# Patient Record
Sex: Male | Born: 1955 | Race: White | Hispanic: No | Smoking: Never smoker
Health system: Southern US, Community
[De-identification: ages and names within clinical notes are randomized; demographics above are authoritative.]

## PROBLEM LIST (undated history)

## (undated) DIAGNOSIS — I4891 Unspecified atrial fibrillation: Secondary | ICD-10-CM

## (undated) DIAGNOSIS — E079 Disorder of thyroid, unspecified: Secondary | ICD-10-CM

## (undated) DIAGNOSIS — E559 Vitamin D deficiency, unspecified: Secondary | ICD-10-CM

## (undated) DIAGNOSIS — E785 Hyperlipidemia, unspecified: Secondary | ICD-10-CM

## (undated) DIAGNOSIS — G4733 Obstructive sleep apnea (adult) (pediatric): Secondary | ICD-10-CM

## (undated) HISTORY — PX: TONSILLECTOMY: SUR1361

## (undated) HISTORY — PX: EYE SURGERY: SHX253

---

## 2008-12-16 LAB — HM COLONOSCOPY

## 2014-09-14 ENCOUNTER — Emergency Department
Admission: EM | Admit: 2014-09-14 | Discharge: 2014-09-14 | Disposition: A | Discharge status: Home / Self Care | Payer: Federal, State, Local not specified - PPO | Source: Home / Self Care | Attending: Emergency Medicine | Admitting: Emergency Medicine

## 2014-09-14 ENCOUNTER — Encounter: Payer: Self-pay | Admitting: *Deleted

## 2014-09-14 DIAGNOSIS — R42 Dizziness and giddiness: Secondary | ICD-10-CM

## 2014-09-14 DIAGNOSIS — J301 Allergic rhinitis due to pollen: Secondary | ICD-10-CM | POA: Diagnosis not present

## 2014-09-14 HISTORY — DX: Disorder of thyroid, unspecified: E07.9

## 2014-09-14 HISTORY — DX: Hyperlipidemia, unspecified: E78.5

## 2014-09-14 MED ORDER — PREDNISONE 50 MG PO TABS: 50.0000 mg | ORAL_TABLET | Freq: Every day | ORAL | Status: DC

## 2014-09-14 NOTE — ED Notes (Signed)
Pt c/o sneezing, nasal congestion x 4 days. Lst night had episode of dizziness and heart racing. Resolved today.

## 2014-09-14 NOTE — ED Provider Notes (Signed)
CSN: [id number]     Arrival date & time 09/14/14  1514  [facility]   First MD Initiated Contact with Patient 09/14/14 1515     Chief Complaint  Patient presents with  . Nasal Congestion  . Dizziness    HPI He has chronic seasonal sinus allergies, but complains of a severe flareup of seasonal allergies for 4 days.  Complains of sneezing, nasal congestion, watery rhinorrhea and postnasal drainage and occasional nonproductive cough from the postnasal drainage. No chest pain or shortness of breath or chest congestion. Last night, had a vague episode while sitting down of feeling his heart racing and then lightheadedness when standing up. No nausea or vomiting or chest pain or focal neurologic symptoms. No problems talking or seeing or moving. No numbness or tingling or focal weakness. No fever or chills. No discolored rhinorrhea. No tick bite or rash. He states his PCP recently retired, and he plans to establish with PCP at [name] Past Medical History  Diagnosis Date  . Hyperlipidemia   . Thyroid disease    Past Surgical History  Procedure Laterality Date  . Tonsillectomy     Family History  Problem Relation Age of Onset  . Heart disease Mother   . Cancer Brother     throat CA   History  Substance Use Topics  . Smoking status: Never Smoker   . Smokeless tobacco: Never Used  . Alcohol Use: Yes    Review of Systems  All other systems reviewed and are negative.   Allergies  Penicillins  Home Medications   Prior to Admission medications   Medication Sig Start Date End Date Taking? Authorizing Provider  atorvastatin (LIPITOR) 20 MG tablet Take 20 mg by mouth daily.   Yes Historical Provider, MD  cetirizine (ZYRTEC) 10 MG tablet Take 10 mg by mouth daily.   Yes Historical Provider, MD  levothyroxine (SYNTHROID, LEVOTHROID) 100 MCG tablet Take 100 mcg by mouth daily before breakfast.   Yes Historical Provider, MD  predniSONE  (DELTASONE) 50 MG tablet Take 1 tablet (50 mg total) by mouth daily. With food for 5 days. 09/14/14   Lajean Manesavid Massey, MD   BP 116/75 mmHg  Pulse 81  Temp(Src) 98.3 F (36.8 C) (Oral)  Resp 16  Ht 6' (1.829 m)  Wt 222 lb (100.699 kg)  BMI 30.10 kg/m2  SpO2 96% Physical Exam  Constitutional: He is oriented to person, place, and time. He appears well-developed and well-nourished. No distress.  HENT:  Head: Normocephalic and atraumatic.  Right Ear: External ear normal.  Left Ear: External ear normal.  Mouth/Throat: Oropharynx is clear and moist.  Both ear canals have minimal wax. Otherwise ears and TMs normal. Nose: Boggy pale turbinates, serous drainage. No maxillary tenderness Pharynx clear with mild lymphoid hyperplasia and serous postnasal drainage  Eyes: Conjunctivae and EOM are normal. Pupils are equal, round, and reactive to light. No scleral icterus.  Neck: Normal range of motion. No JVD present. No tracheal deviation present.  Cardiovascular: Normal rate, regular rhythm, normal heart sounds and intact distal pulses.  Exam reveals no gallop and no friction rub.   No murmur heard. Pulmonary/Chest: Effort normal and breath sounds normal. No respiratory distress. He has no wheezes. He has no rales.  Abdominal: He exhibits no distension.  Musculoskeletal: Normal range of motion.  Lymphadenopathy:    He has no cervical adenopathy.  Neurological: He is alert and oriented to person, place, and time. He has  normal reflexes. No cranial nerve deficit. He exhibits normal muscle tone. Coordination normal.  Romberg negative  Skin: Skin is warm. No rash noted. He is not diaphoretic.  Psychiatric: He has a normal mood and affect. His behavior is normal. Judgment and thought content normal.  Nursing note and vitals reviewed.   ED Course  Procedures (including critical care time) Labs Review Labs Reviewed - No data to display  Imaging Review No results found.   MDM   1. Allergic  rhinitis due to pollen   2. Dizziness    The dizziness episode yesterday has resolved. No current dizziness. No evidence of acute cardiorespiratory or neurologic event. Likely, symptoms related to severe sinus allergies. No evidence of sinus infection.  Treatment options discussed, as well as risks, benefits, alternatives. Patient voiced understanding and agreement with the following plans: Prednisone 50 mg by mouth daily 5 days. Continue the Zyrtec 10 mg daily. Start Flonase 1 or 2 sprays each nostril twice a day 7 days then decrease to once daily.--He declined prescription and prefers to purchase this OTC. Follow-up with your new primary care doctor in 5-7 days if not improving, or sooner if symptoms become worse. Precautions discussed. Red flags discussed. Questions invited and answered. Patient voiced understanding and agreement.     Lajean Manes, MD 09/14/14 1600

## 2014-09-21 ENCOUNTER — Encounter: Payer: Self-pay | Admitting: Sports Medicine

## 2014-09-21 ENCOUNTER — Ambulatory Visit (INDEPENDENT_AMBULATORY_CARE_PROVIDER_SITE_OTHER): Payer: Federal, State, Local not specified - PPO

## 2014-09-21 ENCOUNTER — Ambulatory Visit (INDEPENDENT_AMBULATORY_CARE_PROVIDER_SITE_OTHER): Payer: Federal, State, Local not specified - PPO | Admitting: Sports Medicine

## 2014-09-21 VITALS — BP 122/76 | HR 77 | Ht 72.0 in | Wt 226.0 lb

## 2014-09-21 DIAGNOSIS — M545 Low back pain: Secondary | ICD-10-CM

## 2014-09-21 DIAGNOSIS — M51369 Other intervertebral disc degeneration, lumbar region without mention of lumbar back pain or lower extremity pain: Secondary | ICD-10-CM | POA: Insufficient documentation

## 2014-09-21 DIAGNOSIS — M5136 Other intervertebral disc degeneration, lumbar region: Secondary | ICD-10-CM | POA: Insufficient documentation

## 2014-09-21 MED ORDER — METHYLPREDNISOLONE SODIUM SUCC 125 MG IJ SOLR
125.0000 mg | Freq: Once | INTRAMUSCULAR | Status: AC
  Administered 2014-09-21: 125 mg via INTRAMUSCULAR

## 2014-09-21 MED ORDER — KETOROLAC TROMETHAMINE 30 MG/ML IJ SOLN
30.0000 mg | Freq: Once | INTRAMUSCULAR | Status: AC
  Administered 2014-09-21: 30 mg via INTRAMUSCULAR

## 2014-09-21 MED ORDER — AMITRIPTYLINE HCL 50 MG PO TABS: ORAL_TABLET | ORAL | Status: DC

## 2014-09-21 NOTE — Assessment & Plan Note (Signed)
Likely discogenic back pain, left-sided. Solu-Medrol 125, Toradol 30, Mobic, amitriptyline at bedtime, formal physical therapy, x-rays. Return in one month, MRI if no better.

## 2014-09-21 NOTE — Progress Notes (Signed)
   Subjective:    I'm seeing this patient as a consultation for:   Dr. Lajean Manesavid Massey  CC: Low back pain  HPI: Last week this pleasant 59 year old male bent forward to pull on his shoe, felt a pop in his back and had immediate pain, severe, persistent, left-sided without radiculopathy. Worse with sitting in flexion. He was on some prednisone for an allergic complaint without any improvement. No bowel or bladder dysfunction, saddle numbness or constitutional symptoms.  Past medical history, Surgical history, Family history not pertinant except as noted below, Social history, Allergies, and medications have been entered into the medical record, reviewed, and no changes needed.   Review of Systems: No headache, visual changes, nausea, vomiting, diarrhea, constipation, dizziness, abdominal pain, skin rash, fevers, chills, night sweats, weight loss, swollen lymph nodes, body aches, joint swelling, muscle aches, chest pain, shortness of breath, mood changes, visual or auditory hallucinations.   Objective:   General: Well Developed, well nourished, and in no acute distress.  Neuro/Psych: Alert and oriented x3, extra-ocular muscles intact, able to move all 4 extremities, sensation grossly intact. Skin: Warm and dry, no rashes noted.  Respiratory: Not using accessory muscles, speaking in full sentences, trachea midline.  Cardiovascular: Pulses palpable, no extremity edema. Abdomen: Does not appear distended. Back Exam:  Inspection: Unremarkable  Motion: Flexion 45 deg, Extension 45 deg, Side Bending to 45 deg bilaterally,  Rotation to 45 deg bilaterally  SLR laying: Negative  XSLR laying: Negative  Palpable tenderness: None. FABER: negative. Sensory change: Gross sensation intact to all lumbar and sacral dermatomes.  Reflexes: 2+ at both patellar tendons, 2+ at achilles tendons, Babinski's downgoing.  Strength at foot  Plantar-flexion: 5/5 Dorsi-flexion: 5/5 Eversion: 5/5 Inversion: 5/5  Leg  strength  Quad: 5/5 Hamstring: 5/5 Hip flexor: 5/5 Hip abductors: 5/5  Gait unremarkable.  Impression and Recommendations:   This case required medical decision making of moderate complexity.

## 2014-09-23 ENCOUNTER — Ambulatory Visit (INDEPENDENT_AMBULATORY_CARE_PROVIDER_SITE_OTHER): Payer: Federal, State, Local not specified - PPO | Admitting: Physical Therapy

## 2014-09-23 ENCOUNTER — Encounter: Payer: Self-pay | Admitting: Physical Therapy

## 2014-09-23 DIAGNOSIS — R29898 Other symptoms and signs involving the musculoskeletal system: Secondary | ICD-10-CM | POA: Diagnosis not present

## 2014-09-23 DIAGNOSIS — M545 Low back pain, unspecified: Secondary | ICD-10-CM

## 2014-09-23 DIAGNOSIS — M2560 Stiffness of unspecified joint, not elsewhere classified: Secondary | ICD-10-CM | POA: Diagnosis not present

## 2014-09-23 NOTE — Therapy (Addendum)
[place], [place], [address] Phone: [phone]   Fax:  [phone]  Physical Therapy Evaluation  Patient Details  Name: William Morton MRN: [id number] Date of Birth: 05-11-1955 Referring Provider:  Silverio Decamp,*  Encounter Date: 09/23/2014      PT End of Session - 09/27/14 0830    Visit Number 2   Number of Visits 8   Date for PT Re-Evaluation 10/21/14   PT Start Time 0802   Activity Tolerance Patient limited by fatigue      Past Medical History  Diagnosis Date  . Hyperlipidemia   . Thyroid disease     Past Surgical History  Procedure Laterality Date  . Tonsillectomy      There were no vitals filed for this visit.  Visit Diagnosis:  Pain of lumbar spine - Plan: PT plan of care cert/re-cert  Weakness of left hip - Plan: PT plan of care cert/re-cert  Stiffness of joints, multiple sites - Plan: PT plan of care cert/re-cert      Subjective Assessment - 09/27/14 0808    Subjective Pt reports he is feeling better, however he is being very careful out of caution   Patient Stated Goals wishes to return to his prior level of function without pain. Work his physical jobs, Animal nutritionist.    Currently in Pain? No/denies  has not had pain for atleast 2 days.            [facility] PT Assessment - 09/27/14 0001    Assessment   Medical Diagnosis low back pain without sciatica   Onset Date/Surgical Date 09/16/14   Hand Dominance Right   Next MD Visit 10/20/14   Prior Therapy none   Strength   Right Hip Extension 5/5   Left Hip Extension 5/5   Palpation   Spinal mobility good mobility today with no pain in lower lumbar spine                   [facility] Adult PT Treatment/Exercise - 09/27/14 0001    Lumbar Exercises: Stretches   Passive Hamstring Stretch 2 reps  45 secs   Lower Trunk Rotation --  10 reps   Lumbar Exercises: Aerobic   UBE (Upper Arm Bike)  standing L3 x 2'/3'   Lumbar Exercises: Supine   Other Supine Lumbar Exercises pelvic press series 10 reps each per handout                PT Education - 09/27/14 0827    Education provided Yes   Education Details HEP pelvic press series   Person(s) Educated Patient   Methods Explanation;Handout   Comprehension Verbalized understanding;Returned demonstration             PT Long Term Goals - 09/27/14 0831    PT LONG TERM GOAL #1   Title I with advanced HEP ( 78/16)   Status On-going   PT LONG TERM GOAL #2   Title transition sit to/from stand without pain and no need for UE assist (10/21/14)   Status Achieved   PT LONG TERM GOAL #3   Title lift 50# mid shin to waist without difficulty for his job   Status On-going   PT LONG TERM GOAL #4   Title slide 50# at waist level side to side without difficulty for his job (10/21/14)   Status On-going   PT LONG TERM GOAL #5   Title improve FOTO =/< 30% limited (10/21/14)  Status On-going   PT LONG TERM GOAL #6   Title increase strength Lt hip extension =/> 5-/5    Status Achieved               Plan - 09/27/14 0832    Clinical Impression Statement Pt has responded very well to his stretching exercise. He has good reduction of pain, weaned off medication and met two of his goals.  He is scheduled to return to work on Thursday and will try to take it easy.    Pt will benefit from skilled therapeutic intervention in order to improve on the following deficits Pain;Decreased strength;Decreased mobility;Postural dysfunction   Rehab Potential Excellent   PT Frequency 2x / week   PT Duration 4 weeks   PT Treatment/Interventions Moist Heat;Ultrasound;Cryotherapy;Electrical Stimulation;Patient/family education;Manual techniques;Therapeutic exercise   PT Next Visit Plan progress lifting, core strengthening   Consulted and Agree with Plan of Care Patient         Problem List Patient Active Problem List   Diagnosis Date  Noted  . Low back pain 09/21/2014    Jeral Pinch, PT 09/27/2014, 8:44 AM  [facility] [place], [place], [address] Phone: [phone]   Fax:  [phone]

## 2014-09-23 NOTE — Patient Instructions (Addendum)
Lower Trunk Rotation Stretch   [place] [id number]   Keeping back flat and feet together, rotate knees to left side. Hold __1__ seconds. Repeat _10___ times per set. Do __1__ sets per session. Do _1-2___ sessions per day.  Knee-to-Chest Stretch: Unilateral   With hand behind right knee, pull knee in to chest until a comfortable stretch is felt in lower back and buttocks. Keep back relaxed. Hold _30___ seconds. Repeat __1__ times per set. Do __1__ sets per session. Do _1-2___ sessions per day.    Outer Hip Stretch: Reclined IT Band Stretch (Strap)   Strap around opposite foot, pull across only as far as possible with shoulders on mat. Hold for __10__ breaths. Repeat _1-2___ times each leg. One to two times a day  Copyright  VHI. All rights reserved.  Log Roll   Lying on back, bend left knee and place left arm across chest. Roll all in one movement to the right. Reverse to roll to the left. Always move as one unit.   Copyright  VHI. All rights reserved.  Getting Into / Out of Bed   Lower self to lie down on one side by raising legs and lowering head at the same time. Use arms to assist moving without twisting. Bend both knees to roll onto back if desired. To sit up, start from lying on side, and use same move-ments in reverse. Keep trunk aligned with legs.   Copyright  VHI. All rights reserved.

## 2014-09-27 ENCOUNTER — Ambulatory Visit (INDEPENDENT_AMBULATORY_CARE_PROVIDER_SITE_OTHER): Payer: Federal, State, Local not specified - PPO | Admitting: Physical Therapy

## 2014-09-27 ENCOUNTER — Encounter: Payer: Self-pay | Admitting: Physical Therapy

## 2014-09-27 DIAGNOSIS — M545 Low back pain, unspecified: Secondary | ICD-10-CM

## 2014-09-27 DIAGNOSIS — R29898 Other symptoms and signs involving the musculoskeletal system: Secondary | ICD-10-CM

## 2014-09-27 DIAGNOSIS — M2560 Stiffness of unspecified joint, not elsewhere classified: Secondary | ICD-10-CM | POA: Diagnosis not present

## 2014-09-27 NOTE — Therapy (Signed)
[place], [place], [address] Phone: [phone]   Fax:  [phone]  Physical Therapy Treatment  Patient Details  Name: William Morton MRN: [id number] Date of Birth: January 19, 1956 Referring Provider:  Silverio Decamp,*  Encounter Date: 09/27/2014      PT End of Session - 09/27/14 0830    Visit Number 2   Number of Visits 8   Date for PT Re-Evaluation 10/21/14   PT Start Time 0802   PT Stop Time 0848   PT Time Calculation (min) 46 min   Activity Tolerance Patient limited by fatigue      Past Medical History  Diagnosis Date  . Hyperlipidemia   . Thyroid disease     Past Surgical History  Procedure Laterality Date  . Tonsillectomy      There were no vitals filed for this visit.  Visit Diagnosis:  Pain of lumbar spine  Weakness of left hip  Stiffness of joints, multiple sites      Subjective Assessment - 09/27/14 0808    Subjective Pt reports he is feeling better, however he is being very careful out of caution   Patient Stated Goals wishes to return to his prior level of function without pain. Work his physical jobs, Animal nutritionist.    Currently in Pain? No/denies  has not had pain for atleast 2 days.            [facility] PT Assessment - 09/27/14 0001    Assessment   Medical Diagnosis low back pain without sciatica   Onset Date/Surgical Date 09/16/14   Hand Dominance Right   Next MD Visit 10/20/14   Prior Therapy none   Strength   Right Hip Extension 5/5   Left Hip Extension 5/5   Palpation   Spinal mobility good mobility today with no pain in lower lumbar spine                     [facility] Adult PT Treatment/Exercise - 09/27/14 0001    Lumbar Exercises: Stretches   Passive Hamstring Stretch 2 reps  45 secs   Lower Trunk Rotation --  10 reps   Lumbar Exercises: Aerobic   UBE (Upper Arm Bike) standing L3 x 2'/3'   Lumbar Exercises: Supine   Other  Supine Lumbar Exercises pelvic press series 10 reps each per handout                PT Education - 09/27/14 0827    Education provided Yes   Education Details HEP pelvic press series, reviewed body mechanics and hinging at the hips   Person(s) Educated Patient   Methods Explanation;Handout   Comprehension Verbalized understanding;Returned demonstration             PT Long Term Goals - 09/27/14 0831    PT LONG TERM GOAL #1   Title I with advanced HEP ( 78/16)   Status On-going   PT LONG TERM GOAL #2   Title transition sit to/from stand without pain and no need for UE assist (10/21/14)   Status Achieved   PT LONG TERM GOAL #3   Title lift 50# mid shin to waist without difficulty for his job   Status On-going   PT LONG TERM GOAL #4   Title slide 50# at waist level side to side without difficulty for his job (10/21/14)   Status On-going   PT LONG TERM GOAL #5   Title improve FOTO =/<  30% limited (10/21/14)   Status On-going   PT LONG TERM GOAL #6   Title increase strength Lt hip extension =/> 5-/5    Status Achieved               Plan - 09/27/14 0832    Clinical Impression Statement Pt has responded very well to his stretching exercise. He has good reduction of pain, weaned off medication and met two of his goals.  He is scheduled to return to work on Thursday and will try to take it easy.  Did not need heat and stim today.    Pt will benefit from skilled therapeutic intervention in order to improve on the following deficits Pain;Decreased strength;Decreased mobility;Postural dysfunction   Rehab Potential Excellent   PT Frequency 2x / week   PT Duration 4 weeks   PT Treatment/Interventions Moist Heat;Ultrasound;Cryotherapy;Electrical Stimulation;Patient/family education;Manual techniques;Therapeutic exercise   PT Next Visit Plan progress lifting, core strengthening, see how he feels after returning to work   [employer] and Agree with Plan of Care Patient         Problem List Patient Active Problem List   Diagnosis Date Noted  . Low back pain 09/21/2014    Jeral Pinch, PT 09/27/2014, 8:49 AM  [facility] [place], [place], [address] Phone: [phone]   Fax:  [phone]

## 2014-09-27 NOTE — Patient Instructions (Signed)
Pelvic Press   [place] [phone]   Place hands under belly between navel and pubic bone, palms up. Feel pressure on hands. Increase pressure on hands by pressing pelvis down. This is NOT a pelvic tilt. Hold __5_ seconds. Relax. Repeat _10__ times. Once a day  Hamstring Curl - Beginner   Prone with head on forearms, press down with hips. Bend knees  as far as you can maintain pelvic press and return them down. Repeat __10__ times. Once a day.   Copyright  VHI. All rights reserved.  Leg Lift: One-Leg   Press pelvis down. Keep knee straight; lengthen and lift one leg (from waist). Do not twist body. Keep other leg down. Hold _1__ seconds. Relax. Repeat 10 times. Repeat with other leg  Shoulder Blade Squeeze: Arms at Sides   Arms at sides, parallel, elbows straight, palms up. Press pelvis down. Squeeze backbone with shoulder blades, raising front of shoulders, chest, and arms. Keep head and neck neutral. Repeat 5-10 times. Once a day.  Shoulder Blade Squeeze: Fingers Interlaced   Fingers interlaced behind your body, palms facing each other. Press pelvis down. Squeeze backbone with shoulder blades. Raise front of shoulders, chest, and head. Keep neck neutral. Hold _1__ seconds. Relax. Repeat _5-10__ times. Once a day  Shoulder Blade Squeeze: W   Arms out to sides at 90 palms down. Bend elbows to 90. Press pelvis down. Squeeze backbone with shoulder blades. Raise arms, front of shoulders, chest, and head. Keep neck neutral. Hold __1_ seconds. Relax. Repeat 5-10___ times. Once a day  Copyright  VHI. All rights reserved.

## 2014-09-30 ENCOUNTER — Ambulatory Visit (INDEPENDENT_AMBULATORY_CARE_PROVIDER_SITE_OTHER): Payer: Federal, State, Local not specified - PPO | Admitting: Physical Therapy

## 2014-09-30 DIAGNOSIS — M545 Low back pain, unspecified: Secondary | ICD-10-CM

## 2014-09-30 DIAGNOSIS — M2560 Stiffness of unspecified joint, not elsewhere classified: Secondary | ICD-10-CM | POA: Diagnosis not present

## 2014-09-30 DIAGNOSIS — R29898 Other symptoms and signs involving the musculoskeletal system: Secondary | ICD-10-CM

## 2014-09-30 NOTE — Patient Instructions (Signed)
  Abdominal Bracing With Pelvic Floor (Hook-Lying)   With neutral spine, tighten pelvic floor and abdominals. Hold 10 seconds. Repeat __10_ times. Do _1__ times a day.   Knee to Chest: Transverse Plane Stability   Bring one knee up, then return. Be sure pelvis does not roll side to side. Keep pelvis still. Lift knee __10_ times each leg. Restabilize pelvis. Repeat with other leg. Do _1-2__ sets, _1__ times per day.   Hip External Rotation With Pillow: Transverse Plane Stability   One knee bent, one leg straight, on pillow. Slowly roll bent knee out. Be sure pelvis does not rotate. Do _10__ times. Restabilize pelvis. Repeat with other leg. Do _1-2__ sets, _1__ times per day.  Heel Slide: 4-10 Inches - Transverse Plane Stability   Slide heel 4 inches down. Be sure pelvis does not rotate. Do _10__ times. Restabilize pelvis. Repeat with other leg. Do __1_ sets, _1__ times per day.   [facility] [address] [place],  [address]  [phone] (office) [phone] (fax)   

## 2014-09-30 NOTE — Therapy (Signed)
[facility] [place] [address] [place], [place], [address] Phone: [phone]   Fax:  [phone]  Physical Therapy Treatment  Patient Details  Name: William Morton MRN: [id number] Date of Birth: 26-Mar-1956 Referring Provider:  Monica Becton,*  Encounter Date: 09/30/2014      PT End of Session - 09/30/14 0843    Visit Number 3   Number of Visits 8   Date for PT Re-Evaluation 10/21/14   PT Start Time 0844   PT Stop Time 0930   PT Time Calculation (min) 46 min   Activity Tolerance No increased pain;Patient tolerated treatment well      Past Medical History  Diagnosis Date  . Hyperlipidemia   . Thyroid disease     Past Surgical History  Procedure Laterality Date  . Tonsillectomy      There were no vitals filed for this visit.  Visit Diagnosis:  Stiffness of joints, multiple sites  Weakness of left hip  Pain of lumbar spine      Subjective Assessment - 09/30/14 0845    Subjective Pt returned to work yesterday, worked for 12 hrs. Was very cautious; only picked up 15#. No exercises yesterday, just stretched before exited bed this morning.    Currently in Pain? No/denies  stiffness.             [facility] PT Assessment - 09/30/14 0001    Assessment   Medical Diagnosis low back pain without sciatica   Onset Date/Surgical Date 09/16/14   Hand Dominance Right   Next MD Visit 10/20/14   Prior Therapy none                     [facility] Adult PT Treatment/Exercise - 09/30/14 0001    Lumbar Exercises: Stretches   Passive Hamstring Stretch 2 reps;30 seconds  each leg   Lower Trunk Rotation --  10 reps   ITB Stretch 30 seconds  with strap cross body   Lumbar Exercises: Aerobic   Stationary Bike NuStep L 4: 5 min    Lumbar Exercises: Standing   Other Standing Lumbar Exercises shin to waist box lift 20 # x 2, 47# x 2.  Waist to waist box transfer 20# x 2, 47# x 2.  Box slide  37ft 20# x 2, 47# x  2. - no pain with any of these movements.  Pt return demo of good form/ mechanics. VC to engage core.    Lumbar Exercises: Supine   Ab Set 5 reps;5 seconds   Clam 10 reps  with ab set   Heel Slides 5 reps  each leg, with ab set   Bent Knee Raise 10 reps  with abd set   Lumbar Exercises: Prone   Other Prone Lumbar Exercises Prone pelvic press 5 sec hold x 5 (required multiple tactile cues);  press withunilateral knee flexion x 5 reps each leg; press with Bilat knee flex x 5; press with hip extension x 5 reps each leg.                 PT Education - 09/30/14 0927    Education provided Yes   Education Details HEP, added trans abd series.  Reviewed body mechanics.    Person(s) Educated Patient   Methods Handout;Explanation   Comprehension Verbalized understanding;Returned demonstration             PT Long Term Goals - 09/27/14 0831    PT LONG TERM GOAL #1  Title I with advanced HEP ( 78/16)   Status On-going   PT LONG TERM GOAL #2   Title transition sit to/from stand without pain and no need for UE assist (10/21/14)   Status Achieved   PT LONG TERM GOAL #3   Title lift 50# mid shin to waist without difficulty for his job   Status On-going   PT LONG TERM GOAL #4   Title slide 50# at waist level side to side without difficulty for his job (10/21/14)   Status On-going   PT LONG TERM GOAL #5   Title improve FOTO =/< 30% limited (10/21/14)   Status On-going   PT LONG TERM GOAL #6   Title increase strength Lt hip extension =/> 5-/5    Status Achieved               Plan - 09/30/14 0916    Clinical Impression Statement Pt able to lift 47# box and shift it from side to side, without reproduction of symptoms.  Pt demonstrated good body mechanics with lifting.  Pt required some tactile cues for multifidus engagment. Progressing very well towards remaining goals.    Pt will benefit from skilled therapeutic intervention in order to improve on the following deficits  Pain;Decreased strength;Decreased mobility;Postural dysfunction   Rehab Potential Excellent   PT Frequency 2x / week   PT Duration 4 weeks   PT Treatment/Interventions Moist Heat;Ultrasound;Cryotherapy;Electrical Stimulation;Patient/family education;Manual techniques;Therapeutic exercise   PT Next Visit Plan Progress HEP, core strengthening.    Consulted and Agree with Plan of Care Patient        Problem List Patient Active Problem List   Diagnosis Date Noted  . Low back pain 09/21/2014    Mayer Camel, PTA 09/30/2014 9:29 AM   [facility] [address] [place], [place], [address] Phone: [phone]   Fax:  [phone]

## 2014-10-04 ENCOUNTER — Encounter: Payer: Federal, State, Local not specified - PPO | Admitting: Physical Therapy

## 2014-10-07 ENCOUNTER — Encounter: Payer: Self-pay | Admitting: Physical Therapy

## 2014-10-07 ENCOUNTER — Ambulatory Visit (INDEPENDENT_AMBULATORY_CARE_PROVIDER_SITE_OTHER): Payer: Federal, State, Local not specified - PPO | Admitting: Physical Therapy

## 2014-10-07 DIAGNOSIS — M2560 Stiffness of unspecified joint, not elsewhere classified: Secondary | ICD-10-CM | POA: Diagnosis not present

## 2014-10-07 DIAGNOSIS — M545 Low back pain, unspecified: Secondary | ICD-10-CM

## 2014-10-07 DIAGNOSIS — R29898 Other symptoms and signs involving the musculoskeletal system: Secondary | ICD-10-CM | POA: Diagnosis not present

## 2014-10-07 NOTE — Therapy (Signed)
[facility] [address] [place], [place], [address] Phone: [phone]   Fax:  [phone]  Physical Therapy Treatment  Patient Details  Name: William Morton MRN: [id number] Date of Birth: 1956/01/05 Referring Provider:  Monica Becton,*  Encounter Date: 10/07/2014      PT End of Session - 10/07/14 0810    Visit Number 4   Number of Visits 8   Date for PT Re-Evaluation 10/21/14   PT Start Time 0803   PT Stop Time 0845   PT Time Calculation (min) 42 min      Past Medical History  Diagnosis Date  . Hyperlipidemia   . Thyroid disease     Past Surgical History  Procedure Laterality Date  . Tonsillectomy      There were no vitals filed for this visit.  Visit Diagnosis:  Stiffness of joints, multiple sites  Weakness of left hip  Pain of lumbar spine      Subjective Assessment - 10/07/14 0809    Subjective Pt reports he has had a busy week, was driving a lot yesterday and now the Lt side of his low back has flared up some.    Patient Stated Goals wishes to return to his prior level of function without pain. Work his physical jobs, Engineer, materials.    Pain Score 3    Pain Location Back   Pain Orientation Left;Lower   Pain Descriptors / Indicators Dull   Pain Type Acute pain   Pain Onset In the past 7 days   Aggravating Factors  driving in the car for long distances   Pain Relieving Factors stretching and walking around   Multiple Pain Sites No                         [facility] Adult PT Treatment/Exercise - 10/07/14 0001    Lumbar Exercises: Stretches   Double Knee to Chest Stretch 30 seconds   Standing Side Bend Limitations QL stretch at the sink by creating traction through the spine 2   Lumbar Exercises: Aerobic   Stationary Bike L2x6'   Lumbar Exercises: Supine   Ab Set 10 reps;5 seconds   Bridge 10 reps   Bridge Limitations then 10 reps with marching.   Straight Leg  Raise 10 reps  each side, VC for form   Lumbar Exercises: Prone   Other Prone Lumbar Exercises reviewed pelvic press exercises per pt request   Other Prone Lumbar Exercises pelvic press with bent knee lifts, then upper body lifts                     PT Long Term Goals - 10/07/14 0388    PT LONG TERM GOAL #1   Title I with advanced HEP ( 78/16)   Status On-going   PT LONG TERM GOAL #2   Title transition sit to/from stand without pain and no need for UE assist (10/21/14)   Status Achieved   PT LONG TERM GOAL #3   Title lift 50# mid shin to waist without difficulty for his job   Status On-going   PT LONG TERM GOAL #4   Title slide 50# at waist level side to side without difficulty for his job (10/21/14)   Status On-going   PT LONG TERM GOAL #5   Title improve FOTO =/< 30% limited (10/21/14)   Status On-going  Plan - 10/07/14 0811    Clinical Impression Statement Pt will stay on caseload for a little longer as his pain has returned with him going back to work. Continues to have weakness in his core that cause instability in his spine and triggers muscle spasms   Pt will benefit from skilled therapeutic intervention in order to improve on the following deficits Pain;Decreased strength;Decreased mobility;Postural dysfunction   Rehab Potential Excellent   PT Frequency 2x / week   PT Duration 4 weeks   PT Treatment/Interventions Moist Heat;Ultrasound;Cryotherapy;Electrical Stimulation;Patient/family education;Manual techniques;Therapeutic exercise   PT Next Visit Plan core strengthening    Consulted and Agree with Plan of Care Patient        Problem List Patient Active Problem List   Diagnosis Date Noted  . Low back pain 09/21/2014    Roderic Scarce, PT 10/07/2014, 8:50 AM  [facility] [address] [place], [place], [address] Phone: [phone]   Fax:  [phone]

## 2014-10-12 ENCOUNTER — Ambulatory Visit (INDEPENDENT_AMBULATORY_CARE_PROVIDER_SITE_OTHER): Payer: Federal, State, Local not specified - PPO | Admitting: Physical Therapy

## 2014-10-12 DIAGNOSIS — M2560 Stiffness of unspecified joint, not elsewhere classified: Secondary | ICD-10-CM | POA: Diagnosis not present

## 2014-10-12 DIAGNOSIS — R29898 Other symptoms and signs involving the musculoskeletal system: Secondary | ICD-10-CM | POA: Diagnosis not present

## 2014-10-12 DIAGNOSIS — M545 Low back pain, unspecified: Secondary | ICD-10-CM

## 2014-10-12 NOTE — Therapy (Signed)
[place], [place], [address] Phone: [phone]   Fax:  [phone]  Physical Therapy Treatment  Patient Details  Name: William Morton MRN: [id number] Date of Birth: 1955/06/04 Referring Provider:  Silverio Decamp,*  Encounter Date: 10/12/2014      PT End of Session - 10/12/14 0923    Visit Number 5   Number of Visits 8   Date for PT Re-Evaluation 10/21/14   PT Start Time 0845   PT Stop Time 0923   PT Time Calculation (min) 38 min   Activity Tolerance No increased pain;Patient tolerated treatment well   Behavior During Therapy [facility] for tasks assessed/performed      Past Medical History  Diagnosis Date  . Hyperlipidemia   . Thyroid disease     Past Surgical History  Procedure Laterality Date  . Tonsillectomy      There were no vitals filed for this visit.  Visit Diagnosis:  Weakness of left hip  Stiffness of joints, multiple sites  Pain of lumbar spine      Subjective Assessment - 10/12/14 0845    Subjective Feeling much better today; no trouble at work.  Returned to full work duty. Reports minor pain with work and virtually no use of meds.    Patient Stated Goals wishes to return to his prior level of function without pain. Work his physical jobs, Animal nutritionist.    Currently in Pain? No/denies            [facility] PT Assessment - 10/12/14 0912    Observation/Other Assessments   Focus on Therapeutic Outcomes (FOTO)  98 (2% limited)   Strength   Right Hip Extension 5/5   Left Hip Extension 5/5                     [facility] Adult PT Treatment/Exercise - 10/12/14 0850    Lumbar Exercises: Stretches   ITB Stretch 30 seconds   ITB Stretch Limitations bil   Lumbar Exercises: Aerobic   Stationary Bike Level 6 x 6 min   Lumbar Exercises: Standing   Other Standing Lumbar Exercises box lifts ~ 53#: shin to waist x2; waist to waist x2 without increase in pain  and proper demonstration of technique   Other Standing Lumbar Exercises sit to stand without UE support without pain   Lumbar Exercises: Prone   Other Prone Lumbar Exercises reviewed pelvic press exercises per pt request   Other Prone Lumbar Exercises pelvic press with bent knee lifts, then upper body lifts                PT Education - 10/12/14 0922    Education provided Yes   Education Details current progress towards goals (goals met), readiness for d/c and pt ready for d/c   Person(s) Educated Patient   Methods Explanation   Comprehension Verbalized understanding             PT Long Term Goals - 10/12/14 6948    PT LONG TERM GOAL #1   Title I with advanced HEP ( 78/16)   Status Achieved   PT LONG TERM GOAL #2   Title transition sit to/from stand without pain and no need for UE assist (10/21/14)   Status Achieved   PT LONG TERM GOAL #3   Title lift 50# mid shin to waist without difficulty for his job   Status Achieved   PT LONG TERM GOAL #4   Title  slide 50# at waist level side to side without difficulty for his job (10/21/14)   Status Achieved   PT LONG TERM GOAL #5   Title improve FOTO =/< 30% limited (10/21/14)   Status Achieved   PT LONG TERM GOAL #6   Title increase strength Lt hip extension =/> 5-/5    Status Achieved               Plan - 10/12/14 0923    Clinical Impression Statement Pt feels confident with home program and ready to transition to HEP.  All goals met and pt has returned to work full duty without pain.  Ready for d/c.   PT Next Visit Plan d/c PT today   Consulted and Agree with Plan of Care Patient        Problem List Patient Active Problem List   Diagnosis Date Noted  . 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  Patient goals were met. Patient is being discharged due to meeting the stated rehab goals.  ?????    Laureen Abrahams, PT, DPT 10/12/2014 9:28 AM   [facility] [place], [place] [address]  [phone] (office) [phone] (fax)

## 2014-10-24 ENCOUNTER — Encounter: Payer: Self-pay | Admitting: Sports Medicine

## 2014-10-24 ENCOUNTER — Ambulatory Visit (INDEPENDENT_AMBULATORY_CARE_PROVIDER_SITE_OTHER): Payer: Federal, State, Local not specified - PPO | Admitting: Sports Medicine

## 2014-10-24 VITALS — BP 124/72 | HR 82 | Ht 72.0 in | Wt 226.0 lb

## 2014-10-24 DIAGNOSIS — M5136 Other intervertebral disc degeneration, lumbar region: Secondary | ICD-10-CM | POA: Diagnosis not present

## 2014-10-24 DIAGNOSIS — M51369 Other intervertebral disc degeneration, lumbar region without mention of lumbar back pain or lower extremity pain: Secondary | ICD-10-CM

## 2014-10-24 NOTE — Assessment & Plan Note (Signed)
Symptoms completed a resolved, return as needed.

## 2014-10-24 NOTE — Progress Notes (Signed)
  Subjective:    CC: follow-up  HPI: William Morton returns, he has lumbar degenerative disc disease that responded extremely well to relatively aggressive noninvasive intervention, and physical therapy. He has stopped all of his medicines, and continues with his home exercise program and is happy with how things are going so far.  Past medical history, Surgical history, Family history not pertinant except as noted below, Social history, Allergies, and medications have been entered into the medical record, reviewed, and no changes needed.   Review of Systems: No fevers, chills, night sweats, weight loss, chest pain, or shortness of breath.   Objective:    General: Well Developed, well nourished, and in no acute distress.  Neuro: Alert and oriented x3, extra-ocular muscles intact, sensation grossly intact.  HEENT: Normocephalic, atraumatic, pupils equal round reactive to light, neck supple, no masses, no lymphadenopathy, thyroid nonpalpable.  Skin: Warm and dry, no rashes. Cardiac: Regular rate and rhythm, no murmurs rubs or gallops, no lower extremity edema.  Respiratory: Clear to auscultation bilaterally. Not using accessory muscles, speaking in full sentences. Back Exam:  Inspection: Unremarkable  Motion: Flexion 45 deg, Extension 45 deg, Side Bending to 45 deg bilaterally,  Rotation to 45 deg bilaterally  SLR laying: Negative  XSLR laying: Negative  Palpable tenderness: None. FABER: negative. Sensory change: Gross sensation intact to all lumbar and sacral dermatomes.  Reflexes: 2+ at both patellar tendons, 2+ at achilles tendons, Babinski's downgoing.  Strength at foot  Plantar-flexion: 5/5 Dorsi-flexion: 5/5 Eversion: 5/5 Inversion: 5/5  Leg strength  Quad: 5/5 Hamstring: 5/5 Hip flexor: 5/5 Hip abductors: 5/5  Gait unremarkable.  Impression and Recommendations:

## 2015-03-16 ENCOUNTER — Ambulatory Visit (INDEPENDENT_AMBULATORY_CARE_PROVIDER_SITE_OTHER): Payer: Federal, State, Local not specified - PPO | Admitting: Family Medicine

## 2015-03-16 ENCOUNTER — Encounter: Payer: Self-pay | Admitting: Sports Medicine

## 2015-03-16 VITALS — BP 133/70 | HR 67 | Wt 233.0 lb

## 2015-03-16 DIAGNOSIS — E785 Hyperlipidemia, unspecified: Secondary | ICD-10-CM | POA: Insufficient documentation

## 2015-03-16 DIAGNOSIS — Z0000 Encounter for general adult medical examination without abnormal findings: Secondary | ICD-10-CM

## 2015-03-16 DIAGNOSIS — E039 Hypothyroidism, unspecified: Secondary | ICD-10-CM

## 2015-03-16 DIAGNOSIS — E559 Vitamin D deficiency, unspecified: Secondary | ICD-10-CM

## 2015-03-16 DIAGNOSIS — Z23 Encounter for immunization: Secondary | ICD-10-CM | POA: Diagnosis not present

## 2015-03-16 DIAGNOSIS — IMO0001 Reserved for inherently not codable concepts without codable children: Secondary | ICD-10-CM

## 2015-03-16 LAB — LIPID PANEL
CHOL/HDL RATIO: 5.6 ratio — AB (ref <=5.0)
Cholesterol: 156 mg/dL (ref 125–200)
HDL: 28 mg/dL — ABNORMAL LOW (ref >=40)
LDL CALC: 104 mg/dL (ref <130)
Triglycerides: 118 mg/dL (ref <150)
VLDL: 24 mg/dL (ref <30)

## 2015-03-16 LAB — COMPREHENSIVE METABOLIC PANEL
ALT: 21 U/L (ref 9–46)
AST: 24 U/L (ref 10–35)
Albumin: 3.9 g/dL (ref 3.6–5.1)
Alkaline Phosphatase: 72 U/L (ref 40–115)
BUN: 17 mg/dL (ref 7–25)
CALCIUM: 9.1 mg/dL (ref 8.6–10.3)
CHLORIDE: 102 mmol/L (ref 98–110)
CO2: 26 mmol/L (ref 20–31)
Creat: 0.92 mg/dL (ref 0.70–1.33)
GLUCOSE: 81 mg/dL (ref 65–99)
POTASSIUM: 4 mmol/L (ref 3.5–5.3)
Sodium: 138 mmol/L (ref 135–146)
Total Bilirubin: 0.5 mg/dL (ref 0.2–1.2)
Total Protein: 6.8 g/dL (ref 6.1–8.1)

## 2015-03-16 LAB — CBC
HEMATOCRIT: 44.8 % (ref 39.0–52.0)
Hemoglobin: 16 g/dL (ref 13.0–17.0)
MCH: 32.4 pg (ref 26.0–34.0)
MCHC: 35.7 g/dL (ref 30.0–36.0)
MCV: 90.7 fL (ref 78.0–100.0)
MPV: 11.2 fL (ref 8.6–12.4)
PLATELETS: 132 10*3/uL — AB (ref 150–400)
RBC: 4.94 MIL/uL (ref 4.22–5.81)
RDW: 13.4 % (ref 11.5–15.5)
WBC: 5.7 10*3/uL (ref 4.0–10.5)

## 2015-03-16 LAB — TSH: TSH: 6.846 u[IU]/mL — AB (ref 0.350–4.500)

## 2015-03-16 MED ORDER — ATORVASTATIN CALCIUM 20 MG PO TABS: 20.0000 mg | ORAL_TABLET | Freq: Every day | ORAL | Status: DC

## 2015-03-16 MED ORDER — LEVOTHYROXINE SODIUM 100 MCG PO TABS: 100.0000 ug | ORAL_TABLET | Freq: Every day | ORAL | Status: DC

## 2015-03-16 NOTE — Assessment & Plan Note (Signed)
Off of Lipitor for 2 months. Refill Lipitor. Check lipid panel. Recheck 2 months.

## 2015-03-16 NOTE — Assessment & Plan Note (Addendum)
Off of thyroid medicine for 2 months. Refill levothyroxine. check TSH. Check 2 months

## 2015-03-16 NOTE — Assessment & Plan Note (Addendum)
Doing reasonably well. Health maintenance updated. Flu vaccine given today. Discussed advanced directive. Additionally discussed risks versus benefits of prostate cancer screening. Patient declined prostate cancer screening. Obtain routine fasting labs. Recheck in 2 months.

## 2015-03-16 NOTE — Progress Notes (Signed)
William GurneyWillard Morton is a 59 y.o. male who [name]  today for establish care and wellness visit. Patient's doctor retired a few months ago. He has been without his Lipitor and levothyroxine. He feels well with no chest pains palpitations or shortness of breath. His tetanus colonoscopy are up-to-date. He would like a flu vaccine today. He does note that he is due to get cataract surgery in the next month or 2 on both eyes. He does not smoke or drink heavily. He's allergic to penicillin which causes joint swelling as a child. He's never had anaphylaxis to this medication. [place] [employer] [place]    Past Medical History  Diagnosis Date  . Hyperlipidemia   . Thyroid disease    Past Surgical History  Procedure Laterality Date  . Tonsillectomy     Social History  Substance Use Topics  . Smoking status: Never Smoker   . Smokeless tobacco: Never Used  . Alcohol Use: Yes   family history includes Cancer in his brother; Heart disease in his mother.  ROS as above Medications: Current Outpatient Prescriptions  Medication Sig Dispense Refill  . atorvastatin (LIPITOR) 20 MG tablet Take 1 tablet (20 mg total) by mouth daily. 30 tablet 1  . cetirizine (ZYRTEC) 10 MG tablet Take 10 mg by mouth daily.    . fluticasone (FLONASE) 50 MCG/ACT nasal spray Place into both nostrils daily.    Marland Kitchen. levothyroxine (SYNTHROID, LEVOTHROID) 100 MCG tablet Take 1 tablet (100 mcg total) by mouth daily before breakfast. 30 tablet 1   No current facility-administered medications for this visit.   Allergies  Allergen Reactions  . Penicillins      Exam:  BP 133/70 mmHg  Pulse 67  Wt 233 lb (105.688 kg) Gen: Well NAD nontoxic appearing HEENT: EOMI,  MMM Lungs: Normal work of breathing. CTABL Heart: RRR no MRG Abd: NABS, Soft. Nondistended, Nontender Exts: Brisk capillary refill, warm and well perfused.   No results found for this or any previous visit  (from the past 24 hour(s)). No results found.   Please see individual assessment and plan sections.

## 2015-03-16 NOTE — Patient Instructions (Signed)
Thank you for coming in today. Call or go to the emergency room if you get worse, have trouble breathing, have chest pains, or palpitations.  Return in 2 months for recheck labs and cholesterol.   Lipid Profile Test WHY AM I HAVING THIS TEST? The lipid profile test gives results that can help predict the likelihood of developing heart disease. The test is also used to monitor treatment for high cholesterol to see if you are reaching your goals. A lipid profile measures the following:  Total cholesterol. Cholesterol is a waxy fat in your blood. If your total cholesterol is elevated, this can increase your risk of coronary heart disease.  High-density lipoprotein (HDL). This is known as the good cholesterol. Having a high level of HDL is good. Your HDL level may be low if you smoke or do not get enough exercise.  Low-density lipoprotein (LDL). This is known as the bad cholesterol and is responsible for the formation of plaque in the arteries. Having a low level of LDL is best.  Cholesterol to HDL ratio. This is calculated by dividing the total cholesterol by the HDL cholesterol. The ratio is used by health care providers for determining your risk of heart disease. A low ratio is best.  Triglycerides. These are a type of fat in the blood responsible for providing energy to your cells. Low levels are best. WHAT KIND OF SAMPLE IS TAKEN? A blood sample is required for this test. It is usually collected by inserting a needle into a vein. HOW DO I PREPARE FOR THE TEST?  Do not eat or drink anything after midnight on the night before the test or as directed by your health care provider. WHAT ARE THE REFERENCE RANGES? Reference ranges are considered healthy ranges established after testing a large group of healthy people. Reference ranges may vary among different people, labs, and hospitals. It is your responsibility to obtain your test results. Ask the lab or department performing the test when and how  you will get your results. Reference ranges for the lipid profile test are as follows: Total Cholesterol  Adult or elderly: less than 200 mg/dL or less than 1.615.20 mmol/L (SI units).  Child: 120-200 mg/dL.  Infant: 70-175 mg/dL.  Newborns: 53-135 mg/dL. HDL  Male: greater than 45 mg/dL or greater than 0.960.75 mmol/L (SI units).  Male: greater than 55 mg/dL or greater than 0.450.91 mmol/L (SI units). HDL reference values based on risk of heart disease:  For low risk of heart disease:  Male: 60 mg/dL or 4.091.55 mmol/L.  Male: 70 mg/dL or 8.111.81 mmol/L.  For moderate risk of heart disease:  Male: 45 mg/dL or 9.141.17 mmol/L.  Male: 55 mg/dL or 7.821.42 mmol/L.  For high risk of heart disease:  Male: 25 mg/dL or 9.560.65 mmol/L.  Male: 35 mg/dL or 2.130.90 mmol/L. LDL  Adult: less than 130 mg/dL.  Children: less than 110 mg/dL. Cholesterol to HDL Ratio Reference values based on risk for coronary heart disease:  Risk that is one half average:  Male: 3.4.  Male: 3.3.  Average risk:  Male: 5.0.  Male: 4.4.  Risk that is two times average (moderate risk):  Male: 10.0.  Male: 7.0.  Risk that is three times average (high risk):  Male: 24.0.  Male: 11.0. Triglycerides  Adult or elderly:  Male: 40-160 mg/dL or 0.86-5.780.45-1.81 mmol/L (SI units).  Male: 35-135 mg/dL or 4.69-6.290.40-1.52 mmol/L (SI units).  Children 270-57 years old:  Male: 30-86 mg/dL.  Male: 32-99 mg/dL.  Children 16-31 years old:  Male: 31-108 mg/dL.  Male: 35-114 mg/dL.  Children 76-11 years old:  Male: 36-138 mg/dL.  Male: 41-138 mg/dL.  Children 16-53 years old:  Male: 40-163 mg/dL.  Male: 40-128 mg/dL. Triglycerides should be less than 400 mg/dL even when you are not fasting.  WHAT DO THE RESULTS MEAN?  Talk with your health care provider to discuss your results, treatment options, and if necessary, the need for more tests. Talk with your health care provider if you have any  questions about your results.   This information is not intended to replace advice given to you by your health care provider. Make sure you discuss any questions you have with your health care provider.   Document Released: 04/25/2004 Document Revised: 04/22/2014 Document Reviewed: 07/22/2013 Elsevier Interactive Patient Education [employer]

## 2015-03-17 DIAGNOSIS — E559 Vitamin D deficiency, unspecified: Secondary | ICD-10-CM | POA: Insufficient documentation

## 2015-03-17 LAB — VITAMIN D 25 HYDROXY (VIT D DEFICIENCY, FRACTURES): VIT D 25 HYDROXY: 18 ng/mL — AB (ref 30–100)

## 2015-03-17 LAB — HIV ANTIBODY (ROUTINE TESTING W REFLEX): HIV 1&2 Ab, 4th Generation: NONREACTIVE

## 2015-03-17 LAB — HEPATITIS C ANTIBODY: HCV Ab: NEGATIVE

## 2015-03-17 MED ORDER — VITAMIN D (ERGOCALCIFEROL) 1.25 MG (50000 UNIT) PO CAPS: 50000.0000 [IU] | ORAL_CAPSULE | ORAL | Status: DC

## 2015-03-17 NOTE — Progress Notes (Signed)
Quick Note:  Low show evidence of being off the thyroid medicine for a while and Vit D deficiency.  Start Vit D I called in. Continue restarted medicines. Follow up like we discussed. ______

## 2015-03-17 NOTE — Addendum Note (Signed)
Addended by: Rodolph BongOREY, Pressley Tadesse S on: 03/17/2015 07:48 AM   Modules accepted: Orders

## 2015-04-04 DIAGNOSIS — IMO0002 Reserved for concepts with insufficient information to code with codable children: Secondary | ICD-10-CM | POA: Insufficient documentation

## 2015-04-16 DIAGNOSIS — I4891 Unspecified atrial fibrillation: Secondary | ICD-10-CM

## 2015-04-16 HISTORY — DX: Unspecified atrial fibrillation: I48.91

## 2015-04-18 ENCOUNTER — Ambulatory Visit (INDEPENDENT_AMBULATORY_CARE_PROVIDER_SITE_OTHER): Payer: Federal, State, Local not specified - PPO | Admitting: Family Medicine

## 2015-04-18 ENCOUNTER — Encounter: Payer: Self-pay | Admitting: Family Medicine

## 2015-04-18 VITALS — BP 124/85 | HR 76 | Wt 236.0 lb

## 2015-04-18 DIAGNOSIS — Z0000 Encounter for general adult medical examination without abnormal findings: Secondary | ICD-10-CM

## 2015-04-18 DIAGNOSIS — E559 Vitamin D deficiency, unspecified: Secondary | ICD-10-CM

## 2015-04-18 DIAGNOSIS — E785 Hyperlipidemia, unspecified: Secondary | ICD-10-CM | POA: Diagnosis not present

## 2015-04-18 DIAGNOSIS — IMO0001 Reserved for inherently not codable concepts without codable children: Secondary | ICD-10-CM

## 2015-04-18 DIAGNOSIS — E039 Hypothyroidism, unspecified: Secondary | ICD-10-CM | POA: Diagnosis not present

## 2015-04-18 LAB — COMPREHENSIVE METABOLIC PANEL
ALK PHOS: 65 U/L (ref 40–115)
ALT: 22 U/L (ref 9–46)
AST: 23 U/L (ref 10–35)
Albumin: 4.2 g/dL (ref 3.6–5.1)
BILIRUBIN TOTAL: 0.5 mg/dL (ref 0.2–1.2)
BUN: 15 mg/dL (ref 7–25)
CO2: 27 mmol/L (ref 20–31)
Calcium: 9 mg/dL (ref 8.6–10.3)
Chloride: 106 mmol/L (ref 98–110)
Creat: 0.98 mg/dL (ref 0.70–1.33)
Glucose, Bld: 90 mg/dL (ref 65–99)
Potassium: 4.1 mmol/L (ref 3.5–5.3)
Sodium: 142 mmol/L (ref 135–146)
TOTAL PROTEIN: 6.5 g/dL (ref 6.1–8.1)

## 2015-04-18 LAB — LIPID PANEL
CHOLESTEROL: 111 mg/dL — AB (ref 125–200)
HDL: 26 mg/dL — ABNORMAL LOW (ref >=40)
LDL Cholesterol: 60 mg/dL (ref <130)
Total CHOL/HDL Ratio: 4.3 Ratio (ref <=5.0)
Triglycerides: 127 mg/dL (ref <150)
VLDL: 25 mg/dL (ref <30)

## 2015-04-18 LAB — TSH: TSH: 0.547 u[IU]/mL (ref 0.350–4.500)

## 2015-04-18 MED ORDER — LEVOTHYROXINE SODIUM 100 MCG PO TABS: 100.0000 ug | ORAL_TABLET | Freq: Every day | ORAL | Status: DC

## 2015-04-18 NOTE — Assessment & Plan Note (Signed)
Refill levothyroxine. Will check TSH and titrate dose. Return to care in 6-12 months.

## 2015-04-18 NOTE — Assessment & Plan Note (Signed)
Chronic. Will check lipid panel and CMP given lipitor for last month.

## 2015-04-18 NOTE — Assessment & Plan Note (Signed)
Supplement weekly for 1 month, will recheck levels today.

## 2015-04-18 NOTE — Patient Instructions (Signed)
Thank you for coming in today. Return in 6-12 Call or go to the emergency room if you get worse, have trouble breathing, have chest pains, or palpitations.

## 2015-04-18 NOTE — Progress Notes (Signed)
       William GurneyWillard Morton is a 60 y.o. male who [facility] today for follow up.  Patient has no active complaints today. Has been taken all medications including Vit D supplement as indicated. Fasting since midnight. Needs refill on levothyroxine.  Health maintenance: Flu shot last visit, Tdap 4 years ago, CRCa screening at 6552. Cataract surgery planned for R eye next month with desire to use multifocal lenses.   Past Medical History  Diagnosis Date  . Hyperlipidemia   . Thyroid disease    Past Surgical History  Procedure Laterality Date  . Tonsillectomy     Social History  Substance Use Topics  . Smoking status: Never Smoker   . Smokeless tobacco: Never Used  . Alcohol Use: Yes   family history includes Cancer in his brother; Heart disease in his mother.  ROS as above Medications: Current Outpatient Prescriptions  Medication Sig Dispense Refill  . atorvastatin (LIPITOR) 20 MG tablet Take 1 tablet (20 mg total) by mouth daily. 30 tablet 1  . cetirizine (ZYRTEC) 10 MG tablet Take 10 mg by mouth daily.    . fluticasone (FLONASE) 50 MCG/ACT nasal spray Place into both nostrils daily.    Marland Kitchen. levothyroxine (SYNTHROID, LEVOTHROID) 100 MCG tablet Take 1 tablet (100 mcg total) by mouth daily before breakfast. 30 tablet 3  . Vitamin D, Ergocalciferol, (DRISDOL) 50000 UNITS CAPS capsule Take 1 capsule (50,000 Units total) by mouth every 7 (seven) days. Take for 8 total doses(weeks) 8 capsule 0   No current facility-administered medications for this visit.   Allergies  Allergen Reactions  . Penicillins      Exam:  BP 124/85 mmHg  Pulse 76  Wt 236 lb (107.049 kg) Gen: Well appearing gentleman in NAD HEENT: EOMI,  MMM Lungs: Normal work of breathing. CTABL no wheezes or crackles  Heart: RRR no MRG Abd: NABS, Soft. Nondistended, Nontender Exts: Brisk capillary refill, warm  and well perfused.   No results found for this or any previous visit (from the past 24 hour(s)). No results found.   Please see individual assessment and plan sections.

## 2015-04-19 LAB — VITAMIN D 25 HYDROXY (VIT D DEFICIENCY, FRACTURES): VIT D 25 HYDROXY: 28 ng/mL — AB (ref 30–100)

## 2015-04-19 NOTE — Progress Notes (Signed)
Quick Note:  Labs look great.  Cholesterol is better.  Vitamin D deficiency improved. Take 2000 units of vitamin D daily over-the-counter.   ______

## 2015-05-04 ENCOUNTER — Observation Stay (HOSPITAL_COMMUNITY)
Admission: EM | Admit: 2015-05-04 | Discharge: 2015-05-05 | Disposition: A | Discharge status: Home / Self Care | Payer: Federal, State, Local not specified - PPO | Attending: Internal Medicine | Admitting: Internal Medicine

## 2015-05-04 ENCOUNTER — Emergency Department (HOSPITAL_COMMUNITY): Payer: Federal, State, Local not specified - PPO

## 2015-05-04 ENCOUNTER — Encounter (HOSPITAL_COMMUNITY): Payer: Self-pay

## 2015-05-04 DIAGNOSIS — E079 Disorder of thyroid, unspecified: Secondary | ICD-10-CM | POA: Insufficient documentation

## 2015-05-04 DIAGNOSIS — Z880 Allergy status to penicillin: Secondary | ICD-10-CM | POA: Diagnosis not present

## 2015-05-04 DIAGNOSIS — R110 Nausea: Secondary | ICD-10-CM | POA: Insufficient documentation

## 2015-05-04 DIAGNOSIS — R5383 Other fatigue: Secondary | ICD-10-CM | POA: Insufficient documentation

## 2015-05-04 DIAGNOSIS — E785 Hyperlipidemia, unspecified: Secondary | ICD-10-CM | POA: Diagnosis not present

## 2015-05-04 DIAGNOSIS — Z79899 Other long term (current) drug therapy: Secondary | ICD-10-CM | POA: Diagnosis not present

## 2015-05-04 DIAGNOSIS — I4891 Unspecified atrial fibrillation: Principal | ICD-10-CM | POA: Diagnosis present

## 2015-05-04 DIAGNOSIS — R42 Dizziness and giddiness: Secondary | ICD-10-CM | POA: Insufficient documentation

## 2015-05-04 DIAGNOSIS — I499 Cardiac arrhythmia, unspecified: Secondary | ICD-10-CM | POA: Diagnosis not present

## 2015-05-04 HISTORY — DX: Obstructive sleep apnea (adult) (pediatric): G47.33

## 2015-05-04 HISTORY — DX: Vitamin D deficiency, unspecified: E55.9

## 2015-05-04 LAB — COMPREHENSIVE METABOLIC PANEL
ALBUMIN: 3.9 g/dL (ref 3.5–5.0)
ALT: 23 U/L (ref 17–63)
ANION GAP: 8 (ref 5–15)
AST: 26 U/L (ref 15–41)
Alkaline Phosphatase: 61 U/L (ref 38–126)
BILIRUBIN TOTAL: 1.5 mg/dL — AB (ref 0.3–1.2)
BUN: 11 mg/dL (ref 6–20)
CHLORIDE: 106 mmol/L (ref 101–111)
CO2: 25 mmol/L (ref 22–32)
Calcium: 9.2 mg/dL (ref 8.9–10.3)
Creatinine, Ser: 1 mg/dL (ref 0.61–1.24)
GFR calc Af Amer: >60 mL/min (ref >60)
GLUCOSE: 121 mg/dL — AB (ref 65–99)
POTASSIUM: 4.2 mmol/L (ref 3.5–5.1)
Sodium: 139 mmol/L (ref 135–145)
TOTAL PROTEIN: 7 g/dL (ref 6.5–8.1)

## 2015-05-04 LAB — CBC
HCT: 45.3 % (ref 39.0–52.0)
Hemoglobin: 15.9 g/dL (ref 13.0–17.0)
MCH: 31.7 pg (ref 26.0–34.0)
MCHC: 35.1 g/dL (ref 30.0–36.0)
MCV: 90.2 fL (ref 78.0–100.0)
PLATELETS: 203 10*3/uL (ref 150–400)
RBC: 5.02 MIL/uL (ref 4.22–5.81)
RDW: 12.4 % (ref 11.5–15.5)
WBC: 6.5 10*3/uL (ref 4.0–10.5)

## 2015-05-04 LAB — I-STAT TROPONIN, ED: TROPONIN I, POC: 0 ng/mL (ref 0.00–0.08)

## 2015-05-04 LAB — BRAIN NATRIURETIC PEPTIDE: B NATRIURETIC PEPTIDE 5: 43.7 pg/mL (ref 0.0–100.0)

## 2015-05-04 LAB — MAGNESIUM: MAGNESIUM: 1.8 mg/dL (ref 1.7–2.4)

## 2015-05-04 LAB — TSH: TSH: 0.706 u[IU]/mL (ref 0.350–4.500)

## 2015-05-04 MED ORDER — ONDANSETRON HCL 4 MG/2ML IJ SOLN: 4.0000 mg | Freq: Four times a day (QID) | INTRAMUSCULAR | Status: DC | PRN

## 2015-05-04 MED ORDER — DILTIAZEM HCL 100 MG IV SOLR
5.0000 mg/h | INTRAVENOUS | Status: DC
  Administered 2015-05-04: 5 mg/h via INTRAVENOUS
  Filled 2015-05-04: qty 100

## 2015-05-04 MED ORDER — ATORVASTATIN CALCIUM 20 MG PO TABS
20.0000 mg | ORAL_TABLET | Freq: Every day | ORAL | Status: DC
  Administered 2015-05-04: 20 mg via ORAL
  Filled 2015-05-04: qty 1

## 2015-05-04 MED ORDER — IBUPROFEN 200 MG PO TABS
400.0000 mg | ORAL_TABLET | Freq: Once | ORAL | Status: AC
  Administered 2015-05-04: 400 mg via ORAL
  Filled 2015-05-04: qty 2

## 2015-05-04 MED ORDER — LORATADINE 10 MG PO TABS
10.0000 mg | ORAL_TABLET | Freq: Every day | ORAL | Status: DC | PRN
  Administered 2015-05-04: 10 mg via ORAL
  Filled 2015-05-04: qty 1

## 2015-05-04 MED ORDER — LEVOTHYROXINE SODIUM 100 MCG PO TABS
100.0000 ug | ORAL_TABLET | Freq: Every day | ORAL | Status: DC
  Administered 2015-05-05: 100 ug via ORAL
  Filled 2015-05-04: qty 1

## 2015-05-04 MED ORDER — APIXABAN 5 MG PO TABS
5.0000 mg | ORAL_TABLET | Freq: Two times a day (BID) | ORAL | Status: DC
  Administered 2015-05-04 – 2015-05-05 (×2): 5 mg via ORAL
  Filled 2015-05-04 (×2): qty 1

## 2015-05-04 MED ORDER — VITAMIN D (ERGOCALCIFEROL) 1.25 MG (50000 UNIT) PO CAPS
50000.0000 [IU] | ORAL_CAPSULE | ORAL | Status: DC
Start: 2015-05-10 — End: 2015-05-05

## 2015-05-04 MED ORDER — LORATADINE 10 MG PO TABS
10.0000 mg | ORAL_TABLET | Freq: Every day | ORAL | Status: DC
  Administered 2015-05-05: 10 mg via ORAL
  Filled 2015-05-04: qty 1

## 2015-05-04 MED ORDER — ACETAMINOPHEN 325 MG PO TABS
650.0000 mg | ORAL_TABLET | ORAL | Status: DC | PRN
  Administered 2015-05-04: 650 mg via ORAL
  Filled 2015-05-04: qty 2

## 2015-05-04 MED ORDER — FLUTICASONE PROPIONATE 50 MCG/ACT NA SUSP
1.0000 | Freq: Every day | NASAL | Status: DC | PRN
  Filled 2015-05-04: qty 16

## 2015-05-04 MED ORDER — DILTIAZEM HCL 30 MG PO TABS
30.0000 mg | ORAL_TABLET | Freq: Two times a day (BID) | ORAL | Status: DC
  Administered 2015-05-04 – 2015-05-05 (×2): 30 mg via ORAL
  Filled 2015-05-04 (×2): qty 1

## 2015-05-04 NOTE — ED Provider Notes (Signed)
Medical screening examination/treatment/procedure(s) were conducted as a shared visit with non-physician practitioner(s) and myself.  I personally evaluated the patient during the encounter.   EKG Interpretation   Date/Time:  Thursday May 04 2015 13:04:57 EST Ventricular Rate:  101 PR Interval:    QRS Duration: 101 QT Interval:  364 QTC Calculation: 472 R Axis:   66 Text Interpretation:  Atrial fibrillation Borderline low voltage,  extremity leads no prior for comparison Confirmed by Lithzy Bernard MD, Kmari Halter [phone])  on 05/04/2015 1:21:24 PM     CRITICAL CARE Performed by: Lavera Guise   Total critical care time: 30 minutes  Critical care time was exclusive of separately billable procedures and treating other patients.  Critical care was necessary to treat or prevent imminent or life-threatening deterioration.  Critical care was time spent personally by me on the following activities: development of treatment plan with patient and/or surrogate as well as nursing, discussions with consultants, evaluation of patient's response to treatment, examination of patient, obtaining history from patient or surrogate, ordering and performing treatments and interventions, ordering and review of laboratory studies, ordering and review of radiographic studies, pulse oximetry and re-evaluation of patient's condition.   60 year old male with history of hyperlipidemia and hypothyroidism who presents with fatigue, lightheadedness, palpitations. Has been feeling unwell and fatigued over the past 3 days, and this morning woke up feeling unwell. On the way to work in his car became nauseated and lightheaded, and had a near syncopal episode. He contacted EMS in the parking lot, and on their arrival he was noted to be in A. fib with RVR at 160. Has received 30 mg of diltiazem, and was noted to be rate controlled. He denies chest pain, fever, N/V/D, edema, orthopnea, or PND. On arrival, is well-appearing and in no acute  distress. His initial fibrillation at a heart rate of 90-110. Cardiopulmonary exam is unremarkable. No major electrolyte or metabolic derangements on blood work. Negative BNP and troponin with unremarkable chest x-ray. His ordered diltiazem drip as needed to maintain heart rate between 90 and 110. Suspect greater than 48 hours of symptoms; thus, not cardioversion candidate. Is admitted to cardiology for ongoing management.  Lavera Guise, MD 05/04/15 [phone]

## 2015-05-04 NOTE — Progress Notes (Signed)
Pt arrived from ED, applied bedside heart monitor and had already converted to NSR (HR=60's).  BP stable.  Cards Fellow notified.  On Card gtt at 7.5mg /hr.  EKG obtained.  Will continue to monitor and give oral cardizem & d/c drip when orders are entered.

## 2015-05-04 NOTE — ED Provider Notes (Signed)
CSN: [id number]     Arrival date & time 05/04/15  1256 History   First MD Initiated Contact with Patient 05/04/15 1305     Chief Complaint  Patient presents with  . Atrial Fibrillation     (Consider location/radiation/quality/duration/timing/severity/associated sxs/prior Treatment) The history is provided by the patient and medical records. No language interpreter was used.     William Morton is a 60 y.o. male  with a hx of hyperlipidemia and hypothyroidism presents to the Emergency Department complaining of gradual, persistent, acutely worsening fatigue with associated lightheadedness.  Patient reports 3 days of general fatigue and waking approximately 10 AM feeling poorly however is vague about this. He reports he drank some water this time and then attempted to drive to work. On the way to work about 1145 patient acutely became nauseated and lightheaded with associated shortness of breath. He reports completing his drive to work at which time EMS was contacted. Per EMS patient was found to have nausea, tremors and a heart rate of 160 in uncontrolled A. Fib. Patient was given cardiac exam 30 mg during transport.  He reports he feels significantly better at this time. He remains in A. Fib with a heart rate of 90-100. He reports resolution of nausea and dizziness. Patient denies previous cardiac history. He reports his mother has a history of MI later in life but no other known cardiac problems.  He has never seen a cardiologist.  Patient denies smoking, drinking or street drugs including cocaine.   Past Medical History  Diagnosis Date  . Hyperlipidemia   . Thyroid disease    Past Surgical History  Procedure Laterality Date  . Tonsillectomy     Family History  Problem Relation Age of Onset  . Heart disease Mother   . Cancer Brother     throat CA   Social History  Substance Use Topics  . Smoking status: Never Smoker   . Smokeless tobacco: Never Used  . Alcohol Use: Yes     Review of Systems  Constitutional: Positive for fatigue. Negative for fever, diaphoresis, appetite change and unexpected weight change.  HENT: Negative for mouth sores.   Eyes: Negative for visual disturbance.  Respiratory: Negative for cough, chest tightness, shortness of breath and wheezing.   Cardiovascular: Negative for chest pain.  Gastrointestinal: Positive for nausea (resolved). Negative for vomiting, abdominal pain, diarrhea and constipation.  Endocrine: Negative for polydipsia, polyphagia and polyuria.  Genitourinary: Negative for dysuria, urgency, frequency and hematuria.  Musculoskeletal: Negative for back pain and neck stiffness.  Skin: Negative for rash.  Allergic/Immunologic: Negative for immunocompromised state.  Neurological: Positive for weakness (improved) and light-headedness ( resolved). Negative for syncope and headaches.  Hematological: Does not bruise/bleed easily.  Psychiatric/Behavioral: Negative for sleep disturbance. The patient is not nervous/anxious.       Allergies  Penicillins  Home Medications   Prior to Admission medications   Medication Sig Start Date End Date Taking? Authorizing Provider  atorvastatin (LIPITOR) 20 MG tablet Take 1 tablet (20 mg total) by mouth daily. 03/16/15  Yes Rodolph Bong, MD  cetirizine (ZYRTEC) 10 MG tablet Take 10 mg by mouth daily.   Yes Historical Provider, MD  fluticasone (FLONASE) 50 MCG/ACT nasal spray Place 1 spray into both nostrils daily as needed for allergies.    Yes Historical Provider, MD  ibuprofen (ADVIL,MOTRIN) 200 MG tablet Take 400 mg by mouth every 6 (six) hours as needed for headache.   Yes Historical Provider, MD  levothyroxine (SYNTHROID, LEVOTHROID)  100 MCG tablet Take 1 tablet (100 mcg total) by mouth daily before breakfast. 04/18/15  Yes Rodolph Bong, MD  Vitamin D, Ergocalciferol, (DRISDOL) 50000 UNITS CAPS capsule Take 1 capsule (50,000 Units total) by mouth every 7 (seven) days. Take for 8 total  doses(weeks) 03/17/15  Yes Rodolph Bong, MD   BP 107/80 mmHg  Pulse 92  Temp(Src) 97.6 F (36.4 C) (Oral)  Resp 16  Ht 6' (1.829 m)  Wt 107.049 kg  BMI 32.00 kg/m2  SpO2 99% Physical Exam  Constitutional: He appears well-developed and well-nourished. No distress.  Awake, alert, nontoxic appearance  HENT:  Head: Normocephalic and atraumatic.  Mouth/Throat: Oropharynx is clear and moist. No oropharyngeal exudate.  Eyes: Conjunctivae are normal. No scleral icterus.  Neck: Normal range of motion. Neck supple.  Cardiovascular: Normal rate, S1 normal, S2 normal, normal heart sounds and intact distal pulses.  An irregularly irregular rhythm present.  Pulses:      Radial pulses are 2+ on the right side, and 2+ on the left side.       Dorsalis pedis pulses are 2+ on the right side, and 2+ on the left side.  Pulmonary/Chest: Effort normal and breath sounds normal. No respiratory distress. He has no wheezes.  Equal chest expansion  Abdominal: Soft. Bowel sounds are normal. He exhibits no mass. There is no tenderness. There is no rebound and no guarding.  Musculoskeletal: Normal range of motion. He exhibits no edema.  Neurological: He is alert.  Speech is clear and goal oriented Moves extremities without ataxia  Skin: Skin is warm and dry. He is not diaphoretic.  Psychiatric: He has a normal mood and affect.  Nursing note and vitals reviewed.   ED Course  Procedures (including critical care time) Labs Review Labs Reviewed  COMPREHENSIVE METABOLIC PANEL - Abnormal; Notable for the following:    Glucose, Bld 121 (*)    Total Bilirubin 1.5 (*)    All other components within normal limits  MAGNESIUM  BRAIN NATRIURETIC PEPTIDE  CBC  TSH  I-STAT TROPOININ, ED    Imaging Review Dg Chest 2 View  05/04/2015  CLINICAL DATA:  Lightheadedness. Trembling of fatigue. Nausea starting this morning. History of atrial fibrillation. EXAM: CHEST  2 VIEW COMPARISON:  None. FINDINGS: The heart size  and mediastinal contours are within normal limits. Both lungs are clear. The visualized skeletal structures are unremarkable. IMPRESSION: No active cardiopulmonary disease. Electronically Signed   By: Gaylyn Rong M.D.   On: 05/04/2015 14:06   I have personally reviewed and evaluated these images and lab results as part of my medical decision-making.   EKG Interpretation   Date/Time:  Thursday May 04 2015 13:04:57 EST Ventricular Rate:  101 PR Interval:    QRS Duration: 101 QT Interval:  364 QTC Calculation: 472 R Axis:   66 Text Interpretation:  Atrial fibrillation Borderline low voltage,  extremity leads no prior for comparison Confirmed by LIU MD, DANA ([id number])  on 05/04/2015 1:21:24 PM      MDM   Final diagnoses:  Atrial fibrillation, unspecified type (HCC)  Other fatigue   William Morton presents with new onset uncontrolled A. Fib. Unable to point patient's exact onset of symptoms as he awoke feeling poorly.  Labs pending. [facility] consult cardiology and plan for admission. At this time and do not believe that patient is a candidate for ED cardioversion.  CHADS-VASc score - 0.  No anticoagulation at this time.  The patient was discussed with and  seen by Dr. Verdie Mosher who agrees with the treatment plan.  2:29 PM Discussed with Trish of cardiology who will evaluate for admission. Planned cardiology admission.  PCP is with [facility] - Dr. Denyse Amass.    Labs reassuring. TSH pending.  No Electrolyte abnormalities.  BP 129/94 mmHg  Pulse 100  Temp(Src) 97.6 F (36.4 C) (Oral)  Resp 16  Ht 6' (1.829 m)  Wt 107.049 kg  BMI 32.00 kg/m2  SpO2 98%   Dierdre Forth, PA-C 05/04/15 1521  Lavera Guise, MD 05/04/15 [phone]

## 2015-05-04 NOTE — ED Notes (Signed)
Per EMS, Pt was driving to work when he had a sudden onset of dizziness, weakness, and SOB. Pt denies pain. Pt reports nausea and tremors when EMS arrived. Pt has Hx of Hypothyroidism. Pt was found to be in uncontrolled Afib in the 160s. Pt was given 30 mg of Cardizem during transport. Pt arrives to the ED with HR of 100 Afib and reports nausea and dizziness has subsided. Vitals per EMS: CBG 96, 100% on RA, 128/82, 160-80s Afib.

## 2015-05-04 NOTE — ED Notes (Signed)
MD at bedside. Cardiology 

## 2015-05-04 NOTE — H&P (Signed)
Patient ID: William Morton MRN: [id number], DOB/AGE: [birth year]   Admit date: 05/04/2015   Primary Physician:Evan Zollie Pee, MD Primary Cardiologist: New  Pt. Profile:  William Morton is a 60 y.o. male with a history of hypothyroidism, hyperlipidemia, vitamin D deficiency and OSA - not using CPAP who brought  to [facility] ED 05/04/15 by EMS for evaluation of new onset A. fib with RVR.  HPI: As above. [place] [employer] [place] No prior history of cardiac disease. Patient has been feeling fatigued and weak for the past 3 days. While driving to work this morning around 11 AM he felt lightheaded and diaphoretic. He drove himselt to works safely, where he continued to feel weak, dizzy  with severe sweating and mild shortness of breath where EMS was called who found him in A. fib with RVR. At rate of 160 bpm. No loss of consciousness. Given IV Cardizem 30 mg with improvement of symptoms. The patient denies nausea, vomiting, fever, chest pain, palpitations, orthopnea, PND, dizziness, syncope, cough, congestion, abdominal pain, hematochezia, melena, lower extremity edema. Mother had MI in her 31s. Father has A. fib in his 85s. No history of tobacco abuse or illicit drug use.  In ED, EKG shows A. fib at rate of 101 bpm with low voltage. No prior EKG to compare. Chest x-ray without acute cardiopulmonary disease. Point-of-care troponin negative. BNP 43. LDL 60. Lytes normal. TSH normal.  His rate in the range of 90s to 140s. Just started on IV Cardizem, no bolus given. BP  stable.  Problem List  Past Medical History  Diagnosis Date  . Hyperlipidemia   . Thyroid disease   . Vitamin D deficiency   . OSA (obstructive sleep apnea)     Past Surgical History  Procedure Laterality Date  . Tonsillectomy       Allergies  Allergies  Allergen Reactions  . Penicillins Swelling and Rash    Swelling in joints     Home Medications  Prior to Admission medications   Medication Sig Start  Date End Date Taking? Authorizing Provider  atorvastatin (LIPITOR) 20 MG tablet Take 1 tablet (20 mg total) by mouth daily. 03/16/15  Yes Rodolph Bong, MD  cetirizine (ZYRTEC) 10 MG tablet Take 10 mg by mouth daily.   Yes Historical Provider, MD  fluticasone (FLONASE) 50 MCG/ACT nasal spray Place 1 spray into both nostrils daily as needed for allergies.    Yes Historical Provider, MD  ibuprofen (ADVIL,MOTRIN) 200 MG tablet Take 400 mg by mouth every 6 (six) hours as needed for headache.   Yes Historical Provider, MD  levothyroxine (SYNTHROID, LEVOTHROID) 100 MCG tablet Take 1 tablet (100 mcg total) by mouth daily before breakfast. 04/18/15  Yes Rodolph Bong, MD  Vitamin D, Ergocalciferol, (DRISDOL) 50000 UNITS CAPS capsule Take 1 capsule (50,000 Units total) by mouth every 7 (seven) days. Take for 8 total doses(weeks) 03/17/15  Yes Rodolph Bong, MD    Family History  Family History  Problem Relation Age of Onset  . Heart disease Mother   . Cancer Brother     throat CA    Social History  Social History   Social History  . Marital Status: Unknown    Spouse Name: N/A  . Number of Children: N/A  . Years of Education: N/A   Occupational History  . Not on file.   Social History Main Topics  . Smoking status: Never Smoker   . Smokeless tobacco: Never Used  .  Alcohol Use: Yes  . Drug Use: No  . Sexual Activity: Not on file   Other Topics Concern  . Not on file   Social History Narrative    Family History  Problem Relation Age of Onset  . Heart disease Mother   . Cancer Brother     throat CA  Mother had MI in her 94s. Father has A. fib in his 54s.  All other systems reviewed and are otherwise negative except as noted above.  Physical Exam  Blood pressure 129/94, pulse 100, temperature 97.6 F (36.4 C), temperature source Oral, resp. rate 16, height 6' (1.829 m), weight 236 lb (107.049 kg), SpO2 98 %.  General: Pleasant, male in NAD Psych: Normal affect. Neuro: Alert and  oriented X 3. Moves all extremities spontaneously. HEENT: Normal  Neck: Supple without bruits or JVD. Lungs:  Resp regular and unlabored, CTA. Heart:Ir IR with tachycardia. no s3, s4, or murmurs. Abdomen: Soft, non-tender, non-distended, BS + x 4.  Extremities: No clubbing, cyanosis or edema. DP/PT/Radials 2+ and equal bilaterally.  Labs  No results for input(s): CKTOTAL, CKMB, TROPONINI in the last 72 hours. Lab Results  Component Value Date   WBC 6.5 05/04/2015   HGB 15.9 05/04/2015   HCT 45.3 05/04/2015   MCV 90.2 05/04/2015   PLT 203 05/04/2015     Recent Labs Lab 05/04/15 1353  NA 139  K 4.2  CL 106  CO2 25  BUN 11  CREATININE 1.00  CALCIUM 9.2  PROT 7.0  BILITOT 1.5*  ALKPHOS 61  ALT 23  AST 26  GLUCOSE 121*   Lab Results  Component Value Date   CHOL 111* 04/18/2015   HDL 26* 04/18/2015   LDLCALC 60 04/18/2015   TRIG 127 04/18/2015   No results found for: DDIMER   Radiology/Studies  Dg Chest 2 View  05/04/2015  CLINICAL DATA:  Lightheadedness. Trembling of fatigue. Nausea starting this morning. History of atrial fibrillation. EXAM: CHEST  2 VIEW COMPARISON:  None. FINDINGS: The heart size and mediastinal contours are within normal limits. Both lungs are clear. The visualized skeletal structures are unremarkable. IMPRESSION: No active cardiopulmonary disease. Electronically Signed   By: Gaylyn Rong M.D.   On: 05/04/2015 14:06    ECG  Vent. rate 101 BPM PR interval * ms QRS duration 101 ms QT/QTc 364/472 ms P-R-T axes -1 66 29  ASSESSMENT AND PLAN  1. New onset A. fib with RVR -Unknown duration. Unknown etiology. He has been feeling fatigue for the past 3 days. He felt dizzy and diaphoretic this morning around 11 AM while driving. - Given IV Cardizem 30 mg by EMS. Started on IV infusion at rate of 20 mL per hour, no bolus given. Blood pressure stable.  - This patients CHA2DS2-VASc Score and unadjusted Ischemic Stroke Rate (% per year) is  equal to 0.2 % stroke rate/year from a score of 0. Above score calculated as 1 point each if present [CHF, HTN, DM, Vascular=MI/PAD/Aortic Plaque, Age if 65-74, or Male] Above score calculated as 2 points each if present [Age > 75, or Stroke/TIA/TE]  - Will get echocardiogram. Hopefully he will convert by  himself overnight. Start Eliquis 5 mg twice a day. We will discuss cardioversion options  M.D, inpatient vs outpatient.   2. HL - 04/18/2015: Cholesterol 111*; HDL 26*; LDL Cholesterol 60; Triglycerides 127; VLDL 25  - Continue home dose of statin  3. Hypothyroidism - TSH normal. Continue home dose of Synthroid.  4. OSA  -  Not using CPAP.   5. Vitamin D deficiency - Continue home supplement  Signed, Balinda Heacock, PA-C 05/04/2015, 3:28 PM Pager [phone]  `

## 2015-05-04 NOTE — ED Notes (Signed)
Cardiology at the bedside.

## 2015-05-04 NOTE — ED Notes (Signed)
PA Callen Zuba at the bedside   

## 2015-05-04 NOTE — ED Notes (Signed)
Returned from  X-ray

## 2015-05-05 ENCOUNTER — Other Ambulatory Visit: Payer: Self-pay | Admitting: Physician Assistant

## 2015-05-05 ENCOUNTER — Observation Stay (HOSPITAL_COMMUNITY): Payer: Federal, State, Local not specified - PPO

## 2015-05-05 DIAGNOSIS — I4891 Unspecified atrial fibrillation: Secondary | ICD-10-CM | POA: Diagnosis not present

## 2015-05-05 DIAGNOSIS — G4733 Obstructive sleep apnea (adult) (pediatric): Secondary | ICD-10-CM

## 2015-05-05 LAB — BASIC METABOLIC PANEL
ANION GAP: 11 (ref 5–15)
BUN: 14 mg/dL (ref 6–20)
CALCIUM: 9 mg/dL (ref 8.9–10.3)
CHLORIDE: 105 mmol/L (ref 101–111)
CO2: 25 mmol/L (ref 22–32)
Creatinine, Ser: 1.09 mg/dL (ref 0.61–1.24)
GFR calc non Af Amer: >60 mL/min (ref >60)
Glucose, Bld: 82 mg/dL (ref 65–99)
Potassium: 3.9 mmol/L (ref 3.5–5.1)
Sodium: 141 mmol/L (ref 135–145)

## 2015-05-05 MED ORDER — METOPROLOL SUCCINATE ER 25 MG PO TB24: 12.5000 mg | ORAL_TABLET | Freq: Every day | ORAL | Status: DC

## 2015-05-05 NOTE — Progress Notes (Signed)
DAILY PROGRESS NOTE  Subjective:  Patient noted to spontaneously convert to NSR overnight. Feels great today and wants to go home.  Objective:  Temp:  [97.6 F (36.4 C)-97.8 F (36.6 C)] 97.8 F (36.6 C) (01/20 0448) Pulse Rate:  [57-109] 62 (01/20 0448) Resp:  [10-22] 16 (01/20 0448) BP: (92-134)/(59-95) 113/60 mmHg (01/20 0448) SpO2:  [91 %-100 %] 97 % (01/20 0448) Weight:  [227 lb 9.6 oz (103.239 kg)-236 lb (107.049 kg)] 228 lb 6.4 oz (103.602 kg) (01/20 0448) Weight change:   Intake/Output from previous day: 01/19 0701 - 01/20 0700 In: 52 [I.V.:52] Out: 375 [Urine:375]  Intake/Output from this shift:    Medications: Current Facility-Administered Medications  Medication Dose Route Frequency Provider Last Rate Last Dose  . acetaminophen (TYLENOL) tablet 650 mg  650 mg Oral Q4H PRN Bhavinkumar Bhagat, PA   650 mg at 05/04/15 1651  . apixaban (ELIQUIS) tablet 5 mg  5 mg Oral BID Bhavinkumar Bhagat, PA   5 mg at 05/04/15 2232  . atorvastatin (LIPITOR) tablet 20 mg  20 mg Oral QHS Bhavinkumar Bhagat, PA   20 mg at 05/04/15 2232  . diltiazem (CARDIZEM) tablet 30 mg  30 mg Oral Q12H Jules Husbands, MD   30 mg at 05/04/15 2154  . fluticasone (FLONASE) 50 MCG/ACT nasal spray 1 spray  1 spray Each Nare Daily PRN Bhavinkumar Bhagat, PA      . levothyroxine (SYNTHROID, LEVOTHROID) tablet 100 mcg  100 mcg Oral QAC breakfast Bhavinkumar Bhagat, PA      . loratadine (CLARITIN) tablet 10 mg  10 mg Oral Daily Bhavinkumar Bhagat, PA      . loratadine (CLARITIN) tablet 10 mg  10 mg Oral Daily PRN Jules Husbands, MD   10 mg at 05/04/15 2154  . ondansetron (ZOFRAN) injection 4 mg  4 mg Intravenous Q6H PRN Leanor Kail, PA      . [START ON 05/10/2015] Vitamin D (Ergocalciferol) (DRISDOL) capsule 50,000 Units  50,000 Units Oral Q7 days Leanor Kail, [place]        Physical Exam: General appearance: alert and no distress Lungs: clear to auscultation bilaterally Heart: regular rate and  rhythm Extremities: extremities normal, atraumatic, no cyanosis or edema  Lab Results: Results for orders placed or performed during the hospital encounter of 05/04/15 (from the past 48 hour(s))  Magnesium     Status: None   Collection Time: 05/04/15  1:53 PM  Result Value Ref Range   Magnesium 1.8 1.7 - 2.4 mg/dL  Brain natriuretic peptide (IF shortness of breath has been documented this visit)     Status: None   Collection Time: 05/04/15  1:53 PM  Result Value Ref Range   B Natriuretic Peptide 43.7 0.0 - 100.0 pg/mL  CBC     Status: None   Collection Time: 05/04/15  1:53 PM  Result Value Ref Range   WBC 6.5 4.0 - 10.5 K/uL   RBC 5.02 4.22 - 5.81 MIL/uL   Hemoglobin 15.9 13.0 - 17.0 g/dL   HCT 45.3 39.0 - 52.0 %   MCV 90.2 78.0 - 100.0 fL   MCH 31.7 26.0 - 34.0 pg   MCHC 35.1 30.0 - 36.0 g/dL   RDW 12.4 11.5 - 15.5 %   Platelets 203 150 - 400 K/uL  Comprehensive metabolic panel     Status: Abnormal   Collection Time: 05/04/15  1:53 PM  Result Value Ref Range   Sodium 139 135 - 145 mmol/L   Potassium 4.2  3.5 - 5.1 mmol/L   Chloride 106 101 - 111 mmol/L   CO2 25 22 - 32 mmol/L   Glucose, Bld 121 (H) 65 - 99 mg/dL   BUN 11 6 - 20 mg/dL   Creatinine, Ser 1.00 0.61 - 1.24 mg/dL   Calcium 9.2 8.9 - 10.3 mg/dL   Total Protein 7.0 6.5 - 8.1 g/dL   Albumin 3.9 3.5 - 5.0 g/dL   AST 26 15 - 41 U/L   ALT 23 17 - 63 U/L   Alkaline Phosphatase 61 38 - 126 U/L   Total Bilirubin 1.5 (H) 0.3 - 1.2 mg/dL   GFR calc non Af Amer >60 >60 mL/min   GFR calc Af Amer >60 >60 mL/min    Comment: (NOTE) The eGFR has been calculated using the CKD EPI equation. This calculation has not been validated in all clinical situations. eGFR's persistently <60 mL/min signify possible Chronic Kidney Disease.    Anion gap 8 5 - 15  TSH     Status: None   Collection Time: 05/04/15  1:53 PM  Result Value Ref Range   TSH 0.706 0.350 - 4.500 uIU/mL  I-stat troponin, ED (not at [facility], [facility])     Status: None     Collection Time: 05/04/15  2:07 PM  Result Value Ref Range   Troponin i, poc 0.00 0.00 - 0.08 ng/mL   Comment 3            Comment: Due to the release kinetics of cTnI, a negative result within the first hours of the onset of symptoms does not rule out myocardial infarction with certainty. If myocardial infarction is still suspected, repeat the test at appropriate intervals.   Basic metabolic panel     Status: None   Collection Time: 05/05/15  5:17 AM  Result Value Ref Range   Sodium 141 135 - 145 mmol/L   Potassium 3.9 3.5 - 5.1 mmol/L   Chloride 105 101 - 111 mmol/L   CO2 25 22 - 32 mmol/L   Glucose, Bld 82 65 - 99 mg/dL   BUN 14 6 - 20 mg/dL   Creatinine, Ser 1.09 0.61 - 1.24 mg/dL   Calcium 9.0 8.9 - 10.3 mg/dL   GFR calc non Af Amer >60 >60 mL/min   GFR calc Af Amer >60 >60 mL/min    Comment: (NOTE) The eGFR has been calculated using the CKD EPI equation. This calculation has not been validated in all clinical situations. eGFR's persistently <60 mL/min signify possible Chronic Kidney Disease.    Anion gap 11 5 - 15    Imaging: Dg Chest 2 View  05/04/2015  CLINICAL DATA:  Lightheadedness. Trembling of fatigue. Nausea starting this morning. History of atrial fibrillation. EXAM: CHEST  2 VIEW COMPARISON:  None. FINDINGS: The heart size and mediastinal contours are within normal limits. Both lungs are clear. The visualized skeletal structures are unremarkable. IMPRESSION: No active cardiopulmonary disease. Electronically Signed   By: Van Clines M.D.   On: 05/04/2015 14:06    Assessment:  1. Active Problems: 2.   New onset a-fib ([place]) 3.   Plan:   1. New onset atrial fibrillation with CHADSVASC score of 0. Converted spontaneously to NSR overnight on diltiazem gtts. Given his "0" risk score, anticoagulation is not recommended. He would benefit from low dose b-blocker - toprol XL 12.5 mg daily. Can follow-up with me in the office. I encouraged him to use CPAP at  home and he will need a  repeat outpatient sleep study. [place] for discharge home today.  Time Spent Directly with Patient:  15 minutes  Length of Stay:   Pixie Casino, MD, [facility] Attending Cardiologist [place] 05/05/2015, 8:15 AM

## 2015-05-05 NOTE — Discharge Instructions (Signed)
Atrial Fibrillation °Atrial fibrillation is a type of heartbeat that is irregular or fast (rapid). If you have this condition, your heart keeps quivering in a weird (chaotic) way. This condition can make it so your heart cannot pump blood normally. Having this condition gives a person more risk for stroke, heart failure, and other heart problems. There are different types of atrial fibrillation. Talk with your doctor to learn about the type that you have. °HOME CARE °· Take over-the-counter and prescription medicines only as told by your doctor. °· If your doctor prescribed a blood-thinning medicine, take it exactly as told. Taking too much of it can cause bleeding. If you do not take enough of it, you will not have the protection that you need against stroke and other problems. °· Do not use any tobacco products. These include cigarettes, chewing tobacco, and e-cigarettes. If you need help quitting, ask your doctor. °· If you have apnea (obstructive sleep apnea), manage it as told by your doctor. °· Do not drink alcohol. °· Do not drink beverages that have caffeine. These include coffee, soda, and tea. °· Maintain a healthy weight. Do not use diet pills unless your doctor says they are safe for you. Diet pills may make heart problems worse. °· Follow diet instructions as told by your doctor. °· Exercise regularly as told by your doctor. °· Keep all follow-up visits as told by your doctor. This is important. °GET HELP IF: °· You notice a change in the speed, rhythm, or strength of your heartbeat. °· You are taking a blood-thinning medicine and you notice more bruising. °· You get tired more easily when you move or exercise. °GET HELP RIGHT AWAY IF: °· You have pain in your chest or your belly (abdomen). °· You have sweating or weakness. °· You feel sick to your stomach (nauseous). °· You notice blood in your throw up (vomit), poop (stool), or pee (urine). °· You are short of breath. °· You suddenly have swollen feet  and ankles. °· You feel dizzy. °· Your suddenly get weak or numb in your face, arms, or legs, especially if it happens on one side of your body. °· You have trouble talking, trouble understanding, or both. °· Your face or your eyelid droops on one side. °These symptoms may be an emergency. Do not wait to see if the symptoms will go away. Get medical help right away. Call your local emergency services (911 in the [place] Do not drive yourself to the hospital. °  °This information is not intended to replace advice given to you by your health care provider. Make sure you discuss any questions you have with your health care provider. °  °Document Released: 01/09/2008 Document Revised: 12/21/2014 Document Reviewed: 07/27/2014 °Elsevier Interactive Patient Education ©2016 [employer] ° °

## 2015-05-05 NOTE — Discharge Summary (Signed)
Discharge Summary    Patient ID: William Morton,  MRN: [id number], DOB/AGE: [birth year] 60 y.o.  Admit date: 05/04/2015 Discharge date: 05/05/2015  Primary Care Provider: No PCP Per Patient Primary Cardiologist: Dr. Rennis Golden (New)  Discharge Diagnoses    New onset a-fib [facility]) with RVR Hypothyroidism Hyperlipidemia vitamin D deficiency  OSA - not using CPAP    Allergies Allergies  Allergen Reactions  . Penicillins Swelling and Rash    Swelling in joints    Diagnostic Studies/Procedures    None   History of Present Illness     William Morton is a 60 y.o. male with a history of hypothyroidism, hyperlipidemia, vitamin D deficiency and OSA - not using CPAP who brought to [facility] ED 05/04/15 by EMS for evaluation of new onset A. fib with RVR.  [place] [employer] [place]. No prior history of cardiac disease. Patient has been feeling fatigued and weak for the past 3 days. While driving to work this morning around 11 AM he felt lightheaded and diaphoretic. He drove himselt to works safely, where he continued to feel weak, dizzy with severe sweating and mild shortness of breath where EMS was called who found him in A. fib with RVR. At rate of 160 bpm. No loss of consciousness. Given IV Cardizem 30 mg with improvement of symptoms. The patient denies nausea, vomiting, fever, chest pain, palpitations, orthopnea, PND, dizziness, syncope, cough, congestion, abdominal pain, hematochezia, melena, lower extremity edema. Mother had MI in her 63s. Father has A. fib in his 66s. No history of tobacco abuse or illicit drug use.  In ED, EKG shows A. fib at rate of 101 bpm with low voltage. No prior EKG to compare. Chest x-ray without acute cardiopulmonary disease. Point-of-care troponin negative. BNP 43. LDL 60. Lytes normal. TSH normal.  His rate in the range of 90s to 140s. Started on IV Cardizem, no bolus given in ED, BP was stable.  Hospital Course     Consultants: None  The patient  stated on IV diltiazem and Eliquis . CHADSVASCSc score of 0. Patient spontaneously convert to NSR overnight. TSH was normal. Given his "0" risk score, anticoagulation is not recommended. He would benefit from low dose b-blocker - toprol XL 12.5 mg daily.  The patient has been seen by Dr.Hilty  today and deemed ready for discharge home. All follow-up appointments have been scheduled. Discharge medications are listed below.   Will schedule outpatient echo and sleep study (has has been not using CPAP since many years) and f/u appointment.     Discharge Vitals Blood pressure 113/60, pulse 62, temperature 97.8 F (36.6 C), temperature source Oral, resp. rate 16, height 6' (1.829 m), weight 228 lb 6.4 oz (103.602 kg), SpO2 97 %.  Filed Weights   05/04/15 1310 05/05/15 0030 05/05/15 0448  Weight: 236 lb (107.049 kg) 227 lb 9.6 oz (103.239 kg) 228 lb 6.4 oz (103.602 kg)    Labs & Radiologic Studies     CBC  Recent Labs  05/04/15 1353  WBC 6.5  HGB 15.9  HCT 45.3  MCV 90.2  PLT 203   Basic Metabolic Panel  Recent Labs  05/04/15 1353 05/05/15 0517  NA 139 141  K 4.2 3.9  CL 106 105  CO2 25 25  GLUCOSE 121* 82  BUN 11 14  CREATININE 1.00 1.09  CALCIUM 9.2 9.0  MG 1.8  --    Liver Function Tests  Recent Labs  05/04/15 1353  AST 26  ALT 23  ALKPHOS 61  BILITOT 1.5*  PROT 7.0  ALBUMIN 3.9   Thyroid Function Tests  Recent Labs  05/04/15 1353  TSH 0.706    Dg Chest 2 View  05/04/2015  CLINICAL DATA:  Lightheadedness. Trembling of fatigue. Nausea starting this morning. History of atrial fibrillation. EXAM: CHEST  2 VIEW COMPARISON:  None. FINDINGS: The heart size and mediastinal contours are within normal limits. Both lungs are clear. The visualized skeletal structures are unremarkable. IMPRESSION: No active cardiopulmonary disease. Electronically Signed   By: Gaylyn Rong M.D.   On: 05/04/2015 14:06    Disposition   Pt is being discharged home today in  good condition.  Follow-up Plans & Appointments    Follow-up Information    Follow up with [facility] [address]. Go on 05/17/2015.   Specialty:  Cardiology   Why:  for echocardiogram 8:30am and office will call with sleep study   Contact information:   [address] [place] [address] [phone]      Follow up with Chrystie Nose, MD. Go on 05/25/2015.   Specialty:  Cardiology   Why:  :30 am for hospital f/u   Contact information:   [address] [place] [address] [phone]      Discharge Instructions    Diet - low sodium heart healthy    Complete by:  As directed      Increase activity slowly    Complete by:  As directed            Discharge Medications   Current Discharge Medication List    START taking these medications   Details  metoprolol succinate (TOPROL XL) 25 MG 24 hr tablet Take 0.5 tablets (12.5 mg total) by mouth daily. Qty: 30 tablet, Refills: 6      CONTINUE these medications which have NOT CHANGED   Details  atorvastatin (LIPITOR) 20 MG tablet Take 1 tablet (20 mg total) by mouth daily. Qty: 30 tablet, Refills: 1   Associated Diagnoses: Well adult; HLD (hyperlipidemia)    cetirizine (ZYRTEC) 10 MG tablet Take 10 mg by mouth daily.    fluticasone (FLONASE) 50 MCG/ACT nasal spray Place 1 spray into both nostrils daily as needed for allergies.     ibuprofen (ADVIL,MOTRIN) 200 MG tablet Take 400 mg by mouth every 6 (six) hours as needed for headache.    levothyroxine (SYNTHROID, LEVOTHROID) 100 MCG tablet Take 1 tablet (100 mcg total) by mouth daily before breakfast. Qty: 30 tablet, Refills: 3   Associated Diagnoses: Well adult; HLD (hyperlipidemia); Hypothyroidism, unspecified hypothyroidism type    Vitamin D, Ergocalciferol, (DRISDOL) 50000 UNITS CAPS capsule Take 1 capsule (50,000 Units total) by mouth every 7 (seven) days. Take for 8 total doses(weeks) Qty: 8 capsule, Refills: 0            Outstanding Labs/Studies   Echocardiogram  Duration of Discharge Encounter   Greater than 30 minutes including physician time.  Signed, Farris Blash PA-C 05/05/2015, 8:54 AM

## 2015-05-06 LAB — HEMOGLOBIN A1C
Hgb A1c MFr Bld: 5.5 % (ref 4.8–5.6)
MEAN PLASMA GLUCOSE: 111 mg/dL

## 2015-05-08 ENCOUNTER — Telehealth: Payer: Self-pay | Admitting: Internal Medicine

## 2015-05-08 NOTE — Telephone Encounter (Signed)
Pt made aware of Dr. Blanchie Dessert response.  Letter composed and left at front desk for pt to pick up.  Pt aware and no questions at this time.

## 2015-05-08 NOTE — Telephone Encounter (Signed)
Pt would like note to return to work. 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  Pt has Echo scheduled for 2/1 and F/U with Dr. Rennis Golden on 2/9.  Please advise

## 2015-05-08 NOTE — Telephone Encounter (Signed)
Pt called in requesting a note to take to his employer stating that he is able to return to work. Please f/u with him to inform him when he can come and pick up the note.  Thanks

## 2015-05-08 NOTE — Telephone Encounter (Signed)
Please provide a note to return to work effective when he wishes to return.  No restrictions.  Dr. Rennis Golden

## 2015-05-09 ENCOUNTER — Other Ambulatory Visit: Payer: Self-pay | Admitting: *Deleted

## 2015-05-09 DIAGNOSIS — G4733 Obstructive sleep apnea (adult) (pediatric): Secondary | ICD-10-CM

## 2015-05-17 ENCOUNTER — Other Ambulatory Visit: Payer: Self-pay

## 2015-05-17 ENCOUNTER — Ambulatory Visit (HOSPITAL_COMMUNITY): Payer: Federal, State, Local not specified - PPO | Attending: Physician Assistant

## 2015-05-17 DIAGNOSIS — I4891 Unspecified atrial fibrillation: Secondary | ICD-10-CM | POA: Diagnosis present

## 2015-05-17 DIAGNOSIS — E785 Hyperlipidemia, unspecified: Secondary | ICD-10-CM | POA: Insufficient documentation

## 2015-05-17 DIAGNOSIS — I517 Cardiomegaly: Secondary | ICD-10-CM | POA: Diagnosis not present

## 2015-05-18 ENCOUNTER — Telehealth: Payer: Self-pay | Admitting: Internal Medicine

## 2015-05-18 NOTE — Telephone Encounter (Signed)
New Message:  Pt is returning your call in regards to his Echo results. Please f/u

## 2015-05-19 NOTE — Telephone Encounter (Signed)
Left message to call back  

## 2015-05-25 ENCOUNTER — Encounter: Payer: Self-pay | Admitting: Internal Medicine

## 2015-05-25 ENCOUNTER — Ambulatory Visit (INDEPENDENT_AMBULATORY_CARE_PROVIDER_SITE_OTHER): Payer: Federal, State, Local not specified - PPO | Admitting: Internal Medicine

## 2015-05-25 VITALS — BP 102/74 | HR 80 | Ht 72.0 in | Wt 233.3 lb

## 2015-05-25 DIAGNOSIS — I4891 Unspecified atrial fibrillation: Secondary | ICD-10-CM | POA: Diagnosis not present

## 2015-05-25 NOTE — Progress Notes (Signed)
OFFICE NOTE  Chief Complaint:  Hospital follow-up  Primary Care Physician: No PCP Per Patient  HPI:  William Morton is a 60 yo male [employer] employee with no cardiac history. He has OSA, but has not been compliant recently with CPAP. He says he has had fatigue and non-restorative sleep for a while. He was recently admitted to [facility] for worsening fatigue and palpitations. He was noted to be in a-fib with RVR. He denies chest pain, but was lightheaded and mildly diaphoretic. He reports improvement with cardizem and spontaneously converted to sinus rhythm overnight. CHADSVASC Score is 0. Discussed options with him and will start Eliquis 5 mg BID. I recommended resuming use of his CPAP machine and that he would need another sleep study for titration. After discharge she underwent an echocardiogram which showed normal LV function and mild left atrial enlargement. He reports no recurrent symptoms of palpitations or A. fib. He has felt a little fatigued, but no shortness of breath or heart racing. He was started on low-dose Toprol. He is under a lot of stress in his care for his mother who is sick and this may be playing a role in it. As low normal today at 102/74.  PMHx:  Past Medical History  Diagnosis Date  . Hyperlipidemia   . Thyroid disease   . Vitamin D deficiency   . OSA (obstructive sleep apnea)     Past Surgical History  Procedure Laterality Date  . Tonsillectomy      FAMHx:  Family History  Problem Relation Age of Onset  . Heart disease Mother   . Cancer Brother     throat CA  . Arrhythmia Father   . Heart attack Mother     SOCHx:   reports that he has never smoked. He has never used smokeless tobacco. He reports that he drinks alcohol. He reports that he does not use illicit drugs.  ALLERGIES:  Allergies  Allergen Reactions  . Penicillins Swelling and Rash    Swelling in joints    ROS: Pertinent items noted in HPI and remainder of comprehensive ROS otherwise  negative.  HOME MEDS: Current Outpatient Prescriptions  Medication Sig Dispense Refill  . atorvastatin (LIPITOR) 20 MG tablet Take 1 tablet (20 mg total) by mouth daily. 30 tablet 1  . cetirizine (ZYRTEC) 10 MG tablet Take 10 mg by mouth daily.    . fluticasone (FLONASE) 50 MCG/ACT nasal spray Place 1 spray into both nostrils daily as needed for allergies.     Marland Kitchen ibuprofen (ADVIL,MOTRIN) 200 MG tablet Take 400 mg by mouth every 6 (six) hours as needed for headache.    . levothyroxine (SYNTHROID, LEVOTHROID) 100 MCG tablet Take 1 tablet (100 mcg total) by mouth daily before breakfast. 30 tablet 3  . metoprolol succinate (TOPROL XL) 25 MG 24 hr tablet Take 0.5 tablets (12.5 mg total) by mouth daily. 30 tablet 6  . Vitamin D, Ergocalciferol, (DRISDOL) 50000 UNITS CAPS capsule Take 1 capsule (50,000 Units total) by mouth every 7 (seven) days. Take for 8 total doses(weeks) 8 capsule 0   No current facility-administered medications for this visit.    LABS/IMAGING: No results found for this or any previous visit (from the past 48 hour(s)). No results found.  WEIGHTS: Wt Readings from Last 3 Encounters:  05/25/15 233 lb 4.8 oz (105.824 kg)  05/05/15 228 lb 6.4 oz (103.602 kg)  04/18/15 236 lb (107.049 kg)    VITALS: BP 102/74 mmHg  Pulse 80  Ht  6' (1.829 m)  Wt 233 lb 4.8 oz (105.824 kg)  BMI 31.63 kg/m2  EXAM: General appearance: alert and no distress Neck: no carotid bruit and no JVD Lungs: clear to auscultation bilaterally Heart: regular rate and rhythm, S1, S2 normal, no murmur, click, rub or gallop Abdomen: soft, non-tender; bowel sounds normal; no masses,  no organomegaly Extremities: extremities normal, atraumatic, no cyanosis or edema Pulses: 2+ and symmetric Skin: Skin color, texture, turgor normal. No rashes or lesions Neurologic: Grossly normal Psych: Pleasant  EKG: Deferred   ASSESSMENT: 1. Paroxysmal atrial fibrillation, CHADSVASC score 0  2. OSA and  CPAP  PLAN: 1.   Mr. William Morton had paroxysmal atrial fibrillation which spontaneously converted. His chads score is 0. He was initially started on Eliquis but this was discontinued. He is on low-dose Toprol and does feel little bit of fatigue. It's hard to know if this is related to the medicine. Blood pressure is low normal today. He does feel more rested that he's been using CPAP. I asked him to hold his Toprol for 2 days to see if his symptoms improve. Options may be considering low-dose diltiazem or perhaps using a pocket pill strategy going forward. He is due for repeat sleep study in March. Plan to follow-up with him in 3 months.   Chrystie Nose, MD, [facility] Attending Cardiologist [facility]  Chrystie Nose 05/25/2015, 9:43 AM

## 2015-05-25 NOTE — Patient Instructions (Signed)
Dr Rennis Golden has recommended making the following medication changes: HOLD Metoprolol for a couple of days  >>If fatigue resolves, send a mychart message to Dr Rennis Golden  Dr Rennis Golden recommends that you schedule a follow-up appointment in 3 months.

## 2015-05-29 ENCOUNTER — Encounter: Payer: Self-pay | Admitting: Internal Medicine

## 2015-06-02 NOTE — Telephone Encounter (Signed)
Patient aware of echo results.

## 2015-06-07 ENCOUNTER — Other Ambulatory Visit: Payer: Self-pay | Admitting: Family Medicine

## 2015-06-11 DIAGNOSIS — H2511 Age-related nuclear cataract, right eye: Secondary | ICD-10-CM | POA: Insufficient documentation

## 2015-06-12 DIAGNOSIS — H52221 Regular astigmatism, right eye: Secondary | ICD-10-CM | POA: Insufficient documentation

## 2015-06-13 DIAGNOSIS — Z9849 Cataract extraction status, unspecified eye: Secondary | ICD-10-CM | POA: Insufficient documentation

## 2015-07-02 ENCOUNTER — Ambulatory Visit (HOSPITAL_BASED_OUTPATIENT_CLINIC_OR_DEPARTMENT_OTHER): Payer: Federal, State, Local not specified - PPO | Attending: Physician Assistant

## 2015-07-02 VITALS — Ht 72.0 in | Wt 230.0 lb

## 2015-07-02 DIAGNOSIS — Z79899 Other long term (current) drug therapy: Secondary | ICD-10-CM | POA: Diagnosis not present

## 2015-07-02 DIAGNOSIS — G4737 Central sleep apnea in conditions classified elsewhere: Secondary | ICD-10-CM | POA: Insufficient documentation

## 2015-07-02 DIAGNOSIS — I493 Ventricular premature depolarization: Secondary | ICD-10-CM | POA: Insufficient documentation

## 2015-07-02 DIAGNOSIS — R0683 Snoring: Secondary | ICD-10-CM | POA: Diagnosis not present

## 2015-07-02 DIAGNOSIS — G4733 Obstructive sleep apnea (adult) (pediatric): Secondary | ICD-10-CM | POA: Diagnosis present

## 2015-07-09 ENCOUNTER — Telehealth: Payer: Self-pay | Admitting: Cardiology

## 2015-07-09 DIAGNOSIS — G4733 Obstructive sleep apnea (adult) (pediatric): Secondary | ICD-10-CM

## 2015-07-09 NOTE — Sleep Study (Signed)
   Patient Name: William Morton, Clever MRN: [id number] Study Date: 07/02/2015 Gender: Male D.O.B: [birth year] Age (years): [age] Referring Provider: Manson PasseyBhavinkumar Morton Interpreting Physician: William Magicraci Keyona Emrich MD, ABSM RPSGT: William Morton, William Morton  Weight (lbs): 230 BMI: 31 Height (inches): 72 Neck Size: 17.50  CLINICAL INFORMATION Sleep Study Type: NPSG Indication for sleep study: OSA   SLEEP STUDY TECHNIQUE As per the AASM Manual for the Scoring of Sleep and Associated Events v2.3 (April 2016) with a hypopnea requiring 4% desaturations. The channels recorded and monitored were frontal, central and occipital EEG, electrooculogram (EOG), submentalis EMG (chin), nasal and oral airflow, thoracic and abdominal wall motion, anterior tibialis EMG, snore microphone, electrocardiogram, and pulse oximetry.  MEDICATIONS Patient's medications include: Lipitor, Zyrtec, FLonase, Ibuprofen, Synthroid, Toprol. Medications self-administered by patient during sleep study : No sleep medicine administered.  SLEEP ARCHITECTURE The study was initiated at 11:16:54 PM and ended at 5:20:04 AM. Sleep onset time was 39.0 minutes and the sleep efficiency was reduced at 71.9%. The total sleep time was 261.0 minutes. Stage REM latency was markedly prolonged at 249.0 minutes. The patient spent 17.82% of the night in stage N1 sleep, 72.03% in stage N2 sleep, 0.57% in stage N3 and 9.58% in REM. Alpha intrusion was absent. Supine sleep was 44.46%.  RESPIRATORY PARAMETERS The overall apnea/hypopnea index (AHI) was 36.3 per hour. There were 51 total apneas, including 23 obstructive, 27 central and 1 mixed apneas. There were 107 hypopneas and 8 RERAs. The AHI during Stage REM sleep was 16.8 per hour. AHI while supine was 70.3 per hour. The mean oxygen saturation was 93.93%. The minimum SpO2 during sleep was 84.00%. Loud snoring was noted during this study. CARDIAC DATA The 2 lead EKG demonstrated sinus rhythm. The mean heart  rate was 67.19 beats per minute. Other EKG findings include: PVCs.  LEG MOVEMENT DATA The total PLMS were 0 with a resulting PLMS index of 0.00. Associated arousal with leg movement index was 0.0 .  IMPRESSIONS - Severe obstructive sleep apnea occurred during this study (AHI = 36.3/h). - Mild central sleep apnea occurred during this study (CAI = 6.2/h). - Mild oxygen desaturation was noted during this study (Min O2 = 84.00%). - The patient snored with Loud snoring volume. - EKG findings include PVCs. - Clinically significant periodic limb movements did not occur during sleep. No significant associated arousals . DIAGNOSIS - Obstructive Sleep Apnea (327.23 [G47.33 ICD-10]) - Central Sleep Apnea (327.27 [G47.37 ICD-10])  RECOMMENDATIONS - CPAP titration to determine optimal pressure required to alleviate sleep disordered breathing. BiPAP or ASV titration may be required to eliminate central sleep apnea. - Positional therapy avoiding supine position during sleep. - Avoid alcohol, sedatives and other CNS depressants that may worsen sleep apnea and disrupt normal sleep architecture. - Sleep hygiene should be reviewed to assess factors that may improve sleep quality. - Weight management and regular exercise should be initiated or continued if appropriate.    William ReichertURNER,[name], American Board of Sleep Medicine  ELECTRONICALLY SIGNED ON:  07/09/2015, 5:44 PM [facility] PH: [phone]   FX: [phone] ACCREDITED BY THE AMERICAN ACADEMY OF SLEEP MEDICINE

## 2015-07-09 NOTE — Telephone Encounter (Signed)
Please let patient know that they have significant sleep apnea and had successful CPAP titration and will be set up with CPAP unit.  Please let DME know that order is in EPIC.  Please set patient up for OV in 10 weeks 

## 2015-07-11 NOTE — Telephone Encounter (Signed)
Correction - patient has severe OSA and needs to have in lab CPAP titration vs. BiPAP.  Due to degree of OSA he may need to be on BiPAP for full treatment.

## 2015-07-11 NOTE — Telephone Encounter (Signed)
Patient states that they did not put him on a CPAP the night of his study.     However, patient does not want to go back for a Titration.  He stated that he is currently on a CPAP machine (he does not currently know the settings, but he said he can find out) - he wants to know if it is possible to just continue using the machine he has and is using. He said since he is using this machine he sleeps wonderful and  he feels great the next morning after using it and according to him the pressure seems perfect.  He wants to avoid going back to the sleep lab if at all possible.  He is aware that I am sending this message back to Dr. Mayford Knifeurner and will call him when I have an answer.

## 2015-07-19 ENCOUNTER — Other Ambulatory Visit: Payer: Self-pay | Admitting: Family Medicine

## 2015-07-24 NOTE — Telephone Encounter (Signed)
Left message for patient to call back  

## 2015-07-25 ENCOUNTER — Encounter: Payer: Self-pay | Admitting: *Deleted

## 2015-07-25 NOTE — Telephone Encounter (Signed)
Patient aware of information.  Stated verbal understanding and he says that he is okay with proceeding with a Titration.  He has requested that we schedule him on a Friday Night.   I will schedule the titration and mail him a letter with the date.

## 2015-07-25 NOTE — Addendum Note (Signed)
Addended by: Arcola JanskyOOK, BETHANY M on: 07/25/2015 04:39 PM   Modules accepted: Orders

## 2015-08-01 ENCOUNTER — Encounter: Payer: Self-pay | Admitting: Family Medicine

## 2015-08-01 ENCOUNTER — Ambulatory Visit (INDEPENDENT_AMBULATORY_CARE_PROVIDER_SITE_OTHER): Payer: Federal, State, Local not specified - PPO | Admitting: Family Medicine

## 2015-08-01 VITALS — BP 137/84 | HR 77 | Temp 98.5°F | Wt 239.0 lb

## 2015-08-01 DIAGNOSIS — N4889 Other specified disorders of penis: Secondary | ICD-10-CM

## 2015-08-01 DIAGNOSIS — F411 Generalized anxiety disorder: Secondary | ICD-10-CM

## 2015-08-01 DIAGNOSIS — N489 Disorder of penis, unspecified: Secondary | ICD-10-CM | POA: Insufficient documentation

## 2015-08-01 NOTE — Progress Notes (Signed)
       William Morton is a 60 y.o. male [name] today for anxiety stress and penis lesion.  1) penis lesion. Patient has a mildly itchy lesion on the dorsal aspect of the glans of the penis present for several months. It does not correspond any new medications soaps detergents shampoos cosmetics. It is not changing. It does not hurt and there is no penile discharge. He has not tried any treatment yet.  2) Anxiety. Patient has a sick mother he has to care for. He notes increased stress at work as well. He has worsening anxiety. He denies any SI or HI. He's not interested in medications at this time. He does note that he is feeling nervous and anxious and on edge and becoming somewhat irritable.  Past Medical History  Diagnosis Date  . Hyperlipidemia   . Thyroid disease   . Vitamin D deficiency   . OSA (obstructive sleep apnea)    Past Surgical History  Procedure Laterality Date  . Tonsillectomy     Social History  Substance Use Topics  . Smoking status: Never Smoker   . Smokeless tobacco: Never Used  . Alcohol Use: Yes   family history includes Arrhythmia in his father; Cancer in his brother; Heart attack in his mother; Heart disease in his mother.  ROS as above Medications: Current Outpatient Prescriptions  Medication Sig Dispense Refill  . atorvastatin (LIPITOR) 20 MG tablet TAKE 1 TABLET(20 MG) BY MOUTH DAILY 30 tablet 6  . cetirizine (ZYRTEC) 10 MG tablet Take 10 mg by mouth daily.    . fluticasone (FLONASE) 50 MCG/ACT nasal spray Place 1 spray into both nostrils daily as needed for allergies.     Marland Kitchen. ibuprofen (ADVIL,MOTRIN) 200 MG tablet Take 400 mg by mouth every 6 (six) hours as needed for headache.    . ketorolac (ACULAR) 0.4 % SOLN INSTILL 1 DROP INTO THE RIGHT EYE QID  0  . levothyroxine (SYNTHROID, LEVOTHROID) 100 MCG tablet Take 1 tablet (100 mcg  total) by mouth daily before breakfast. 30 tablet 3  . metoprolol succinate (TOPROL XL) 25 MG 24 hr tablet Take 0.5 tablets (12.5 mg total) by mouth daily. 30 tablet 6  . Vitamin D, Ergocalciferol, (DRISDOL) 50000 UNITS CAPS capsule Take 1 capsule (50,000 Units total) by mouth every 7 (seven) days. Take for 8 total doses(weeks) 8 capsule 0   No current facility-administered medications for this visit.   Allergies  Allergen Reactions  . Penicillins Swelling and Rash    Swelling in joints     Exam:  BP 137/84 mmHg  Pulse 77  Temp(Src) 98.5 F (36.9 C) (Oral)  Wt 239 lb (108.41 kg) Gen: Well NAD HEENT: EOMI,  MMM Lungs: Normal work of breathing. CTABL Heart: RRR no MRG Abd: NABS, Soft. Nondistended, Nontender Exts: Brisk capillary refill, warm and well perfused.  Skin: Penis is circumcised with small macular somewhat scaly erythematous lesion on the dorsal aspect of the glans of the penis. No other rashes are visible. No penile discharge. Psych alert and oriented normal speech thought process and affect.  GAD 7 is 4 PHQ9 is 4  No results found for this or any previous visit (from the past 24 hour(s)). No results found.   Please see individual assessment and plan sections.

## 2015-08-01 NOTE — Assessment & Plan Note (Signed)
The lesion is most consistent with tinea corporis. Trial of Lamisil. This could also be related to a fixed drug reaction. Plan for basic lab workup and trial of Lamisil cream. If not better would consider referral to dermatology.

## 2015-08-01 NOTE — Assessment & Plan Note (Addendum)
Doing well. Refer for counseling. Recheck soon.

## 2015-08-01 NOTE — Patient Instructions (Signed)
Thank you for coming in today. We will refer for therapy.  Apply Lamisil cream twice daily for 2-3 weeks.  If not better let us know.   [facility]  Address: [address], [place], [place] [address] Phone: [phone]

## 2015-08-02 LAB — COMPREHENSIVE METABOLIC PANEL
ALBUMIN: 4 g/dL (ref 3.6–5.1)
ALT: 21 U/L (ref 9–46)
AST: 23 U/L (ref 10–35)
Alkaline Phosphatase: 68 U/L (ref 40–115)
BILIRUBIN TOTAL: 0.8 mg/dL (ref 0.2–1.2)
BUN: 16 mg/dL (ref 7–25)
CO2: 26 mmol/L (ref 20–31)
CREATININE: 1.03 mg/dL (ref 0.70–1.33)
Calcium: 9.2 mg/dL (ref 8.6–10.3)
Chloride: 104 mmol/L (ref 98–110)
Glucose, Bld: 90 mg/dL (ref 65–99)
Potassium: 4.1 mmol/L (ref 3.5–5.3)
SODIUM: 139 mmol/L (ref 135–146)
TOTAL PROTEIN: 6.6 g/dL (ref 6.1–8.1)

## 2015-08-02 LAB — CBC
HCT: 44.5 % (ref 38.5–50.0)
HEMOGLOBIN: 15.4 g/dL (ref 13.2–17.1)
MCH: 31.6 pg (ref 27.0–33.0)
MCHC: 34.6 g/dL (ref 32.0–36.0)
MCV: 91.4 fL (ref 80.0–100.0)
MPV: 10.2 fL (ref 7.5–12.5)
Platelets: 205 10*3/uL (ref 140–400)
RBC: 4.87 MIL/uL (ref 4.20–5.80)
RDW: 13.2 % (ref 11.0–15.0)
WBC: 5.7 10*3/uL (ref 3.8–10.8)

## 2015-08-02 LAB — TSH: TSH: 1.81 m[IU]/L (ref 0.40–4.50)

## 2015-08-03 LAB — RPR

## 2015-08-03 LAB — HIV ANTIBODY (ROUTINE TESTING W REFLEX): HIV 1&2 Ab, 4th Generation: NONREACTIVE

## 2015-08-04 NOTE — Progress Notes (Signed)
Quick Note:  Labs look great ______ 

## 2015-08-22 ENCOUNTER — Encounter: Payer: Self-pay | Admitting: Internal Medicine

## 2015-08-22 ENCOUNTER — Ambulatory Visit (INDEPENDENT_AMBULATORY_CARE_PROVIDER_SITE_OTHER): Payer: Federal, State, Local not specified - PPO | Admitting: Internal Medicine

## 2015-08-22 VITALS — BP 118/82 | HR 70 | Ht 72.0 in | Wt 238.0 lb

## 2015-08-22 DIAGNOSIS — G4733 Obstructive sleep apnea (adult) (pediatric): Secondary | ICD-10-CM

## 2015-08-22 DIAGNOSIS — E785 Hyperlipidemia, unspecified: Secondary | ICD-10-CM | POA: Diagnosis not present

## 2015-08-22 DIAGNOSIS — Z9989 Dependence on other enabling machines and devices: Secondary | ICD-10-CM

## 2015-08-22 DIAGNOSIS — I480 Paroxysmal atrial fibrillation: Secondary | ICD-10-CM

## 2015-08-22 NOTE — Progress Notes (Signed)
OFFICE NOTE  Chief Complaint:  No complaints  Primary Care Physician: Clementeen Graham, MD  HPI:  William Morton is a 60 yo male [employer] employee with no cardiac history. He has OSA, but has not been compliant recently with CPAP. He says he has had fatigue and non-restorative sleep for a while. He was recently admitted to [facility] for worsening fatigue and palpitations. He was noted to be in a-fib with RVR. He denies chest pain, but was lightheaded and mildly diaphoretic. He reports improvement with cardizem and spontaneously converted to sinus rhythm overnight. CHADSVASC Score is 0. Discussed options with him and will start Eliquis 5 mg BID. I recommended resuming use of his CPAP machine and that he would need another sleep study for titration. After discharge she underwent an echocardiogram which showed normal LV function and mild left atrial enlargement. He reports no recurrent symptoms of palpitations or A. fib. He has felt a little fatigued, but no shortness of breath or heart racing. He was started on low-dose Toprol. He is under a lot of stress in his care for his mother who is sick and this may be playing a role in it. As low normal today at 102/74.  08/22/2015  Mr. Quinnell returns today for follow-up. He reports only one episode of palpitations for which he took a half tablet of Toprol with some improvement. He's not had any other episodes. He reports good energy level and is been compliant with CPAP. He has an upcoming sleep study on June 9 20 may have additional changes to his equipment. He takes Lipitor for dyslipidemia and had well-controlled lipid profile recently. He is hypothyroid on levothyroxine which is followed by his primary care provider.  PMHx:  Past Medical History  Diagnosis Date  . Hyperlipidemia   . Thyroid disease   . Vitamin D deficiency   . OSA (obstructive sleep apnea)     Past Surgical History  Procedure Laterality Date  . Tonsillectomy      FAMHx:  Family  History  Problem Relation Age of Onset  . Heart disease Mother   . Cancer Brother     throat CA  . Arrhythmia Father   . Heart attack Mother     SOCHx:   reports that he has never smoked. He has never used smokeless tobacco. He reports that he drinks alcohol. He reports that he does not use illicit drugs.  ALLERGIES:  Allergies  Allergen Reactions  . Penicillins Swelling and Rash    Swelling in joints    ROS: Pertinent items noted in HPI and remainder of comprehensive ROS otherwise negative.  HOME MEDS: Current Outpatient Prescriptions  Medication Sig Dispense Refill  . atorvastatin (LIPITOR) 20 MG tablet TAKE 1 TABLET(20 MG) BY MOUTH DAILY 30 tablet 6  . cetirizine (ZYRTEC) 10 MG tablet Take 10 mg by mouth daily.    . fluticasone (FLONASE) 50 MCG/ACT nasal spray Place 1 spray into both nostrils daily as needed for allergies.     Marland Kitchen ibuprofen (ADVIL,MOTRIN) 200 MG tablet Take 400 mg by mouth every 6 (six) hours as needed for headache.    . levothyroxine (SYNTHROID, LEVOTHROID) 100 MCG tablet Take 1 tablet (100 mcg total) by mouth daily before breakfast. 30 tablet 3  . metoprolol succinate (TOPROL-XL) 25 MG 24 hr tablet Take 12.5 mg by mouth daily as needed.    . Vitamin D, Ergocalciferol, (DRISDOL) 50000 UNITS CAPS capsule Take 1 capsule (50,000 Units total) by mouth every 7 (seven) days.  Take for 8 total doses(weeks) 8 capsule 0   No current facility-administered medications for this visit.    LABS/IMAGING: No results found for this or any previous visit (from the past 48 hour(s)). No results found.  WEIGHTS: Wt Readings from Last 3 Encounters:  08/22/15 238 lb (107.956 kg)  08/01/15 239 lb (108.41 kg)  07/02/15 230 lb (104.327 kg)    VITALS: BP 118/82 mmHg  Pulse 70  Ht 6' (1.829 m)  Wt 238 lb (107.956 kg)  BMI 32.27 kg/m2  EXAM: General appearance: alert and no distress Neck: no carotid bruit and no JVD Lungs: clear to auscultation bilaterally Heart:  regular rate and rhythm, S1, S2 normal, no murmur, click, rub or gallop Abdomen: soft, non-tender; bowel sounds normal; no masses,  no organomegaly Extremities: extremities normal, atraumatic, no cyanosis or edema Pulses: 2+ and symmetric Skin: Skin color, texture, turgor normal. No rashes or lesions Neurologic: Grossly normal Psych: Pleasant  EKG: Normal sinus rhythm at 70  ASSESSMENT: 1. Paroxysmal atrial fibrillation, CHADSVASC score 0  2. OSA and CPAP 3. Dyslipidemia  PLAN: 1.   Mr. Sharol HarnessSimmons had paroxysmal atrial fibrillation which spontaneously converted. His chads score is 0. He was initially started on Eliquis but this was discontinued. He has only had one brief occurrence of atrial fibrillation for which he took low-dose Toprol which improved his symptoms. He wishes to use a when necessary strategy with the beta blocker. He is compliant with his CPAP and has an upcoming titration study. Cholesterol is followed by his primary care provider. Plan follow-up annually or sooner.  Chrystie NoseKenneth C. Terin Dierolf, MD, [facility] CenterFACC Attending Cardiologist [facility]  Chrystie NoseKenneth C Shalon Salado 08/22/2015, 8:13 AM

## 2015-08-22 NOTE — Patient Instructions (Signed)
Your physician wants you to follow-up in: 1 year with Dr. Hilty. You will receive a reminder letter in the mail two months in advance. If you don't receive a letter, please call our office to schedule the follow-up appointment.  

## 2015-09-22 ENCOUNTER — Ambulatory Visit (HOSPITAL_BASED_OUTPATIENT_CLINIC_OR_DEPARTMENT_OTHER): Payer: Federal, State, Local not specified - PPO | Attending: Cardiology | Admitting: Cardiology

## 2015-09-22 VITALS — Ht 72.0 in | Wt 230.0 lb

## 2015-09-22 DIAGNOSIS — G4730 Sleep apnea, unspecified: Secondary | ICD-10-CM | POA: Diagnosis present

## 2015-09-22 DIAGNOSIS — G4733 Obstructive sleep apnea (adult) (pediatric): Secondary | ICD-10-CM | POA: Insufficient documentation

## 2015-09-22 DIAGNOSIS — I493 Ventricular premature depolarization: Secondary | ICD-10-CM | POA: Diagnosis not present

## 2015-09-22 HISTORY — PX: CPAP TITRATION: SLE1004

## 2015-09-25 ENCOUNTER — Encounter (HOSPITAL_BASED_OUTPATIENT_CLINIC_OR_DEPARTMENT_OTHER): Payer: Self-pay | Admitting: Cardiology

## 2015-09-25 ENCOUNTER — Telehealth: Payer: Self-pay | Admitting: Cardiology

## 2015-09-25 NOTE — Telephone Encounter (Signed)
Pt had successful PAP titration. Please setup appointment in 10 weeks. Please let [facility] know that order for PAP is in EPIC.   

## 2015-09-25 NOTE — Procedures (Signed)
   Patient Name: William GurneySimmonds, Zafir MRN: [id number] Study Date: 09/22/2015 Gender: Male D.O.B: [birth year] Age (years): [age] Referring Provider: Manson PasseyBhavinkumar Bhagat Interpreting Physician: Armanda Magicraci Turner MD, ABSM RPSGT: Cherylann Parrubili, Fred  Weight (lbs): 230 BMI: 31 Height (inches): 72 Neck Size: 17.50  CLINICAL INFORMATION The patient is referred for a CPAP titration to treat sleep apnea. Date of NPSG, Split Night or HST: 07/02/2015  SLEEP STUDY TECHNIQUE As per the AASM Manual for the Scoring of Sleep and Associated Events v2.3 (April 2016) with a hypopnea requiring 4% desaturations. The channels recorded and monitored were frontal, central and occipital EEG, electrooculogram (EOG), submentalis EMG (chin), nasal and oral airflow, thoracic and abdominal wall motion, anterior tibialis EMG, snore microphone, electrocardiogram, and pulse oximetry. Continuous positive airway pressure (CPAP) was initiated at the beginning of the study and titrated to treat sleep-disordered breathing.  MEDICATIONS Medications taken by the patient : N/A  Medications administered by patient during sleep study : No sleep medicine administered.  TECHNICIAN COMMENTS Comments added by technician: Signs of cardiac arrhythmia  Comments added by scorer: N/A  RESPIRATORY PARAMETERS Optimal PAP Pressure (cm): 10  AHI at Optimal Pressure (/hr):0.5 Overall Minimal O2 (%):88.00  Supine % at Optimal Pressure (%):100 Minimal O2 at Optimal Pressure (%):89.0    SLEEP ARCHITECTURE The study was initiated at 9:39:25 PM and ended at 4:46:40 AM. Sleep onset time was 3.0 minutes and the sleep efficiency was slightly reduced at 84.1%. The total sleep time was 359.2 minutes. The patient spent 3.62% of the night in stage N1 sleep, 51.84% in stage N2 sleep, 21.71% in stage N3 and 22.83% in REM.Stage REM latency was 110.0 minutes Wake after sleep onset was 65.0. Alpha intrusion was absent. Supine sleep was 46.41%.  CARDIAC  DATA The 2 lead EKG demonstrated sinus rhythm. The mean heart rate was 65.51 beats per minute. Other EKG findings include: Frequent PVCs including bigeminal PVCs.  LEG MOVEMENT DATA The total Periodic Limb Movements of Sleep (PLMS) were 4. The PLMS index was 0.67. A PLMS index of <15 is considered normal in adults.  IMPRESSIONS - The optimal PAP pressure was 10 cm of water. - Central sleep apnea was not noted during this titration (CAI = 0.7/h). - Mild oxygen desaturations were observed during this titration (min O2 = 88.00%). - No snoring was audible during this study. - Frequent PVCs including bigeminal PVCs were observed during this study. - Clinically significant periodic limb movements were not noted during this study. Arousals associated with PLMs were rare.  DIAGNOSIS - Obstructive Sleep Apnea (327.23 [G47.33 ICD-10]) - PVCs  RECOMMENDATIONS - Trial of CPAP therapy on 10 cm H2O with a Medium size Fisher&Paykel Full Face Mask Simplus mask and heated humidification. - Avoid alcohol, sedatives and other CNS depressants that may worsen sleep apnea and disrupt normal sleep architecture. - Sleep hygiene should be reviewed to assess factors that may improve sleep quality. - Weight management and regular exercise should be initiated or continued. - Return to Sleep Center for re-evaluation after 10 weeks of therapy   Armanda Magicraci Turner Diplomate, American Board of Sleep Medicine  ELECTRONICALLY SIGNED ON:  09/25/2015, 8:14 PM [facility] PH: [phone]   FX: [phone] ACCREDITED BY THE AMERICAN ACADEMY OF SLEEP MEDICINE

## 2015-09-28 NOTE — Telephone Encounter (Signed)
Per DPR - Left a detailed message on patient's voicemail.     Let him know that we will schedule 10 week follow-up after he is started on CPAP.Marland Kitchen. [facility].

## 2015-09-29 ENCOUNTER — Ambulatory Visit (HOSPITAL_COMMUNITY): Payer: Federal, State, Local not specified - PPO | Admitting: Licensed Clinical Social Worker

## 2015-09-30 ENCOUNTER — Encounter: Payer: Self-pay | Admitting: Emergency Medicine

## 2015-09-30 ENCOUNTER — Emergency Department
Admission: EM | Admit: 2015-09-30 | Discharge: 2015-09-30 | Disposition: A | Discharge status: Home / Self Care | Payer: Federal, State, Local not specified - PPO | Source: Home / Self Care | Attending: Family Medicine | Admitting: Family Medicine

## 2015-09-30 DIAGNOSIS — N489 Disorder of penis, unspecified: Secondary | ICD-10-CM

## 2015-09-30 DIAGNOSIS — N4889 Other specified disorders of penis: Secondary | ICD-10-CM

## 2015-09-30 MED ORDER — MUPIROCIN CALCIUM 2 % NA OINT: TOPICAL_OINTMENT | NASAL | Status: DC

## 2015-09-30 NOTE — ED Notes (Signed)
Pt c/o penile lesion he has had this since April. States he tried antifungals with no relief and now feels like it is infected. Redness, swelling and pain. Denies concern for STD.

## 2015-09-30 NOTE — ED Provider Notes (Signed)
CSN: [id number]     Arrival date & time 09/30/15  1229 History   First MD Initiated Contact with Patient 09/30/15 1306     Chief Complaint  Patient presents with  . Rash   (Consider location/radiation/quality/duration/timing/severity/associated sxs/prior Treatment) HPI  Raymond GurneyWillard Sunde is a 60 y.o. male presenting to UC with c/o persistent rash on his penis since April.  He was seen by his PCP for the same lesion in April, prescribed lamisil, which he has been using as prescribed. Symptoms seemed to have improved, however, he states since yesterday, he has noticed increased redness, swelling and pain.  He was planning to call PCP Monday but rash so uncomfortable and due to location, he was concerned for infection and did not want to wait. Denies dysuria or discharge. Denies new soaps, lotions, medications. Denies concern for STD.     Past Medical History  Diagnosis Date  . Hyperlipidemia   . Thyroid disease   . Vitamin D deficiency   . OSA (obstructive sleep apnea)    Past Surgical History  Procedure Laterality Date  . Tonsillectomy    . Cpap titration  09/22/2015   Family History  Problem Relation Age of Onset  . Heart disease Mother   . Cancer Brother     throat CA  . Arrhythmia Father   . Heart attack Mother    Social History  Substance Use Topics  . Smoking status: Never Smoker   . Smokeless tobacco: Never Used  . Alcohol Use: Yes    Review of Systems  Constitutional: Negative for fever and chills.  Gastrointestinal: Negative for nausea, vomiting and abdominal pain.  Genitourinary: Positive for genital sores and penile pain (on sore). Negative for dysuria, frequency, hematuria and flank pain.    Allergies  Penicillins  Home Medications   Prior to Admission medications   Medication Sig Start Date End Date Taking? Authorizing Provider  atorvastatin (LIPITOR) 20 MG tablet TAKE 1 TABLET(20 MG) BY MOUTH DAILY 07/19/15   Rodolph BongEvan S Corey, MD  cetirizine (ZYRTEC) 10 MG  tablet Take 10 mg by mouth daily.    Historical Provider, MD  fluticasone (FLONASE) 50 MCG/ACT nasal spray Place 1 spray into both nostrils daily as needed for allergies.     Historical Provider, MD  ibuprofen (ADVIL,MOTRIN) 200 MG tablet Take 400 mg by mouth every 6 (six) hours as needed for headache.    Historical Provider, MD  levothyroxine (SYNTHROID, LEVOTHROID) 100 MCG tablet Take 1 tablet (100 mcg total) by mouth daily before breakfast. 04/18/15   Rodolph BongEvan S Corey, MD  metoprolol succinate (TOPROL-XL) 25 MG 24 hr tablet Take 12.5 mg by mouth daily as needed.    Historical Provider, MD  mupirocin nasal ointment (BACTROBAN) 2 % Apply to sore 2-3 times daily for 5 days. 09/30/15   Junius FinnerErin O'Malley, PA-C  Vitamin D, Ergocalciferol, (DRISDOL) 50000 UNITS CAPS capsule Take 1 capsule (50,000 Units total) by mouth every 7 (seven) days. Take for 8 total doses(weeks) 03/17/15   Rodolph BongEvan S Corey, MD   Meds Ordered and Administered this Visit  Medications - No data to display  BP 126/84 mmHg  Pulse 85  Temp(Src) 97.8 F (36.6 C) (Oral)  Wt 231 lb (104.781 kg)  SpO2 97% No data found.   Physical Exam  Constitutional: He is oriented to person, place, and time. He appears well-developed and well-nourished.  HENT:  Head: Normocephalic and atraumatic.  Eyes: EOM are normal.  Neck: Normal range of motion.  Cardiovascular: Normal rate.  Pulmonary/Chest: Effort normal.  Genitourinary: Circumcised. Penile erythema and penile tenderness present. No discharge found.  1cm rash on dorsal aspect of penis near the glans. Centralized moist appearing skin lesion, tender to touch. No edema. Scant clear discharge from lesion. No vesicles or surrounding rashes.   Musculoskeletal: Normal range of motion.  Neurological: He is alert and oriented to person, place, and time.  Skin: Skin is warm and dry.  Psychiatric: He has a normal mood and affect. His behavior is normal.  Nursing note and vitals reviewed.   ED Course   Procedures (including critical care time)  Labs Review Labs Reviewed - No data to display  Imaging Review No results found.   MDM   1. Penile lesion    Pt c/o continued rash and sore on penis since April, temporary relief with Lamisil.  Due to open wound appearance of rash, will cover with antibiotic ointment but also encouraged to f/u with PCP for dermatology referral as well. Patient verbalized understanding and agreement with treatment plan.     Junius Finner, PA-C 09/30/15 1526

## 2015-09-30 NOTE — Discharge Instructions (Signed)
You may try the ointment 2-3 times daily for 5 days. If symptoms worsen, please discontinue the use of the medication and call our office.  Please schedule a follow up appointment with a dermatologist for further evaluation of rash/sore.

## 2015-10-02 ENCOUNTER — Telehealth: Payer: Self-pay | Admitting: *Deleted

## 2015-10-02 DIAGNOSIS — N489 Disorder of penis, unspecified: Secondary | ICD-10-CM

## 2015-10-02 NOTE — Telephone Encounter (Signed)
Patient called and would like to go ahead and proceed with the derm referral that was discussed on 08/01/2015. Referral placed

## 2015-10-11 ENCOUNTER — Emergency Department
Admission: EM | Admit: 2015-10-11 | Discharge: 2015-10-11 | Disposition: A | Discharge status: Home / Self Care | Payer: Federal, State, Local not specified - PPO | Source: Home / Self Care | Attending: Family Medicine | Admitting: Family Medicine

## 2015-10-11 ENCOUNTER — Encounter: Payer: Self-pay | Admitting: Emergency Medicine

## 2015-10-11 DIAGNOSIS — R319 Hematuria, unspecified: Secondary | ICD-10-CM

## 2015-10-11 DIAGNOSIS — R509 Fever, unspecified: Secondary | ICD-10-CM | POA: Diagnosis not present

## 2015-10-11 DIAGNOSIS — R5383 Other fatigue: Secondary | ICD-10-CM

## 2015-10-11 LAB — POCT URINALYSIS DIP (MANUAL ENTRY)
Bilirubin, UA: NEGATIVE
GLUCOSE UA: NEGATIVE
Ketones, POC UA: NEGATIVE
LEUKOCYTES UA: NEGATIVE
NITRITE UA: NEGATIVE
Protein Ur, POC: NEGATIVE
Spec Grav, UA: 1.025 (ref 1.005–1.03)
UROBILINOGEN UA: 0.2 (ref 0–1)
pH, UA: 5 (ref 5–8)

## 2015-10-11 LAB — POCT CBC W AUTO DIFF (K'VILLE URGENT CARE)

## 2015-10-11 NOTE — ED Provider Notes (Signed)
CSN: [id number]     Arrival date & time 10/11/15  0912 History   First MD Initiated Contact with Patient 10/11/15 1009     Chief Complaint  Patient presents with  . Fever      HPI Comments: Patient reports that he was unusually fatigued last week although he has been under increased stress taking care of his mother who has been hospitalized.  Two days ago he developed fever to 101.5 with myalgias and increased fatigue.  He normally has seasonal allergies with sinus congestion, post-nasal drainage, and occasional cough, and there has been no change in those symptoms.  No GI or GU symptoms.  He states that his assymptomatic microscopic hematuria has been observed and evaluated in the past.  The history is provided by the patient.    Past Medical History  Diagnosis Date  . Hyperlipidemia   . Thyroid disease   . Vitamin D deficiency   . OSA (obstructive sleep apnea)    Past Surgical History  Procedure Laterality Date  . Tonsillectomy    . Cpap titration  09/22/2015   Family History  Problem Relation Age of Onset  . Heart disease Mother   . Cancer Brother     throat CA  . Arrhythmia Father   . Heart attack Mother    Social History  Substance Use Topics  . Smoking status: Never Smoker   . Smokeless tobacco: Never Used  . Alcohol Use: Yes    Review of Systems No sore throat ? cough No pleuritic pain No wheezing + nasal congestion ? post-nasal drainage No sinus pain/pressure No itchy/red eyes No earache No hemoptysis No SOB + fever, + chills No nausea No vomiting No abdominal pain No diarrhea No urinary symptoms No skin rash + fatigue + myalgias No headache Used OTC meds without relief  Allergies  Penicillins  Home Medications   Prior to Admission medications   Medication Sig Start Date End Date Taking? Authorizing Provider  atorvastatin (LIPITOR) 20 MG tablet TAKE 1 TABLET(20 MG) BY MOUTH DAILY 07/19/15   Rodolph BongEvan S Corey, MD  cetirizine (ZYRTEC) 10 MG tablet  Take 10 mg by mouth daily.    Historical Provider, MD  fluticasone (FLONASE) 50 MCG/ACT nasal spray Place 1 spray into both nostrils daily as needed for allergies.     Historical Provider, MD  ibuprofen (ADVIL,MOTRIN) 200 MG tablet Take 400 mg by mouth every 6 (six) hours as needed for headache.    Historical Provider, MD  levothyroxine (SYNTHROID, LEVOTHROID) 100 MCG tablet Take 1 tablet (100 mcg total) by mouth daily before breakfast. 04/18/15   Rodolph BongEvan S Corey, MD  metoprolol succinate (TOPROL-XL) 25 MG 24 hr tablet Take 12.5 mg by mouth daily as needed.    Historical Provider, MD  mupirocin nasal ointment (BACTROBAN) 2 % Apply to sore 2-3 times daily for 5 days. 09/30/15   Junius FinnerErin O'Malley, PA-C  Vitamin D, Ergocalciferol, (DRISDOL) 50000 UNITS CAPS capsule Take 1 capsule (50,000 Units total) by mouth every 7 (seven) days. Take for 8 total doses(weeks) 03/17/15   Rodolph BongEvan S Corey, MD   Meds Ordered and Administered this Visit  Medications - No data to display  BP 124/86 mmHg  Pulse 83  Temp(Src) 99.3 F (37.4 C) (Oral)  Ht 6' (1.829 m)  Wt 236 lb (107.049 kg)  BMI 32.00 kg/m2  SpO2 97% No data found.   Physical Exam Nursing notes and Vital Signs reviewed. Appearance:  Patient appears stated age, and in no acute  distress Eyes:  Pupils are equal, round, and reactive to light and accomodation.  Extraocular movement is intact.  Conjunctivae are not inflamed  Ears:  Canals normal.  Tympanic membranes normal.  Nose:  Mildly congested turbinates.  No sinus tenderness.   Pharynx:  Normal Neck:  Supple. No adenopathy Lungs:  Clear to auscultation.  Breath sounds are equal.  Moving air well. Heart:  Regular rate and rhythm without murmurs, rubs, or gallops.  Abdomen:  Nontender without masses or hepatosplenomegaly.  Bowel sounds are present.  No CVA or flank tenderness.  Extremities:  No edema.  Skin:  No rash present.   ED Course  Procedures none    Labs Reviewed  POCT URINALYSIS DIP (MANUAL  ENTRY) - Abnormal; Notable for the following:    Blood, UA moderate (*)    All other components within normal limits  URINE CULTURE  POCT CBC W AUTO DIFF ([facility]):  WBC 11.5; LY 16.7; MO 11.4; GR 71.9; Hgb 15.9; Platelets 184       MDM   1. Blood in urine   2. Fever, unspecified ?etiology  3. Other fatigue    Urine culture pending. Treat symptomatically for now  Increase fluid intake.  Check temperature daily.  May take Ibuprofen 200mg , 4 tabs every 8 hours with food as needed. If symptoms become significantly worse during the night or over the weekend, proceed to the local emergency room.  Followup with PCP in 48 hours.    Lattie HawStephen A Beese, MD 10/11/15 1438

## 2015-10-11 NOTE — Discharge Instructions (Signed)
Increase fluid intake.  Check temperature daily.  May take Ibuprofen 200mg , 4 tabs every 8 hours with food as needed. If symptoms become significantly worse during the night or over the weekend, proceed to the local emergency room.    Fever, Adult A fever is an increase in the body's temperature. It is usually defined as a temperature of 100F (38C) or higher. Brief mild or moderate fevers generally have no long-term effects, and they often do not require treatment. Moderate or high fevers may make you feel uncomfortable and can sometimes be a sign of a serious illness or disease. The sweating that may occur with repeated or prolonged fever may also cause dehydration. Fever is confirmed by taking a temperature with a thermometer. A measured temperature can vary with:  Age.  Time of day.  Location of the thermometer:  Mouth (oral).  Rectum (rectal).  Ear (tympanic).  Underarm (axillary).  Forehead (temporal). HOME CARE INSTRUCTIONS Pay attention to any changes in your symptoms. Take these actions to help with your condition:  Take over-the counter and prescription medicines only as told by your health care provider. Follow the dosing instructions carefully.  If you were prescribed an antibiotic medicine, take it as told by your health care provider. Do not stop taking the antibiotic even if you start to feel better.  Rest as needed.  Drink enough fluid to keep your urine clear or pale yellow. This helps to prevent dehydration.  Sponge yourself or bathe with room-temperature water to help reduce your body temperature as needed. Do not use ice water.  Do not overbundle yourself in blankets or heavy clothes. SEEK MEDICAL CARE IF:  You vomit.  You cannot eat or drink without vomiting.  You have diarrhea.  You have pain when you urinate.  Your symptoms do not improve with treatment.  You develop new symptoms.  You develop excessive weakness. SEEK IMMEDIATE MEDICAL CARE  IF:  You have shortness of breath or have trouble breathing.  You are dizzy or you faint.  You are disoriented or confused.  You develop signs of dehydration, such as a dry mouth, decreased urination, or paleness.  You develop severe pain in your abdomen.  You have persistent vomiting or diarrhea.  You develop a skin rash.  Your symptoms suddenly get worse.   This information is not intended to replace advice given to you by your health care provider. Make sure you discuss any questions you have with your health care provider.   Document Released: 09/25/2000 Document Revised: 12/21/2014 Document Reviewed: 05/26/2014 Elsevier Interactive Patient Education [employer]2016 [employer]vier Inc.

## 2015-10-11 NOTE — ED Notes (Signed)
Fever 101.5 x 2 days, runny nose. States he has been in and out of hospital visiting his mother for the past week.

## 2015-10-13 ENCOUNTER — Ambulatory Visit (INDEPENDENT_AMBULATORY_CARE_PROVIDER_SITE_OTHER): Payer: Federal, State, Local not specified - PPO | Admitting: Family Medicine

## 2015-10-13 ENCOUNTER — Telehealth: Payer: Self-pay | Admitting: Emergency Medicine

## 2015-10-13 ENCOUNTER — Encounter: Payer: Self-pay | Admitting: Family Medicine

## 2015-10-13 VITALS — BP 121/85 | HR 94 | Temp 99.5°F | Wt 237.0 lb

## 2015-10-13 DIAGNOSIS — R3121 Asymptomatic microscopic hematuria: Secondary | ICD-10-CM | POA: Insufficient documentation

## 2015-10-13 DIAGNOSIS — R490 Dysphonia: Secondary | ICD-10-CM

## 2015-10-13 LAB — URINE CULTURE
Colony Count: NO GROWTH
ORGANISM ID, BACTERIA: NO GROWTH

## 2015-10-13 MED ORDER — IPRATROPIUM BROMIDE 0.06 % NA SOLN: 2.0000 | NASAL | Status: DC | PRN

## 2015-10-13 NOTE — ED Notes (Signed)
Culture is neg, hope he is better

## 2015-10-13 NOTE — Progress Notes (Signed)
       William Morton is a 60 y.o. male who [name] today for hoarse voice. Patient developed fever congestion 3 days ago. He was seen in urgent care where his previous asymptomatic hematuria was discovered again. Culture was obtained and patient was treated conservatively with symptomatic management. He notes he is feeling overall much better. He denies any current fevers or chills vomiting diarrhea shortness of breath. He notes continued postnasal drainage and hoarse voice quality starting a day ago. He denies any urinary symptoms.   Past Medical History  Diagnosis Date  . Hyperlipidemia   . Thyroid disease   . Vitamin D deficiency   . OSA (obstructive sleep apnea)    Past Surgical History  Procedure Laterality Date  . Tonsillectomy    . Cpap titration  09/22/2015   Social History  Substance Use Topics  . Smoking status: Never Smoker   . Smokeless tobacco: Never Used  . Alcohol Use: Yes   family history includes Arrhythmia in his father; Cancer in his brother; Heart attack in his mother; Heart disease in his mother.  ROS as above:  Medications: Current Outpatient Prescriptions  Medication Sig Dispense Refill  . atorvastatin (LIPITOR) 20 MG tablet TAKE 1 TABLET(20 MG) BY MOUTH DAILY 30 tablet 6  . cetirizine (ZYRTEC) 10 MG tablet Take 10 mg by mouth daily.    . fluticasone (FLONASE) 50 MCG/ACT nasal spray Place 1 spray into both nostrils daily as needed for allergies.     Marland Kitchen. ibuprofen (ADVIL,MOTRIN) 200 MG tablet Take 400 mg by mouth every 6 (six) hours as needed for headache.    . levothyroxine (SYNTHROID, LEVOTHROID) 100 MCG tablet Take 1 tablet (100 mcg total) by mouth daily before breakfast. 30 tablet 3  . metoprolol succinate (TOPROL-XL) 25 MG 24 hr tablet Take 12.5 mg by mouth daily as needed.    . mupirocin nasal ointment (BACTROBAN) 2 % Apply  to sore 2-3 times daily for 5 days. 1 g 0  . Vitamin D, Ergocalciferol, (DRISDOL) 50000 UNITS CAPS capsule Take 1 capsule (50,000 Units total) by mouth every 7 (seven) days. Take for 8 total doses(weeks) 8 capsule 0  . ipratropium (ATROVENT) 0.06 % nasal spray Place 2 sprays into both nostrils every 4 (four) hours as needed for rhinitis. 10 mL 6   No current facility-administered medications for this visit.   Allergies  Allergen Reactions  . Penicillins Swelling and Rash    Swelling in joints     Exam:  BP 121/85 mmHg  Pulse 94  Temp(Src) 99.5 F (37.5 C) (Oral)  Wt 237 lb (107.502 kg)  SpO2 96% Gen: Well NAD HEENT: EOMI,  MMM posterior pharynx with cobblestoning. Mild clear nasal discharge Lungs: Normal work of breathing. CTABL Heart: RRR no MRG Abd: NABS, Soft. Nondistended, Nontender Exts: Brisk capillary refill, warm and well perfused.   No results found for this or any previous visit (from the past 24 hour(s)). No results found.    Assessment and Plan: 60 y.o. male with  Resolving viral URI with hoarseness. Plan for symptomatic management with Atrovent nasal spray to hopefully treat postnasal drainage which is think is the main culprit here.  He is now asymptomatic hematuria has been worked up previously and found to be not reflective of any dangerous etiology. Culture remains negative so far. Return as needed.  Discussed warning signs or symptoms. Please see discharge instructions. Patient expresses understanding.

## 2015-10-13 NOTE — Patient Instructions (Signed)
Thank you for coming in today. Use the nasal spray.  Return if not better.  Call or go to the emergency room if you get worse, have trouble breathing, have chest pains, or palpitations.

## 2015-10-27 ENCOUNTER — Other Ambulatory Visit: Payer: Self-pay | Admitting: Family Medicine

## 2015-11-09 ENCOUNTER — Ambulatory Visit (INDEPENDENT_AMBULATORY_CARE_PROVIDER_SITE_OTHER): Payer: Federal, State, Local not specified - PPO | Admitting: Family Medicine

## 2015-11-09 VITALS — BP 117/80 | HR 89 | Wt 237.0 lb

## 2015-11-09 DIAGNOSIS — F411 Generalized anxiety disorder: Secondary | ICD-10-CM | POA: Diagnosis not present

## 2015-11-09 DIAGNOSIS — E039 Hypothyroidism, unspecified: Secondary | ICD-10-CM

## 2015-11-09 DIAGNOSIS — I480 Paroxysmal atrial fibrillation: Secondary | ICD-10-CM | POA: Diagnosis not present

## 2015-11-09 DIAGNOSIS — E785 Hyperlipidemia, unspecified: Secondary | ICD-10-CM

## 2015-11-09 DIAGNOSIS — G4733 Obstructive sleep apnea (adult) (pediatric): Secondary | ICD-10-CM

## 2015-11-09 DIAGNOSIS — Z9989 Dependence on other enabling machines and devices: Secondary | ICD-10-CM

## 2015-11-09 NOTE — Progress Notes (Signed)
William Morton is a 60 y.o. male who presents to [facility]: Primary Care Sports Medicine today for follow-up anxiety and hyperlipidemia, hypothyroidism, and atrial fibrillation.  Patient has done well since he was last seen. He notes his anxiety symptoms are improving as he has a little more control over his life right now. 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 He has hired some personal care services that have helped reduce his work load. Additionally he notes that he no longer has any palpitations relation. He pains or shortness of breath.  He feels well otherwise no fevers or chills nausea vomiting or diarrhea.    Past Medical History:  Diagnosis Date  . Hyperlipidemia   . OSA (obstructive sleep apnea)   . Thyroid disease   . Vitamin D deficiency    Past Surgical History:  Procedure Laterality Date  . CPAP TITRATION  09/22/2015  . TONSILLECTOMY     Social History  Substance Use Topics  . Smoking status: Never Smoker  . Smokeless tobacco: Never Used  . Alcohol use Yes   family history includes Arrhythmia in his father; Cancer in his brother; Heart attack in his mother; Heart disease in his mother.  ROS as above:  Medications: Current Outpatient Prescriptions  Medication Sig Dispense Refill  . atorvastatin (LIPITOR) 20 MG tablet TAKE 1 TABLET(20 MG) BY MOUTH DAILY 30 tablet 6  . cetirizine (ZYRTEC) 10 MG tablet Take 10 mg by mouth daily.    . Cholecalciferol (VITAMIN D PO) Take 300 Units by mouth.    . fluticasone (FLONASE) 50 MCG/ACT nasal spray Place 1 spray into both nostrils daily as needed for allergies.     Marland Kitchen ibuprofen (ADVIL,MOTRIN) 200 MG tablet Take 400 mg by mouth every 6 (six) hours as needed for headache.    . ipratropium (ATROVENT) 0.06 % nasal spray Place 2 sprays into both nostrils every 4 (four) hours as needed for rhinitis. 10 mL 6    . levothyroxine (SYNTHROID, LEVOTHROID) 100 MCG tablet TAKE 1 TABLET(100 MCG) BY MOUTH DAILY BEFORE BREAKFAST 30 tablet 0  . metoprolol succinate (TOPROL-XL) 25 MG 24 hr tablet Take 12.5 mg by mouth daily as needed.    . mupirocin nasal ointment (BACTROBAN) 2 % Apply to sore 2-3 times daily for 5 days. 1 g 0   No current facility-administered medications for this visit.    Allergies  Allergen Reactions  . Penicillins Swelling and Rash    Swelling in joints     Exam:  BP 117/80   Pulse 89   Wt 237 lb (107.5 kg)   BMI 32.14 kg/m  Gen: Well NAD HEENT: EOMI,  MMM Lungs: Normal work of breathing. CTABL Heart: RRR no MRG Abd: NABS, Soft. Nondistended, Nontender Exts: Brisk capillary refill, warm and well perfused.  Psych: Alert and oriented normal speech thought process and affect.  Lab Results  Component Value Date   TSH 1.81 08/01/2015    Lab Results  Component Value Date   CHOL 111 (L) 04/18/2015   HDL 26 (L) 04/18/2015   LDLCALC 60 04/18/2015   TRIG 127 04/18/2015   CHOLHDL 4.3 04/18/2015     Chemistry      Component Value Date/Time   NA 139 08/01/2015 0928   K 4.1 08/01/2015 0928   CL 104 08/01/2015 0928   CO2 26 08/01/2015 0928   BUN 16 08/01/2015 0928   CREATININE 1.03 08/01/2015 6060  Component Value Date/Time   CALCIUM 9.2 08/01/2015 0928   ALKPHOS 68 08/01/2015 0928   AST 23 08/01/2015 0928   ALT 21 08/01/2015 0928   BILITOT 0.8 08/01/2015 0928       No results found for this or any previous visit (from the past 24 hour(s)). No results found.    Assessment and Plan: 60 y.o. male with  Hypothyroidism: TSH normal recently. Recheck in about 6 months. Tinea current dose.  Atrial fibrillation: In sinus rhythm today per exam. Use the Toprol as needed. Continue CPap  Hyperlipidemia: Last lipid panel was excellent. Recheck in about 6 months. Continue Lipitor.  Anxiety: Doing well.  No orders of the defined types were placed in this  encounter.   Discussed warning signs or symptoms. Please see discharge instructions. Patient expresses understanding.

## 2015-11-09 NOTE — Patient Instructions (Signed)
Thank you for coming in today. Things seem to be going well.  We will keep the status quo.  Return in about 6 months for a well visit.  We will recheck labs then.

## 2015-11-14 DIAGNOSIS — L300 Nummular dermatitis: Secondary | ICD-10-CM | POA: Diagnosis not present

## 2015-12-03 ENCOUNTER — Other Ambulatory Visit: Payer: Self-pay | Admitting: Family Medicine

## 2016-03-18 ENCOUNTER — Encounter: Payer: Self-pay | Admitting: Family Medicine

## 2016-03-18 ENCOUNTER — Ambulatory Visit (INDEPENDENT_AMBULATORY_CARE_PROVIDER_SITE_OTHER): Payer: Federal, State, Local not specified - PPO | Admitting: Family Medicine

## 2016-03-18 VITALS — BP 127/80 | HR 76 | Wt 241.0 lb

## 2016-03-18 DIAGNOSIS — Z23 Encounter for immunization: Secondary | ICD-10-CM | POA: Diagnosis not present

## 2016-03-18 DIAGNOSIS — F411 Generalized anxiety disorder: Secondary | ICD-10-CM

## 2016-03-18 DIAGNOSIS — Z0000 Encounter for general adult medical examination without abnormal findings: Secondary | ICD-10-CM

## 2016-03-18 DIAGNOSIS — E785 Hyperlipidemia, unspecified: Secondary | ICD-10-CM

## 2016-03-18 DIAGNOSIS — E039 Hypothyroidism, unspecified: Secondary | ICD-10-CM

## 2016-03-18 DIAGNOSIS — E559 Vitamin D deficiency, unspecified: Secondary | ICD-10-CM

## 2016-03-18 DIAGNOSIS — I480 Paroxysmal atrial fibrillation: Secondary | ICD-10-CM | POA: Diagnosis not present

## 2016-03-18 LAB — COMPLETE METABOLIC PANEL WITH GFR
ALBUMIN: 4.3 g/dL (ref 3.6–5.1)
ALK PHOS: 73 U/L (ref 40–115)
ALT: 24 U/L (ref 9–46)
AST: 23 U/L (ref 10–35)
BUN: 15 mg/dL (ref 7–25)
CO2: 28 mmol/L (ref 20–31)
Calcium: 9.2 mg/dL (ref 8.6–10.3)
Chloride: 104 mmol/L (ref 98–110)
Creat: 1.08 mg/dL (ref 0.70–1.33)
GFR, EST AFRICAN AMERICAN: 87 mL/min (ref >=60)
GFR, EST NON AFRICAN AMERICAN: 75 mL/min (ref >=60)
GLUCOSE: 96 mg/dL (ref 65–99)
POTASSIUM: 4.2 mmol/L (ref 3.5–5.3)
SODIUM: 139 mmol/L (ref 135–146)
Total Bilirubin: 0.6 mg/dL (ref 0.2–1.2)
Total Protein: 6.9 g/dL (ref 6.1–8.1)

## 2016-03-18 LAB — LIPID PANEL
Cholesterol: 113 mg/dL (ref <200)
HDL: 31 mg/dL — AB (ref >40)
LDL CALC: 65 mg/dL (ref <100)
Total CHOL/HDL Ratio: 3.6 Ratio (ref <5.0)
Triglycerides: 84 mg/dL (ref <150)
VLDL: 17 mg/dL (ref <30)

## 2016-03-18 LAB — TSH: TSH: 2.13 mIU/L (ref 0.40–4.50)

## 2016-03-18 NOTE — Patient Instructions (Signed)
Thank you for coming in today. Return in 1 year or sooner if needed.  Get labs today.

## 2016-03-18 NOTE — Progress Notes (Signed)
William GurneyWillard Morton is a 60 y.o. male who [name]: Primary Care [facility] Medicine today for well adult visit.   Well adult.  Doing well with no acute issues. Active medical problems listed below.  Due to flu vaccine today.   1) Anxiety: Patient feels that he is doing better in the past few months. He has been less anxious since  Getting a caregiver to help take care of his elderly mother and getting FMLA coverage.   2) HLD: Tolerating Lipitor well. No muscle aches or pains.  3) Hypothyroidism: Tolerating synthroid well. No weight changes, fatigue, cold or heat intolerance.   4) Atrial fibrillation: Doing well taking PRN metoprolol per his cardiologist once every few months when he has palpitations. No syncope or CP.  He is using CPAP nightly now for OSA, which has improved his SOB.  Past Medical History:  Diagnosis Date  . Hyperlipidemia   . OSA (obstructive sleep apnea)   . Thyroid disease   . Vitamin D deficiency    Past Surgical History:  Procedure Laterality Date  . CPAP TITRATION  09/22/2015  . TONSILLECTOMY     Social History  Substance Use Topics  . Smoking status: Never Smoker  . Smokeless tobacco: Never Used  . Alcohol use Yes   family history includes Arrhythmia in his father; Cancer in his brother; Heart attack in his mother; Heart disease in his mother.  ROS as above:  Medications: Current Outpatient Prescriptions  Medication Sig Dispense Refill  . atorvastatin (LIPITOR) 20 MG tablet TAKE 1 TABLET(20 MG) BY MOUTH DAILY 30 tablet 6  . cetirizine (ZYRTEC) 10 MG tablet Take 10 mg by mouth daily.    . Cholecalciferol (VITAMIN D PO) Take 300 Units by mouth.    . fluticasone (FLONASE) 50 MCG/ACT nasal spray Place 1 spray into both nostrils daily as needed for allergies.     Marland Kitchen. ibuprofen (ADVIL,MOTRIN) 200 MG tablet Take 400 mg by mouth every 6 (six) hours as needed  for headache.    . ipratropium (ATROVENT) 0.06 % nasal spray Place 2 sprays into both nostrils every 4 (four) hours as needed for rhinitis. 10 mL 6  . levothyroxine (SYNTHROID, LEVOTHROID) 100 MCG tablet TAKE 1 TABLET(100 MCG) BY MOUTH DAILY BEFORE BREAKFAST 90 tablet 0  . metoprolol succinate (TOPROL-XL) 25 MG 24 hr tablet Take 12.5 mg by mouth daily as needed.    . mupirocin nasal ointment (BACTROBAN) 2 % Apply to sore 2-3 times daily for 5 days. 1 g 0   No current facility-administered medications for this visit.    Allergies  Allergen Reactions  . Penicillins Swelling and Rash    Swelling in joints    Health Maintenance Health Maintenance  Topic Date Due  . INFLUENZA VACCINE  11/14/2015  . TETANUS/TDAP  04/15/2018  . COLONOSCOPY  04/15/2020  . Hepatitis C Screening  Completed  . HIV Screening  Completed     Exam:  BP 127/80   Pulse 76   Wt 241 lb (109.3 kg)   BMI 32.69 kg/m  Gen: Well NAD HEENT: EOMI,  MMM Lungs: Normal work of breathing. CTABL Heart: RRR no MRG Abd: NABS, Soft. Nondistended, Nontender Exts: Brisk capillary refill, warm and well perfused.    No results found for this or any previous visit (from the past 72 hour(s)). No results found.  GAD 7 : Generalized Anxiety Score 03/18/2016  Nervous, Anxious, on Edge 1  Control/stop worrying 1  Worry too much - different things 1  Trouble relaxing 0  Restless 0  Easily annoyed or irritable 1  Afraid - awful might happen 0  Total GAD 7 Score 4  Anxiety Difficulty Somewhat difficult    Assessment and Plan: 60 y.o. male with well adult.   Doing well. Flu vaccine given.     Anxiety: Subjectively improved from last visit.   HLD: Recheck lipid panel and CMP today. Continue Lipitor.  Hypothyroidism: Recheck TSH. Continue synthroid.  Atrial fibrillation: In sinus rhythm today per exam. Continue PRN metoprolol.  Orders Placed This Encounter  Procedures  . Flu Vaccine QUAD 36+ mos IM  . Lipid  panel  . TSH  . COMPLETE METABOLIC PANEL WITH GFR  . VITAMIN D 25 Hydroxy (Vit-D Deficiency, Fractures)    Discussed warning signs or symptoms. Please see discharge instructions. Patient expresses understanding.

## 2016-03-19 LAB — VITAMIN D 25 HYDROXY (VIT D DEFICIENCY, FRACTURES): VIT D 25 HYDROXY: 36 ng/mL (ref 30–100)

## 2016-04-10 ENCOUNTER — Other Ambulatory Visit: Payer: Self-pay | Admitting: Family Medicine

## 2016-04-24 ENCOUNTER — Emergency Department (INDEPENDENT_AMBULATORY_CARE_PROVIDER_SITE_OTHER)
Admission: EM | Admit: 2016-04-24 | Discharge: 2016-04-24 | Disposition: A | Discharge status: Home / Self Care | Payer: Federal, State, Local not specified - PPO | Source: Home / Self Care | Attending: Family Medicine | Admitting: Family Medicine

## 2016-04-24 ENCOUNTER — Encounter: Payer: Self-pay | Admitting: *Deleted

## 2016-04-24 DIAGNOSIS — R69 Illness, unspecified: Secondary | ICD-10-CM

## 2016-04-24 DIAGNOSIS — J111 Influenza due to unidentified influenza virus with other respiratory manifestations: Secondary | ICD-10-CM

## 2016-04-24 HISTORY — DX: Unspecified atrial fibrillation: I48.91

## 2016-04-24 MED ORDER — OSELTAMIVIR PHOSPHATE 75 MG PO CAPS: 75.0000 mg | ORAL_CAPSULE | Freq: Two times a day (BID) | ORAL | 0 refills | Status: DC

## 2016-04-24 NOTE — ED Provider Notes (Signed)
[facility]    CSN: [id number] Arrival date & time: 04/24/16  1054     History   Chief Complaint Chief Complaint  Patient presents with  . Cough    HPI William Morton is a 61 y.o. male.   Complains of 2 day history flu-like illness including myalgias, headache, fever/chills, fatigue, and cough.  Also has mild nasal congestion and sore throat.  Cough is non-productive and somewhat worse at night.  No pleuritic pain or shortness of breath.  He had a flu shot approximately 5 weeks ago.   The history is provided by the patient.    Past Medical History:  Diagnosis Date  . Atrial fibrillation (HCC)   . Hyperlipidemia   . OSA (obstructive sleep apnea)   . Thyroid disease   . Vitamin D deficiency     Patient Active Problem List   Diagnosis Date Noted  . Asymptomatic microscopic hematuria 10/13/2015  . OSA on CPAP 08/22/2015  . Lesion of penis 08/01/2015  . Anxiety state 08/01/2015  . H/O cataract extraction 06/13/2015  . Hyperlipidemia   . Atrial fibrillation (HCC)   . Other fatigue   . Vitamin D deficiency 03/17/2015  . HLD (hyperlipidemia) 03/16/2015  . Hypothyroid 03/16/2015  . Lumbar degenerative disc disease 09/21/2014    Past Surgical History:  Procedure Laterality Date  . CPAP TITRATION  09/22/2015  . EYE SURGERY    . TONSILLECTOMY         Home Medications    Prior to Admission medications   Medication Sig Start Date End Date Taking? Authorizing Provider  atorvastatin (LIPITOR) 20 MG tablet TAKE 1 TABLET(20 MG) BY MOUTH DAILY 07/19/15  Yes Rodolph Bong, MD  levothyroxine (SYNTHROID, LEVOTHROID) 100 MCG tablet TAKE 1 TABLET(100 MCG) BY MOUTH DAILY BEFORE BREAKFAST 04/11/16  Yes Rodolph Bong, MD  cetirizine (ZYRTEC) 10 MG tablet Take 10 mg by mouth daily.    Historical Provider, MD  Cholecalciferol (VITAMIN D PO) Take 300 Units by mouth.    Historical Provider, MD  fluticasone (FLONASE) 50 MCG/ACT nasal spray Place 1 spray into both nostrils  daily as needed for allergies.     Historical Provider, MD  ibuprofen (ADVIL,MOTRIN) 200 MG tablet Take 400 mg by mouth every 6 (six) hours as needed for headache.    Historical Provider, MD  metoprolol succinate (TOPROL-XL) 25 MG 24 hr tablet Take 12.5 mg by mouth daily as needed.    Historical Provider, MD  mupirocin nasal ointment (BACTROBAN) 2 % Apply to sore 2-3 times daily for 5 days. 09/30/15   Junius Finner, PA-C  oseltamivir (TAMIFLU) 75 MG capsule Take 1 capsule (75 mg total) by mouth every 12 (twelve) hours. 04/24/16   Lattie Haw, MD    Family History Family History  Problem Relation Age of Onset  . Heart disease Mother   . Heart attack Mother   . Cancer Brother     throat CA  . Arrhythmia Father   . Cancer Father     Social History Social History  Substance Use Topics  . Smoking status: Never Smoker  . Smokeless tobacco: Never Used  . Alcohol use Yes     Allergies   Penicillins   Review of Systems Review of Systems  + sore throat + cough No pleuritic pain No wheezing + nasal congestion + post-nasal drainage No sinus pain/pressure No itchy/red eyes No earache No hemoptysis No SOB + fever, + chills No nausea No vomiting No abdominal pain  No diarrhea No urinary symptoms No skin rash + fatigue + myalgias + headache Used OTC meds without relief    Physical Exam Triage Vital Signs ED Triage Vitals  Enc Vitals Group     BP 04/24/16 1114 107/74     Pulse Rate 04/24/16 1114 80     Resp 04/24/16 1114 18     Temp 04/24/16 1114 98.5 F (36.9 C)     Temp Source 04/24/16 1114 Oral     SpO2 04/24/16 1114 96 %     Weight 04/24/16 1114 236 lb (107 kg)     Height 04/24/16 1114 6' (1.829 m)     Head Circumference --      Peak Flow --      Pain Score 04/24/16 1116 0     Pain Loc --      Pain Edu? --      Excl. in GC? --    No data found.   Updated Vital Signs BP 107/74 (BP Location: Left Arm)   Pulse 80   Temp 98.5 F (36.9 C) (Oral)    Resp 18   Ht 6' (1.829 m)   Wt 236 lb (107 kg)   SpO2 96%   BMI 32.01 kg/m   Visual Acuity Right Eye Distance:   Left Eye Distance:   Bilateral Distance:    Right Eye Near:   Left Eye Near:    Bilateral Near:     Physical Exam Nursing notes and Vital Signs reviewed. Appearance:  Patient appears stated age, and in no acute distress Eyes:  Pupils are equal, round, and reactive to light and accomodation.  Extraocular movement is intact.  Conjunctivae are not inflamed  Ears:  Canals normal.  Tympanic membranes normal.  Nose:  Mildly congested turbinates.  No sinus tenderness.    Pharynx:  Normal Neck:  Supple.  Tender enlarged posterior/lateral nodes are palpated bilaterally  Lungs:  Clear to auscultation.  Breath sounds are equal.  Moving air well. Heart:  Regular rate and rhythm without murmurs, rubs, or gallops.  Abdomen:  Nontender without masses or hepatosplenomegaly.  Bowel sounds are present.  No CVA or flank tenderness.  Extremities:  No edema.  Skin:  No rash present.    UC Treatments / Results  Labs (all labs ordered are listed, but only abnormal results are displayed) Labs Reviewed - No data to display  EKG  EKG Interpretation None       Radiology No results found.  Procedures Procedures (including critical care time)  Medications Ordered in UC Medications - No data to display   Initial Impression / Assessment and Plan / UC Course  I have reviewed the triage vital signs and the nursing notes.  Pertinent labs & imaging results that were available during my care of the patient were reviewed by me and considered in my medical decision making (see chart for details).  Clinical Course   Begin Tamiflu. Take plain guaifenesin (1200mg  extended release tabs such as Mucinex) twice daily, with plenty of water, for cough and congestion.  May add Pseudoephedrine (30mg , one or two every 4 to 6 hours) for sinus congestion.  Get adequate rest.   May use Afrin nasal  spray (or generic oxymetazoline) twice daily for about 5 days and then discontinue.  Also recommend using saline nasal spray several times daily and saline nasal irrigation (AYR is a common brand).  Use Flonase nasal spray each morning after using Afrin nasal spray and saline nasal irrigation. Try  warm salt water gargles for sore throat.  Stop all antihistamines for now, and other non-prescription cough/cold preparations. May take Ibuprofen 200mg , 4 tabs every 8 hours with food for body aches, headache, fever, etc. May take Delsym Cough Suppressant at bedtime for nighttime cough.  Followup with Family Doctor if not improved in about 5 days.     Final Clinical Impressions(s) / UC Diagnoses   Final diagnoses:  Influenza-like illness    New Prescriptions New Prescriptions   OSELTAMIVIR (TAMIFLU) 75 MG CAPSULE    Take 1 capsule (75 mg total) by mouth every 12 (twelve) hours.     Lattie HawStephen A Marleta Lapierre, MD 05/08/16 [phone]

## 2016-04-24 NOTE — ED Triage Notes (Signed)
Pt c/o fever 101, body aches, nonproductive cough, and HA x 2 days. Flu vaccine December 1.

## 2016-04-24 NOTE — Discharge Instructions (Signed)
Take plain guaifenesin (1200mg extended release tabs such as Mucinex) twice daily, with plenty of water, for cough and congestion.  May add Pseudoephedrine (30mg, one or two every 4 to 6 hours) for sinus congestion.  Get adequate rest.   °May use Afrin nasal spray (or generic oxymetazoline) twice daily for about 5 days and then discontinue.  Also recommend using saline nasal spray several times daily and saline nasal irrigation (AYR is a common brand).  Use Flonase nasal spray each morning after using Afrin nasal spray and saline nasal irrigation. °Try warm salt water gargles for sore throat.  °Stop all antihistamines for now, and other non-prescription cough/cold preparations. °May take Ibuprofen 200mg, 4 tabs every 8 hours with food for body aches, headache, fever, etc. °May take Delsym Cough Suppressant at bedtime for nighttime cough.  °

## 2016-04-29 ENCOUNTER — Ambulatory Visit (INDEPENDENT_AMBULATORY_CARE_PROVIDER_SITE_OTHER): Payer: Federal, State, Local not specified - PPO

## 2016-04-29 ENCOUNTER — Ambulatory Visit (INDEPENDENT_AMBULATORY_CARE_PROVIDER_SITE_OTHER): Payer: Federal, State, Local not specified - PPO | Admitting: Family Medicine

## 2016-04-29 VITALS — BP 132/86 | HR 96 | Wt 235.0 lb

## 2016-04-29 DIAGNOSIS — R05 Cough: Secondary | ICD-10-CM | POA: Diagnosis not present

## 2016-04-29 DIAGNOSIS — R509 Fever, unspecified: Secondary | ICD-10-CM

## 2016-04-29 DIAGNOSIS — R059 Cough, unspecified: Secondary | ICD-10-CM

## 2016-04-29 DIAGNOSIS — J189 Pneumonia, unspecified organism: Secondary | ICD-10-CM | POA: Diagnosis not present

## 2016-04-29 MED ORDER — BENZONATATE 200 MG PO CAPS: 200.0000 mg | ORAL_CAPSULE | Freq: Three times a day (TID) | ORAL | 0 refills | Status: DC | PRN

## 2016-04-29 MED ORDER — LEVOFLOXACIN 500 MG PO TABS: 500.0000 mg | ORAL_TABLET | Freq: Every day | ORAL | 0 refills | Status: DC

## 2016-04-29 NOTE — Progress Notes (Signed)
William GurneyWillard Morton is a 61 y.o. male who [name] Primary [facility] Medicine today for cough congestion fevers chills body aches headache. Patient has been sick for several days. He was seen in urgent care on January 10 were he was diagnosed with influenza presumptively. He was treated empirically with Tamiflu. He notes he slightly better but still is quite ill. He denies shortness of breath or vomiting or diarrhea. He's been taking Tamiflu ibuprofen and Mucinex which helped a little. The cough is bothersome and interferes with sleep. He is worried he may have developed pneumonia.    Past Medical History:  Diagnosis Date  . Atrial fibrillation (HCC)   . Hyperlipidemia   . OSA (obstructive sleep apnea)   . Thyroid disease   . Vitamin D deficiency    Past Surgical History:  Procedure Laterality Date  . CPAP TITRATION  09/22/2015  . EYE SURGERY    . TONSILLECTOMY     Social History  Substance Use Topics  . Smoking status: Never Smoker  . Smokeless tobacco: Never Used  . Alcohol use Yes   family history includes Arrhythmia in his father; Cancer in his brother and father; Heart attack in his mother; Heart disease in his mother.  ROS as above:  Medications: Current Outpatient Prescriptions  Medication Sig Dispense Refill  . atorvastatin (LIPITOR) 20 MG tablet TAKE 1 TABLET(20 MG) BY MOUTH DAILY 30 tablet 6  . benzonatate (TESSALON) 200 MG capsule Take 1 capsule (200 mg total) by mouth 3 (three) times daily as needed for cough. 45 capsule 0  . cetirizine (ZYRTEC) 10 MG tablet Take 10 mg by mouth daily.    . Cholecalciferol (VITAMIN D PO) Take 300 Units by mouth.    . fluticasone (FLONASE) 50 MCG/ACT nasal spray Place 1 spray into both nostrils daily as needed for allergies.     Marland Kitchen. ibuprofen (ADVIL,MOTRIN) 200 MG tablet Take 400 mg by mouth every 6 (six) hours as needed for headache.     . levofloxacin (LEVAQUIN) 500 MG tablet Take 1 tablet (500 mg total) by mouth daily. 7 tablet 0  . levothyroxine (SYNTHROID, LEVOTHROID) 100 MCG tablet TAKE 1 TABLET(100 MCG) BY MOUTH DAILY BEFORE BREAKFAST 90 tablet 1  . metoprolol succinate (TOPROL-XL) 25 MG 24 hr tablet Take 12.5 mg by mouth daily as needed.    . mupirocin nasal ointment (BACTROBAN) 2 % Apply to sore 2-3 times daily for 5 days. 1 g 0  . oseltamivir (TAMIFLU) 75 MG capsule Take 1 capsule (75 mg total) by mouth every 12 (twelve) hours. 10 capsule 0   No current facility-administered medications for this visit.    Allergies  Allergen Reactions  . Penicillins Swelling and Rash    Swelling in joints    Health Maintenance Health Maintenance  Topic Date Due  . TETANUS/TDAP  04/15/2018  . COLONOSCOPY  04/15/2020  . INFLUENZA VACCINE  Completed  . Hepatitis C Screening  Completed  . HIV Screening  Completed     Exam:  BP 132/86   Pulse 96   Wt 235 lb (106.6 kg)   BMI 31.87 kg/m  Gen: Well NAD HEENT: EOMI,  MMM Clear nasal discharge. Posterior pharynx cobblestoning.  Lungs: Normal work of breathing.Breath sounds throughout worse in the right lower lung field  Heart: RRR no MRG Abd: NABS, Soft. Nondistended, Nontender Exts: Brisk capillary refill, warm and well perfused.   Chest x-ray: Concerning for infiltrate seen  on  lateral chest x-ray. Awaiting formal radiology review   No results found for this or any previous visit (from the past 72 hour(s)). No results found.    Assessment and Plan: 61 y.o. male with influenza-like illness with possible secondary bacterial pneumonia. Treat with Levaquin as well as Tessalon Perles. Continue over-the-counter medications. Return as needed.  Orders Placed This Encounter  Procedures  . DG Chest 2 View    Order Specific Question:   Reason for exam:    Answer:   Cough, assess intra-thoracic pathology    Order Specific Question:   Preferred imaging location?     Answer:   [facility]    Discussed warning signs or symptoms. Please see discharge instructions. Patient expresses understanding.

## 2016-04-29 NOTE — Patient Instructions (Signed)
Thank you for coming in today. Call or go to the emergency room if you get worse, have trouble breathing, have chest pains, or palpitations.    Community-Acquired Pneumonia, Adult Introduction Pneumonia is an infection of the lungs. One type of pneumonia can happen while a person is in a hospital. A different type can happen when a person is not in a hospital (community-acquired pneumonia). It is easy for this kind to spread from person to person. It can spread to you if you breathe near an infected person who coughs or sneezes. Some symptoms include:  A dry cough.  A wet (productive) cough.  Fever.  Sweating.  Chest pain. Follow these instructions at home:  Take over-the-counter and prescription medicines only as told by your doctor.  Only take cough medicine if you are losing sleep.  If you were prescribed an antibiotic medicine, take it as told by your doctor. Do not stop taking the antibiotic even if you start to feel better.  Sleep with your head and neck raised (elevated). You can do this by putting a few pillows under your head, or you can sleep in a recliner.  Do not use tobacco products. These include cigarettes, chewing tobacco, and e-cigarettes. If you need help quitting, ask your doctor.  Drink enough water to keep your pee (urine) clear or pale yellow. A shot (vaccine) can help prevent pneumonia. Shots are often suggested for:  People older than 59 years of age.  People older than 59 years of age:  Who are having cancer treatment.  Who have long-term (chronic) lung disease.  Who have problems with their body's defense system (immune system). You may also prevent pneumonia if you take these actions:  Get the flu (influenza) shot every year.  Go to the dentist as often as told.  Wash your hands often. If soap and water are not available, use hand sanitizer. Contact a doctor if:  You have a fever.  You lose sleep because your cough medicine does not  help. Get help right away if:  You are short of breath and it gets worse.  You have more chest pain.  Your sickness gets worse. This is very serious if:  You are an older adult.  Your body's defense system is weak.  You cough up blood. This information is not intended to replace advice given to you by your health care provider. Make sure you discuss any questions you have with your health care provider. Document Released: 09/18/2007 Document Revised: 09/07/2015 Document Reviewed: 07/27/2014  2017 Elsevier

## 2016-06-18 ENCOUNTER — Telehealth: Payer: Self-pay | Admitting: Family Medicine

## 2016-06-18 NOTE — Telephone Encounter (Signed)
Please give him a call about discussion you guys had earlier about [facility], that he was needing a referral for

## 2016-06-19 NOTE — Telephone Encounter (Signed)
I had a long discussion about Mr William Morton' mother. She is elderly and fell and broke her hip and is in a skilled nursing facility now. He thinks she lacks capacity to make medical decisions and is interested in referral to an attorney who has knowledge in this case. I referred him tom Sherlyn LeesKathryn Jackilyn Umphlett who practices as area of wall. Additionally we had a long discussion about prognosis and how he is doing. We'll keep in touch.

## 2016-07-16 ENCOUNTER — Ambulatory Visit (INDEPENDENT_AMBULATORY_CARE_PROVIDER_SITE_OTHER): Payer: Federal, State, Local not specified - PPO | Admitting: Internal Medicine

## 2016-07-16 ENCOUNTER — Encounter: Payer: Self-pay | Admitting: Internal Medicine

## 2016-07-16 VITALS — BP 126/79 | HR 62 | Ht 72.0 in | Wt 236.0 lb

## 2016-07-16 DIAGNOSIS — E785 Hyperlipidemia, unspecified: Secondary | ICD-10-CM | POA: Diagnosis not present

## 2016-07-16 DIAGNOSIS — Z9989 Dependence on other enabling machines and devices: Secondary | ICD-10-CM

## 2016-07-16 DIAGNOSIS — G4733 Obstructive sleep apnea (adult) (pediatric): Secondary | ICD-10-CM | POA: Diagnosis not present

## 2016-07-16 DIAGNOSIS — I4891 Unspecified atrial fibrillation: Secondary | ICD-10-CM | POA: Diagnosis not present

## 2016-07-16 DIAGNOSIS — E039 Hypothyroidism, unspecified: Secondary | ICD-10-CM

## 2016-07-16 MED ORDER — METOPROLOL SUCCINATE ER 25 MG PO TB24: 12.5000 mg | ORAL_TABLET | Freq: Every day | ORAL | 1 refills | Status: DC | PRN

## 2016-07-16 NOTE — Patient Instructions (Signed)
Your physician recommends that you schedule a follow-up appointment in: 1 year with Dr. Hilty 

## 2016-07-16 NOTE — Progress Notes (Signed)
OFFICE NOTE  Chief Complaint:  No complaints  Primary Care Physician: Clementeen Graham, MD  HPI:  William Morton is a 61 yo male [employer] employee with no cardiac history. He has OSA, but has not been compliant recently with CPAP. He says he has had fatigue and non-restorative sleep for a while. He was recently admitted to [facility] for worsening fatigue and palpitations. He was noted to be in a-fib with RVR. He denies chest pain, but was lightheaded and mildly diaphoretic. He reports improvement with cardizem and spontaneously converted to sinus rhythm overnight. CHADSVASC Score is 0. Discussed options with him and will start Eliquis 5 mg BID. I recommended resuming use of his CPAP machine and that he would need another sleep study for titration. After discharge she underwent an echocardiogram which showed normal LV function and mild left atrial enlargement. He reports no recurrent symptoms of palpitations or A. fib. He has felt a little fatigued, but no shortness of breath or heart racing. He was started on low-dose Toprol. He is under a lot of stress in his care for his mother who is sick and this may be playing a role in it. As low normal today at 102/74.  08/22/2015  Mr. Markovic returns today for follow-up. He reports only one episode of palpitations for which he took a half tablet of Toprol with some improvement. He's not had any other episodes. He reports good energy level and is been compliant with CPAP. He has an upcoming sleep study on June 9 20 may have additional changes to his equipment. He takes Lipitor for dyslipidemia and had well-controlled lipid profile recently. He is hypothyroid on levothyroxine which is followed by his primary care provider.  07/16/2016  Mr. Mccubbins returns for follow-up. Over the last year he's had very few episodes of A. fib. He had some episode where he felt like his heart rate was slow with hard heart beats which could indicate PVCs. He reports pretty good compliance  with CPAP. He is concerned about whether his thyroid is well-regulated however in December 2017, his TSH was normal 2.13. Cholesterol that time was excellent as well with total cholesterol 113, triglycerides 84, HDL-C 31 and LDL-C 65 on atorvastatin 20.   PMHx:  Past Medical History:  Diagnosis Date  . Atrial fibrillation (HCC)   . Hyperlipidemia   . OSA (obstructive sleep apnea)   . Thyroid disease   . Vitamin D deficiency     Past Surgical History:  Procedure Laterality Date  . CPAP TITRATION  09/22/2015  . EYE SURGERY    . TONSILLECTOMY      FAMHx:  Family History  Problem Relation Age of Onset  . Heart disease Mother   . Heart attack Mother   . Cancer Brother     throat CA  . Arrhythmia Father   . Cancer Father     SOCHx:   reports that he has never smoked. He has never used smokeless tobacco. He reports that he drinks alcohol. He reports that he does not use drugs.  ALLERGIES:  Allergies  Allergen Reactions  . Penicillins Swelling and Rash    Swelling in joints    ROS: Pertinent items noted in HPI and remainder of comprehensive ROS otherwise negative.  HOME MEDS: Current Outpatient Prescriptions  Medication Sig Dispense Refill  . atorvastatin (LIPITOR) 20 MG tablet TAKE 1 TABLET(20 MG) BY MOUTH DAILY 30 tablet 6  . benzonatate (TESSALON) 200 MG capsule Take 1 capsule (200 mg total)  by mouth 3 (three) times daily as needed for cough. 45 capsule 0  . cetirizine (ZYRTEC) 10 MG tablet Take 10 mg by mouth daily.    . Cholecalciferol (VITAMIN D PO) Take 300 Units by mouth.    . fluticasone (FLONASE) 50 MCG/ACT nasal spray Place 1 spray into both nostrils daily as needed for allergies.     Marland Kitchen ibuprofen (ADVIL,MOTRIN) 200 MG tablet Take 400 mg by mouth every 6 (six) hours as needed for headache.    . levofloxacin (LEVAQUIN) 500 MG tablet Take 1 tablet (500 mg total) by mouth daily. 7 tablet 0  . levothyroxine (SYNTHROID, LEVOTHROID) 100 MCG tablet TAKE 1 TABLET(100  MCG) BY MOUTH DAILY BEFORE BREAKFAST 90 tablet 1  . metoprolol succinate (TOPROL-XL) 25 MG 24 hr tablet Take 12.5 mg by mouth daily as needed.    . mupirocin nasal ointment (BACTROBAN) 2 % Apply to sore 2-3 times daily for 5 days. 1 g 0  . oseltamivir (TAMIFLU) 75 MG capsule Take 1 capsule (75 mg total) by mouth every 12 (twelve) hours. 10 capsule 0   No current facility-administered medications for this visit.     LABS/IMAGING: No results found for this or any previous visit (from the past 48 hour(s)). No results found.  WEIGHTS: Wt Readings from Last 3 Encounters:  04/29/16 235 lb (106.6 kg)  04/24/16 236 lb (107 kg)  03/18/16 241 lb (109.3 kg)    VITALS: There were no vitals taken for this visit.  EXAM: General appearance: alert and no distress Neck: no carotid bruit and no JVD Lungs: clear to auscultation bilaterally Heart: regular rate and rhythm, S1, S2 normal, no murmur, click, rub or gallop Abdomen: soft, non-tender; bowel sounds normal; no masses,  no organomegaly Extremities: extremities normal, atraumatic, no cyanosis or edema Pulses: 2+ and symmetric Skin: Skin color, texture, turgor normal. No rashes or lesions Neurologic: Grossly normal Psych: Pleasant  EKG: Normal sinus rhythm at 62  ASSESSMENT: 1. Paroxysmal atrial fibrillation, CHADSVASC score 0  2. OSA and CPAP 3. Dyslipidemia 4. Hypothyroidism  PLAN: 1.   Mr. [name] is not any significant recurrent A. fib that I can tell. He did have some lower heart rate with hard heart beats which could've been nonconducted PVCs. He did not take metoprolol for that and I told him if he were to notice his heart rate was slow that perhaps he should not take metoprolol. Is compliant for the most part with CPAP. Cholesterol is well controlled and thyroid is within normal range. I've encouraged exercise and some work on weight loss over the next year. We'll plan to see him back annually or sooner as necessary.  Chrystie Nose, MD, [facility] Attending Cardiologist [facility]  Chrystie Nose 07/16/2016, 9:13 AM

## 2016-08-20 ENCOUNTER — Other Ambulatory Visit: Payer: Self-pay | Admitting: Family Medicine

## 2016-10-07 ENCOUNTER — Ambulatory Visit (INDEPENDENT_AMBULATORY_CARE_PROVIDER_SITE_OTHER): Payer: Federal, State, Local not specified - PPO | Admitting: Family Medicine

## 2016-10-07 ENCOUNTER — Encounter: Payer: Self-pay | Admitting: Family Medicine

## 2016-10-07 VITALS — BP 120/85 | HR 77 | Temp 98.3°F | Ht 72.0 in | Wt 231.0 lb

## 2016-10-07 DIAGNOSIS — H60502 Unspecified acute noninfective otitis externa, left ear: Secondary | ICD-10-CM | POA: Diagnosis not present

## 2016-10-07 DIAGNOSIS — H6122 Impacted cerumen, left ear: Secondary | ICD-10-CM | POA: Diagnosis not present

## 2016-10-07 MED ORDER — NEOMYCIN-POLYMYXIN-HC 3.5-10000-1 OT SOLN: 4.0000 [drp] | Freq: Four times a day (QID) | OTIC | 0 refills | Status: DC

## 2016-10-07 NOTE — Patient Instructions (Signed)
Thank you for coming in today. Use the ear drops 4x daily for a few days.  Recheck if not better.    Earwax Buildup, Adult The ears produce a substance called earwax that helps keep bacteria out of the ear and protects the skin in the ear canal. Occasionally, earwax can build up in the ear and cause discomfort or hearing loss. What increases the risk? This condition is more likely to develop in people who:  Are male.  Are elderly.  Naturally produce more earwax.  Clean their ears often with cotton swabs.  Use earplugs often.  Use in-ear headphones often.  Wear hearing aids.  Have narrow ear canals.  Have earwax that is overly thick or sticky.  Have eczema.  Are dehydrated.  Have excess hair in the ear canal.  What are the signs or symptoms? Symptoms of this condition include:  Reduced or muffled hearing.  A feeling of fullness in the ear or feeling that the ear is plugged.  Fluid coming from the ear.  Ear pain.  Ear itch.  Ringing in the ear.  Coughing.  An obvious piece of earwax that can be seen inside the ear canal.  How is this diagnosed? This condition may be diagnosed based on:  Your symptoms.  Your medical history.  An ear exam. During the exam, your health care provider will look into your ear with an instrument called an otoscope.  You may have tests, including a hearing test. How is this treated? This condition may be treated by:  Using ear drops to soften the earwax.  Having the earwax removed by a health care provider. The health care provider may: ? Flush the ear with water. ? Use an instrument that has a loop on the end (curette). ? Use a suction device.  Surgery to remove the wax buildup. This may be done in severe cases.  Follow these instructions at home:  Take over-the-counter and prescription medicines only as told by your health care provider.  Do not put any objects, including cotton swabs, into your ear. You can  clean the opening of your ear canal with a washcloth or facial tissue.  Follow instructions from your health care provider about cleaning your ears. Do not over-clean your ears.  Drink enough fluid to keep your urine clear or pale yellow. This will help to thin the earwax.  Keep all follow-up visits as told by your health care provider. If earwax builds up in your ears often or if you use hearing aids, consider seeing your health care provider for routine, preventive ear cleanings. Ask your health care provider how often you should schedule your cleanings.  If you have hearing aids, clean them according to instructions from the manufacturer and your health care provider. Contact a health care provider if:  You have ear pain.  You develop a fever.  You have blood, pus, or other fluid coming from your ear.  You have hearing loss.  You have ringing in your ears that does not go away.  Your symptoms do not improve with treatment.  You feel like the room is spinning (vertigo). Summary  Earwax can build up in the ear and cause discomfort or hearing loss.  The most common symptoms of this condition include reduced or muffled hearing and a feeling of fullness in the ear or feeling that the ear is plugged.  This condition may be diagnosed based on your symptoms, your medical history, and an ear exam.  This condition  may be treated by using ear drops to soften the earwax or by having the earwax removed by a health care provider.  Do not put any objects, including cotton swabs, into your ear. You can clean the opening of your ear canal with a washcloth or facial tissue. This information is not intended to replace advice given to you by your health care provider. Make sure you discuss any questions you have with your health care provider. Document Released: 05/09/2004 Document Revised: 06/12/2016 Document Reviewed: 06/12/2016 Elsevier Interactive Patient Education  2018 [employer].    Otitis Externa Otitis externa is an infection of the outer ear canal. The outer ear canal is the area between the outside of the ear and the eardrum. Otitis externa is sometimes called "swimmer's ear." What are the causes? This condition may be caused by:  Swimming in dirty water.  Moisture in the ear.  An injury to the inside of the ear.  An object stuck in the ear.  A cut or scrape on the outside of the ear.  What increases the risk? This condition is more likely to develop in swimmers. What are the signs or symptoms? The first symptom of this condition is often itching in the ear. Later signs and symptoms include:  Swelling of the ear.  Redness in the ear.  Ear pain. The pain may get worse when you pull on your ear.  Pus coming from the ear.  How is this diagnosed? This condition may be diagnosed by examining the ear and testing fluid from the ear for bacteria and funguses. How is this treated? This condition may be treated with:  Antibiotic ear drops. These are often given for 10-14 days.  Medicine to reduce itching and swelling.  Follow these instructions at home:  If you were prescribed antibiotic ear drops, apply them as told by your health care provider. Do not stop using the antibiotic even if your condition improves.  Take over-the-counter and prescription medicines only as told by your health care provider.  Keep all follow-up visits as told by your health care provider. This is important. How is this prevented?  Keep your ear dry. Use the corner of a towel to dry your ear after you swim or bathe.  Avoid scratching or putting things in your ear. Doing these things can damage the ear canal or remove the protective wax that lines it, which makes it easier for bacteria and funguses to grow.  Avoid swimming in lakes, polluted water, or pools that may not have the right amount of chlorine.  Consider making ear drops and putting 3 or 4 drops in  each ear after you swim. Ask your health care provider about how you can make ear drops. Contact a health care provider if:  You have a fever.  After 3 days your ear is still red, swollen, painful, or draining pus.  Your redness, swelling, or pain gets worse.  You have a severe headache.  You have redness, swelling, pain, or tenderness in the area behind your ear. This information is not intended to replace advice given to you by your health care provider. Make sure you discuss any questions you have with your health care provider. Document Released: 04/01/2005 Document Revised: 05/09/2015 Document Reviewed: 01/09/2015 Elsevier Interactive Patient Education  [employer].

## 2016-10-07 NOTE — Progress Notes (Signed)
William Morton is a 61 y.o. male who presents to [name] today for left-sided ear congestion. Patient notes a one-week history of decreased hearing and increased pressure in the left ear. He denies fevers or chills nausea or vomiting and feels well otherwise.   Past Medical History:  Diagnosis Date  . Atrial fibrillation (HCC)   . Hyperlipidemia   . OSA (obstructive sleep apnea)   . Thyroid disease   . Vitamin D deficiency    Past Surgical History:  Procedure Laterality Date  . CPAP TITRATION  09/22/2015  . EYE SURGERY    . TONSILLECTOMY     Social History  Substance Use Topics  . Smoking status: Never Smoker  . Smokeless tobacco: Never Used  . Alcohol use Yes   family history includes Arrhythmia in his father; Cancer in his brother and father; Heart attack in his mother; Heart disease in his mother.  ROS as above:  Medications: Current Outpatient Prescriptions  Medication Sig Dispense Refill  . atorvastatin (LIPITOR) 20 MG tablet TAKE 1 TABLET(20 MG) BY MOUTH DAILY 30 tablet 3  . cetirizine (ZYRTEC) 10 MG tablet Take 10 mg by mouth daily.    . Cholecalciferol (VITAMIN D PO) Take 300 Units by mouth.    . fluticasone (FLONASE) 50 MCG/ACT nasal spray Place 1 spray into both nostrils daily as needed for allergies.     Marland Kitchen ibuprofen (ADVIL,MOTRIN) 200 MG tablet Take 400 mg by mouth every 6 (six) hours as needed for headache.    . levofloxacin (LEVAQUIN) 500 MG tablet Take 1 tablet (500 mg total) by mouth daily. 7 tablet 0  . levothyroxine (SYNTHROID, LEVOTHROID) 100 MCG tablet TAKE 1 TABLET(100 MCG) BY MOUTH DAILY BEFORE BREAKFAST 90 tablet 1  . metoprolol succinate (TOPROL-XL) 25 MG 24 hr tablet Take 0.5 tablets (12.5 mg total) by mouth daily as needed. 30 tablet 1  . mupirocin nasal ointment (BACTROBAN) 2 % Apply to sore 2-3 times daily for 5 days. 1 g 0    . neomycin-polymyxin-hydrocortisone (CORTISPORIN) OTIC solution Place 4 drops into the left ear 4 (four) times daily. 10 mL 0   No current facility-administered medications for this visit.    Allergies  Allergen Reactions  . Penicillins Swelling and Rash    Swelling in joints    Health Maintenance Health Maintenance  Topic Date Due  . INFLUENZA VACCINE  11/13/2016  . TETANUS/TDAP  04/15/2018  . COLONOSCOPY  04/15/2020  . Hepatitis C Screening  Completed  . HIV Screening  Completed     Exam:  BP 120/85   Pulse 77   Temp 98.3 F (36.8 C) (Oral)   Ht 6' (1.829 m)   Wt 231 lb (104.8 kg)   BMI 31.33 kg/m  Gen: Well NAD HEENT: EOMI,  MMM . Left tympanic membrane is occluded by cerumen right is normal Lungs: Normal work of breathing. CTABL Heart: RRR no MRG Abd: NABS, Soft. Nondistended, Nontender Exts: Brisk capillary refill, warm and well perfused.   A large plug of cerumen was irrigated and removed with curet from the left ear. The ear canal was erythematous and tender following removal of cerumen.  Indication: Cerumen impaction of the ear(s)  Medical necessity statement: On physical examination, cerumen impairs clinically significant portions of the external auditory canal, and tympanic membrane. Noted obstructive, copious cerumen that cannot be removed without magnification and instrumentations requiring physician skills Consent: Discussed benefits and risks of procedure  and verbal consent obtained Procedure: Patient was prepped for the procedure. Utilized an otoscope to assess and take note of the ear canal, the tympanic membrane, and the presence, amount, and placement of the cerumen. Gentle water irrigation and soft plastic curette was utilized to remove cerumen.  Post procedure examination: shows cerumen was completely removed. Patient tolerated procedure well. The patient is made aware that they may experience temporary vertigo, temporary hearing loss, and temporary  discomfort. If these symptom last for more than 24 hours to call the clinic or proceed to the ED.     No results found for this or any previous visit (from the past 72 hour(s)). No results found.    Assessment and Plan: 61 y.o. male with cerumen impaction requiring curet removal as noted above. There appears to be some residual otitis externa. Treat with Cortisporin drops and return as needed.   No orders of the defined types were placed in this encounter.  Meds ordered this encounter  Medications  . neomycin-polymyxin-hydrocortisone (CORTISPORIN) OTIC solution    Sig: Place 4 drops into the left ear 4 (four) times daily.    Dispense:  10 mL    Refill:  0     Discussed warning signs or symptoms. Please see discharge instructions. Patient expresses understanding.

## 2016-11-15 ENCOUNTER — Other Ambulatory Visit: Payer: Self-pay | Admitting: Family Medicine

## 2017-01-10 ENCOUNTER — Ambulatory Visit (INDEPENDENT_AMBULATORY_CARE_PROVIDER_SITE_OTHER): Payer: Federal, State, Local not specified - PPO | Admitting: Internal Medicine

## 2017-01-10 ENCOUNTER — Encounter: Payer: Self-pay | Admitting: Internal Medicine

## 2017-01-10 ENCOUNTER — Telehealth: Payer: Self-pay | Admitting: Internal Medicine

## 2017-01-10 VITALS — BP 102/76 | HR 72 | Ht 72.0 in | Wt 241.6 lb

## 2017-01-10 DIAGNOSIS — Z9989 Dependence on other enabling machines and devices: Secondary | ICD-10-CM

## 2017-01-10 DIAGNOSIS — G4733 Obstructive sleep apnea (adult) (pediatric): Secondary | ICD-10-CM | POA: Diagnosis not present

## 2017-01-10 DIAGNOSIS — I4891 Unspecified atrial fibrillation: Secondary | ICD-10-CM | POA: Diagnosis not present

## 2017-01-10 NOTE — Patient Instructions (Addendum)
Dr. Rennis Golden has recommended that you follow up with in April 2019. You will receive a notice via MyChart when it is time schedule an appointment. Please call our office when you receive this notification.

## 2017-01-10 NOTE — Telephone Encounter (Signed)
Returned the call to the patient. He stated that his heart has been racing today and it is making him feel light headed. It has been running from 60-120. He took his 12.5 mg of Metoprolol (prn) and this has not helped. He has been given an appointment today with Dr. Rennis Golden at 1:30. The patient verbalized his understanding.

## 2017-01-10 NOTE — Telephone Encounter (Signed)
Pt heart is racing a lot,he took his beta blocker,its not helping.Pt wants to know if he can be seen today please.

## 2017-01-10 NOTE — Progress Notes (Signed)
OFFICE NOTE  Chief Complaint:  Fast heart rate  Primary Care Physician: Rodolph Bong, MD  HPI:  William Morton is a 61 yo male [employer] employee with no cardiac history. He has OSA, but has not been compliant recently with CPAP. He says he has had fatigue and non-restorative sleep for a while. He was recently admitted to [facility] for worsening fatigue and palpitations. He was noted to be in a-fib with RVR. He denies chest pain, but was lightheaded and mildly diaphoretic. He reports improvement with cardizem and spontaneously converted to sinus rhythm overnight. CHADSVASC Score is 0. Discussed options with him and will start Eliquis 5 mg BID. I recommended resuming use of his CPAP machine and that he would need another sleep study for titration. After discharge she underwent an echocardiogram which showed normal LV function and mild left atrial enlargement. He reports no recurrent symptoms of palpitations or A. fib. He has felt a little fatigued, but no shortness of breath or heart racing. He was started on low-dose Toprol. He is under a lot of stress in his care for his mother who is sick and this may be playing a role in it. As low normal today at 102/74.  08/22/2015  [name] returns today for follow-up. He reports only one episode of palpitations for which he took a half tablet of Toprol with some improvement. He's not had any other episodes. He reports good energy level and is been compliant with CPAP. He has an upcoming sleep study on June 9 20 may have additional changes to his equipment. He takes Lipitor for dyslipidemia and had well-controlled lipid profile recently. He is hypothyroid on levothyroxine which is followed by his primary care provider.  07/16/2016  [name] returns for follow-up. Over the last year he's had very few episodes of A. fib. He had some episode where he felt like his heart rate was slow with hard heart beats which could indicate PVCs. He reports pretty good  compliance with CPAP. He is concerned about whether his thyroid is well-regulated however in December 2017, his TSH was normal 2.13. Cholesterol that time was excellent as well with total cholesterol 113, triglycerides 84, HDL-C 31 and LDL-C 65 on atorvastatin 20.  01/02/2017  [name] returns today for evaluation of fast heart rate. Yesterday he had noted faster heart rate throughout the evening and early morning. He took a metoprolol but his symptoms did not improve. Heart rate went up over 120 was bouncing around and he was quite symptomatic. He felt that this was his atrial fibrillation again. He took additional 12.5 mg metoprolol this morning and came in for early afternoon appointment. An EKG in the office today however shows he is back in sinus rhythm at 72. Blood pressures at 102/76. He currently feels a little fatigued however better. We again discussed the natural history of his A. fib. This is the first recurrence she's had in well over a year. He is using a metoprolol as needed. He has a low blood pressure and heart rate at rest therefore we've advocated this approach. His CHADSVASC score is 0. This episode of A. fib was less than 24 hours therefore there was no significant increased risk of stroke. I told him that we would likely see recurrent episodes going forward. Options may include daily beta blocker, or considering a pocket pill strategy.  PMHx:  Past Medical History:  Diagnosis Date  . Atrial fibrillation (HCC)   . Hyperlipidemia   .  OSA (obstructive sleep apnea)   . Thyroid disease   . Vitamin D deficiency     Past Surgical History:  Procedure Laterality Date  . CPAP TITRATION  09/22/2015  . EYE SURGERY    . TONSILLECTOMY      FAMHx:  Family History  Problem Relation Age of Onset  . Heart disease Mother   . Heart attack Mother   . Cancer Brother        throat CA  . Arrhythmia Father   . Cancer Father     SOCHx:   reports that he has never smoked. He has never  used smokeless tobacco. He reports that he drinks alcohol. He reports that he does not use drugs.  ALLERGIES:  Allergies  Allergen Reactions  . Penicillins Swelling and Rash    Swelling in joints    ROS: Pertinent items noted in HPI and remainder of comprehensive ROS otherwise negative.  HOME MEDS: Current Outpatient Prescriptions  Medication Sig Dispense Refill  . atorvastatin (LIPITOR) 20 MG tablet TAKE 1 TABLET EVERY DAY 90 tablet 0  . cetirizine (ZYRTEC) 10 MG tablet Take 10 mg by mouth daily.    . Cholecalciferol (VITAMIN D PO) Take 300 Units by mouth.    . fluticasone (FLONASE) 50 MCG/ACT nasal spray Place 1 spray into both nostrils daily as needed for allergies.     Marland Kitchen ibuprofen (ADVIL,MOTRIN) 200 MG tablet Take 400 mg by mouth every 6 (six) hours as needed for headache.    . levofloxacin (LEVAQUIN) 500 MG tablet Take 1 tablet (500 mg total) by mouth daily. 7 tablet 0  . levothyroxine (SYNTHROID, LEVOTHROID) 100 MCG tablet TAKE 1 TABLET EVERY DAY BEFORE BREAKFAST 90 tablet 0  . metoprolol succinate (TOPROL-XL) 25 MG 24 hr tablet Take 0.5 tablets (12.5 mg total) by mouth daily as needed. 30 tablet 1  . mupirocin nasal ointment (BACTROBAN) 2 % Apply to sore 2-3 times daily for 5 days. 1 g 0  . neomycin-polymyxin-hydrocortisone (CORTISPORIN) OTIC solution Place 4 drops into the left ear 4 (four) times daily. 10 mL 0   No current facility-administered medications for this visit.     LABS/IMAGING: No results found for this or any previous visit (from the past 48 hour(s)). No results found.  WEIGHTS: Wt Readings from Last 3 Encounters:  01/10/17 241 lb 9.6 oz (109.6 kg)  10/07/16 231 lb (104.8 kg)  07/16/16 236 lb (107 kg)    VITALS: BP 102/76   Pulse 72   Ht 6' (1.829 m)   Wt 241 lb 9.6 oz (109.6 kg)   BMI 32.77 kg/m   EXAM: General appearance: alert and no distress Neck: no carotid bruit and no JVD Lungs: clear to auscultation bilaterally Heart: regular rate and  rhythm, S1, S2 normal, no murmur, click, rub or gallop Abdomen: soft, non-tender; bowel sounds normal; no masses,  no organomegaly Extremities: extremities normal, atraumatic, no cyanosis or edema Pulses: 2+ and symmetric Skin: Skin color, texture, turgor normal. No rashes or lesions Neurologic: Grossly normal Psych: Pleasant  EKG: Normal sinus rhythm at 72-personally reviewed  ASSESSMENT: 1. Paroxysmal atrial fibrillation, CHADSVASC score 0  2. OSA and CPAP 3. Dyslipidemia 4. Hypothyroidism  PLAN: 1.   Mr. [name] had recurrent PAF last evening however after 2 doses metoprolol converted back to sinus rhythm. He is currently asymptomatic. We discussed options going forward including more regular beta blocker doses or a pocket pill strategy. Since this is only recurrence in the last year and a  half, will continue to monitor symptoms. If he has more recurrences I would favor a pocket pill strategy with flecainide. There is no current indication for anticoagulation given the brief duration and relative infrequency of his episodes. Ultimately if he has more recurrent episodes I recommend low-dose aspirin.  Follow-up as scheduled in 6 months.  Chrystie Nose, MD, [facility] Attending Cardiologist [facility]  Chrystie Nose 01/10/2017, 1:49 PM

## 2017-01-13 DIAGNOSIS — G4733 Obstructive sleep apnea (adult) (pediatric): Secondary | ICD-10-CM | POA: Diagnosis not present

## 2017-03-13 ENCOUNTER — Ambulatory Visit (INDEPENDENT_AMBULATORY_CARE_PROVIDER_SITE_OTHER): Payer: Federal, State, Local not specified - PPO | Admitting: Family Medicine

## 2017-03-13 ENCOUNTER — Encounter: Payer: Self-pay | Admitting: Family Medicine

## 2017-03-13 VITALS — BP 128/86 | HR 80 | Ht 72.0 in | Wt 244.0 lb

## 2017-03-13 DIAGNOSIS — M5136 Other intervertebral disc degeneration, lumbar region: Secondary | ICD-10-CM | POA: Diagnosis not present

## 2017-03-13 DIAGNOSIS — Z23 Encounter for immunization: Secondary | ICD-10-CM | POA: Diagnosis not present

## 2017-03-13 DIAGNOSIS — S39012A Strain of muscle, fascia and tendon of lower back, initial encounter: Secondary | ICD-10-CM

## 2017-03-13 NOTE — Patient Instructions (Signed)
Thank you for coming in today.  Attend PT if not better.  Stay active.     TENS UNIT: This is helpful for muscle pain and spasm.   Search and Purchase a TENS 7000 2nd edition at  www.tenspros.com or www.Amazon.com It should be less than $30.     TENS unit instructions: Do not shower or bathe with the unit on Turn the unit off before removing electrodes or batteries If the electrodes lose stickiness add a drop of water to the electrodes after they are disconnected from the unit and place on plastic sheet. If you continued to have difficulty, call the TENS unit company to purchase more electrodes. Do not apply lotion on the skin area prior to use. Make sure the skin is clean and dry as this will help prolong the life of the electrodes. After use, always check skin for unusual red areas, rash or other skin difficulties. If there are any skin problems, does not apply electrodes to the same area. Never remove the electrodes from the unit by pulling the wires. Do not use the TENS unit or electrodes other than as directed. Do not change electrode placement without consultating your therapist or physician. Keep 2 fingers with between each electrode. Wear time ratio is 2:1, on to off times.    For example on for 30 minutes off for 15 minutes and then on for 30 minutes off for 15 minutes     Lumbosacral Strain Lumbosacral strain is an injury that causes pain in the lower back (lumbosacral spine). This injury usually occurs from overstretching the muscles or ligaments along your spine. A strain can affect one or more muscles or cord-like tissues that connect bones to other bones (ligaments). What are the causes? This condition may be caused by:  A hard, direct hit (blow) to the back.  Excessive stretching of the lower back muscles. This may result from: ? A fall. ? Lifting something heavy. ? Repetitive movements such as bending or crouching.  What increases the risk? The following  factors may increase your risk of getting this condition:  Participating in sports or activities that involve: ? A sudden twist of the back. ? Pushing or pulling motions.  Being overweight or obese.  Having poor strength and flexibility, especially tight hamstrings or weak muscles in the back or abdomen.  Having too much of a curve in the lower back.  Having a pelvis that is tilted forward.  What are the signs or symptoms? The main symptom of this condition is pain in the lower back, at the site of the strain. Pain may extend (radiate) down one or both legs. How is this diagnosed? This condition is diagnosed based on:  Your symptoms.  Your medical history.  A physical exam. ? Your health care provider may push on certain areas of your back to determine the source of your pain. ? You may be asked to bend forward, backward, and side to side to assess the severity of your pain and your range of motion.  Imaging tests, such as: ? X-rays. ? MRI.  How is this treated? Treatment for this condition may include:  Putting heat and cold on the affected area.  Medicines to help relieve pain and relax your muscles (muscle relaxants).  NSAIDs to help reduce swelling and discomfort.  When your symptoms improve, it is important to gradually return to your normal routine as soon as possible to reduce pain, avoid stiffness, and avoid loss of muscle strength. Generally,  symptoms should improve within 6 weeks of treatment. However, recovery time varies. Follow these instructions at home: Managing pain, stiffness, and swelling   If directed, put ice on the injured area during the first 24 hours after your strain. ? Put ice in a plastic bag. ? Place a towel between your skin and the bag. ? Leave the ice on for 20 minutes, 2-3 times a day.  If directed, put heat on the affected area as often as told by your health care provider. Use the heat source that your health care provider  recommends, such as a moist heat pack or a heating pad. ? Place a towel between your skin and the heat source. ? Leave the heat on for 20-30 minutes. ? Remove the heat if your skin turns bright red. This is especially important if you are unable to feel pain, heat, or cold. You may have a greater risk of getting burned. Activity  Rest and return to your normal activities as told by your health care provider. Ask your health care provider what activities are safe for you.  Avoid activities that take a lot of energy for as long as told by your health care provider. General instructions  Take over-the-counter and prescription medicines only as told by your health care provider.  Donot drive or use heavy machinery while taking prescription pain medicine.  Do not use any products that contain nicotine or tobacco, such as cigarettes and e-cigarettes. If you need help quitting, ask your health care provider.  Keep all follow-up visits as told by your health care provider. This is important. How is this prevented?  Use correct form when playing sports and lifting heavy objects.  Use good posture when sitting and standing.  Maintain a healthy weight.  Sleep on a mattress with medium firmness to support your back.  Be safe and responsible while being active to avoid falls.  Do at least 150 minutes of moderate-intensity exercise each week, such as brisk walking or water aerobics. Try a form of exercise that takes stress off your back, such as swimming or stationary cycling.  Maintain physical fitness, including: ? Strength. ? Flexibility. ? Cardiovascular fitness. ? Endurance. Contact a health care provider if:  Your back pain does not improve after 6 weeks of treatment.  Your symptoms get worse. Get help right away if:  Your back pain is severe.  You cannot stand or walk.  You have difficulty controlling when you urinate or when you have a bowel movement.  You feel nauseous  or you vomit.  Your feet get very cold.  You have numbness, tingling, weakness, or problems using your arms or legs.  You develop any of the following: ? Shortness of breath. ? Dizziness. ? Pain in your legs. ? Weakness in your buttocks or legs. ? Discoloration of the skin on your toes or legs. This information is not intended to replace advice given to you by your health care provider. Make sure you discuss any questions you have with your health care provider. Document Released: 01/09/2005 Document Revised: 10/20/2015 Document Reviewed: 09/03/2015 Elsevier Interactive Patient Education  2017 [employer]

## 2017-03-13 NOTE — Progress Notes (Signed)
William GurneyWillard Morton is a 61 y.o. male who [facility] today for back pain.  William MaoWillard has had low back pain now for several days.  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  He notes pain in the left low back worse with flexion and extension and rotation.  He notes the pain is better with rest.  He is tried some over-the-counter medicines which help a little.  He is reluctant to use muscle relaxers or opiates.  He would like to continue to work if possible.  He denies any radiating pain weakness numbness bowel or bladder dysfunction.   Past Medical History:  Diagnosis Date  . Atrial fibrillation (HCC)   . Hyperlipidemia   . OSA (obstructive sleep apnea)   . Thyroid disease   . Vitamin D deficiency    Past Surgical History:  Procedure Laterality Date  . CPAP TITRATION  09/22/2015  . EYE SURGERY    . TONSILLECTOMY     Social History   Tobacco Use  . Smoking status: Never Smoker  . Smokeless tobacco: Never Used  Substance Use Topics  . Alcohol use: Yes     ROS:  As above   Medications: Current Outpatient Medications  Medication Sig Dispense Refill  . atorvastatin (LIPITOR) 20 MG tablet TAKE 1 TABLET EVERY DAY 90 tablet 0  . cetirizine (ZYRTEC) 10 MG tablet Take 10 mg by mouth daily.    . Cholecalciferol (VITAMIN D PO) Take 300 Units by mouth.    . fluticasone (FLONASE) 50 MCG/ACT nasal spray Place 1 spray into both nostrils daily as needed for allergies.     Marland Kitchen. ibuprofen (ADVIL,MOTRIN) 200 MG tablet Take 400 mg by mouth every 6 (six) hours as needed for headache.    . levothyroxine (SYNTHROID, LEVOTHROID) 100 MCG tablet TAKE 1 TABLET EVERY DAY BEFORE BREAKFAST 90 tablet 0  . metoprolol succinate (TOPROL-XL) 25 MG 24 hr tablet Take 0.5 tablets (12.5 mg total) by mouth daily as needed. 30 tablet 1  . mupirocin nasal ointment (BACTROBAN) 2 % Apply to sore  2-3 times daily for 5 days. 1 g 0  . neomycin-polymyxin-hydrocortisone (CORTISPORIN) OTIC solution Place 4 drops into the left ear 4 (four) times daily. 10 mL 0   No current facility-administered medications for this visit.    Allergies  Allergen Reactions  . Penicillins Swelling and Rash    Swelling in joints     Exam:  BP 128/86   Pulse 80   Ht 6' (1.829 m)   Wt 244 lb (110.7 kg)   BMI 33.09 kg/m  General: Well Developed, well nourished, and in no acute distress.  Neuro/Psych: Alert and oriented x3, extra-ocular muscles intact, able to move all 4 extremities, sensation grossly intact. Skin: Warm and dry, no rashes noted.  Respiratory: Not using accessory muscles, speaking in full sentences, trachea midline.  Cardiovascular: Pulses palpable, no extremity edema. Abdomen: Does not appear distended. MSK:  Spine: Nontender to spinal midline.  Tender to palpation left lumbar paraspinal muscle group. Significantly reduced lumbar motion with stiffness. Lower extremity strength is equal normal throughout. Reflexes are equal and normal bilateral lower extremities. Sensation is intact throughout. Normal gait.    No results found for this or any previous visit (from the past 48 hour(s)). No results found.    Assessment and Plan: 61 y.o. male with sacral strain with myofascial disruption.  Plan to  treat with over-the-counter NSAIDs at prescription strength dosing, heating pad, TENS unit, and referral to physical therapy.  Recheck as needed if not improved.  Influenza vaccine given today.    Orders Placed This Encounter  Procedures  . Flu Vaccine QUAD 36+ mos IM    cunningham  . Ambulatory referral to Physical Therapy    Referral Priority:   Routine    Referral Type:   Physical Medicine    Referral Reason:   Specialty Services Required    Requested Specialty:   Physical Therapy   No orders of the defined types were placed in this encounter.   Discussed warning signs or  symptoms. Please see discharge instructions. Patient expresses understanding.

## 2017-03-17 ENCOUNTER — Encounter: Payer: Federal, State, Local not specified - PPO | Admitting: Family Medicine

## 2017-03-19 ENCOUNTER — Encounter: Payer: Self-pay | Admitting: Family Medicine

## 2017-03-19 ENCOUNTER — Ambulatory Visit (INDEPENDENT_AMBULATORY_CARE_PROVIDER_SITE_OTHER): Payer: Federal, State, Local not specified - PPO | Admitting: Family Medicine

## 2017-03-19 VITALS — BP 134/86 | HR 71 | Ht 72.0 in | Wt 244.0 lb

## 2017-03-19 DIAGNOSIS — Z0000 Encounter for general adult medical examination without abnormal findings: Secondary | ICD-10-CM | POA: Diagnosis not present

## 2017-03-19 DIAGNOSIS — E782 Mixed hyperlipidemia: Secondary | ICD-10-CM | POA: Diagnosis not present

## 2017-03-19 DIAGNOSIS — Z23 Encounter for immunization: Secondary | ICD-10-CM | POA: Diagnosis not present

## 2017-03-19 DIAGNOSIS — E039 Hypothyroidism, unspecified: Secondary | ICD-10-CM | POA: Diagnosis not present

## 2017-03-19 NOTE — Patient Instructions (Signed)
Thank you for coming in today. Get labs now.  I will get results to you tomorrow or Friday.  Continue current medicines.  Recheck in 6-12 months as long as things are going well.

## 2017-03-19 NOTE — Progress Notes (Signed)
TDAP

## 2017-03-19 NOTE — Progress Notes (Signed)
William Morton is a 61 y.o. male who [name] today for well adult visit.   William Morton is doing well with the below medical problems.  Notes his medication list below is correct.  He feels well with no acute issues today.  He was recently seen for back pain and notes that is feeling much better.  He gets exercise at work but does not get an opportunity for a formal exercise.  He is trying to watch his diet however.  He notes stress at home.  He is caring for his infirmed sick mother who is on the razor edge of living independently with help versus being in a nursing home or assisted living facility.  She previously was in crisis but they have since sold her house which allows her to afford personal care services which allow her to live independently.  He notes he still stressed but overall much better than he was in the past.   Past Medical History:  Diagnosis Date  . Atrial fibrillation (HCC)   . Hyperlipidemia   . OSA (obstructive sleep apnea)   . Thyroid disease   . Vitamin D deficiency    Past Surgical History:  Procedure Laterality Date  . CPAP TITRATION  09/22/2015  . EYE SURGERY    . TONSILLECTOMY     Social History   Tobacco Use  . Smoking status: Never Smoker  . Smokeless tobacco: Never Used  Substance Use Topics  . Alcohol use: Yes   family history includes Arrhythmia in his father; Cancer in his brother and father; Heart attack in his mother; Heart disease in his mother.  ROS as above:  Medications: Current Outpatient Medications  Medication Sig Dispense Refill  . atorvastatin (LIPITOR) 20 MG tablet TAKE 1 TABLET EVERY DAY 90 tablet 0  . cetirizine (ZYRTEC) 10 MG tablet Take 10 mg by mouth daily.    . Cholecalciferol (VITAMIN D PO) Take 300 Units by mouth.    . fluticasone (FLONASE) 50 MCG/ACT nasal spray Place 1 spray into both  nostrils daily as needed for allergies.     Marland Kitchen. ibuprofen (ADVIL,MOTRIN) 200 MG tablet Take 400 mg by mouth every 6 (six) hours as needed for headache.    . levothyroxine (SYNTHROID, LEVOTHROID) 100 MCG tablet TAKE 1 TABLET EVERY DAY BEFORE BREAKFAST 90 tablet 0  . metoprolol succinate (TOPROL-XL) 25 MG 24 hr tablet Take 0.5 tablets (12.5 mg total) by mouth daily as needed. 30 tablet 1  . mupirocin nasal ointment (BACTROBAN) 2 % Apply to sore 2-3 times daily for 5 days. 1 g 0  . neomycin-polymyxin-hydrocortisone (CORTISPORIN) OTIC solution Place 4 drops into the left ear 4 (four) times daily. 10 mL 0   No current facility-administered medications for this visit.    Allergies  Allergen Reactions  . Penicillins Swelling and Rash    Swelling in joints    Health Maintenance Health Maintenance  Topic Date Due  . TETANUS/TDAP  04/15/2018  . COLONOSCOPY  04/15/2020  . INFLUENZA VACCINE  Completed  . Hepatitis C Screening  Completed  . HIV Screening  Completed     Exam:  BP 134/86   Pulse 71   Ht 6' (1.829 m)   Wt 244 lb (110.7 kg)   BMI 33.09 kg/m  Gen: Well NAD HEENT: EOMI,  MMM Lungs: Normal work of breathing. CTABL Heart: RRR no MRG Abd: NABS, Soft. Nondistended, Nontender Exts: Brisk capillary refill,  warm and well perfused.  Skin: No dysplastic appearing skin changes on torso face or extremities   No results found for this or any previous visit (from the past 72 hour(s)). No results found.    Assessment and Plan: 61 y.o. male with well adult.  Doing reasonably well.  Vital signs are reasonable.  We discussed the importance of trying to eat a healthy lower calorie diet.  Medications are appropriate. Plan to check basic fasting labs listed below and recheck in 6-12 months or sooner if needed.  Tdap given prior to discharge. Influenza vaccine given recently. Lengthy discussion about pros and cons of PSA testing. William Morton declines at this time.   Orders Placed This  Encounter  Procedures  . Tdap vaccine greater than or equal to 7yo IM  . CBC  . COMPLETE METABOLIC PANEL WITH GFR  . Lipid Panel w/reflex Direct LDL  . TSH   No orders of the defined types were placed in this encounter.    Discussed warning signs or symptoms. Please see discharge instructions. Patient expresses understanding.

## 2017-03-20 LAB — LIPID PANEL W/REFLEX DIRECT LDL
CHOL/HDL RATIO: 3.6 (calc) (ref <5.0)
Cholesterol: 122 mg/dL (ref <200)
HDL: 34 mg/dL — ABNORMAL LOW (ref >40)
LDL CHOLESTEROL (CALC): 70 mg/dL
Non-HDL Cholesterol (Calc): 88 mg/dL (calc) (ref <130)
TRIGLYCERIDES: 98 mg/dL (ref <150)

## 2017-03-20 LAB — CBC
HEMATOCRIT: 45.5 % (ref 38.5–50.0)
Hemoglobin: 15.8 g/dL (ref 13.2–17.1)
MCH: 31.5 pg (ref 27.0–33.0)
MCHC: 34.7 g/dL (ref 32.0–36.0)
MCV: 90.8 fL (ref 80.0–100.0)
MPV: 10.8 fL (ref 7.5–12.5)
PLATELETS: 225 10*3/uL (ref 140–400)
RBC: 5.01 10*6/uL (ref 4.20–5.80)
RDW: 12.4 % (ref 11.0–15.0)
WBC: 7.2 10*3/uL (ref 3.8–10.8)

## 2017-03-20 LAB — COMPLETE METABOLIC PANEL WITH GFR
AG Ratio: 1.8 (calc) (ref 1.0–2.5)
ALBUMIN MSPROF: 4.6 g/dL (ref 3.6–5.1)
ALKALINE PHOSPHATASE (APISO): 69 U/L (ref 40–115)
ALT: 35 U/L (ref 9–46)
AST: 29 U/L (ref 10–35)
BUN: 15 mg/dL (ref 7–25)
CALCIUM: 9.8 mg/dL (ref 8.6–10.3)
CHLORIDE: 104 mmol/L (ref 98–110)
CO2: 30 mmol/L (ref 20–32)
CREATININE: 1.07 mg/dL (ref 0.70–1.33)
GFR, EST AFRICAN AMERICAN: 88 mL/min/{1.73_m2} (ref >=60)
GFR, Est Non African American: 76 mL/min/{1.73_m2} (ref >=60)
GLUCOSE: 93 mg/dL (ref 65–99)
Globulin: 2.5 g/dL (calc) (ref 1.9–3.7)
Potassium: 4.2 mmol/L (ref 3.5–5.3)
Sodium: 141 mmol/L (ref 135–146)
TOTAL PROTEIN: 7.1 g/dL (ref 6.1–8.1)
Total Bilirubin: 0.9 mg/dL (ref 0.2–1.2)

## 2017-03-20 LAB — TSH: TSH: 1.12 m[IU]/L (ref 0.40–4.50)

## 2017-03-21 ENCOUNTER — Other Ambulatory Visit: Payer: Self-pay | Admitting: Family Medicine

## 2017-03-21 MED ORDER — LEVOTHYROXINE SODIUM 100 MCG PO TABS: 100.0000 ug | ORAL_TABLET | Freq: Every day | ORAL | 1 refills | Status: DC

## 2017-03-31 ENCOUNTER — Encounter: Payer: Self-pay | Admitting: Family Medicine

## 2017-05-14 ENCOUNTER — Other Ambulatory Visit: Payer: Self-pay

## 2017-05-14 MED ORDER — LEVOTHYROXINE SODIUM 100 MCG PO TABS: 100.0000 ug | ORAL_TABLET | Freq: Every day | ORAL | 1 refills | Status: DC

## 2017-05-14 NOTE — Telephone Encounter (Signed)
REFILL REQUEST SENT FROM [facility] FOR LEVOTHYROXINE. #90 WAS SENT TO PHARMACY.Rhonda Cunningham,CMA

## 2017-07-18 ENCOUNTER — Other Ambulatory Visit: Payer: Self-pay | Admitting: Family Medicine

## 2017-11-05 ENCOUNTER — Other Ambulatory Visit: Payer: Self-pay | Admitting: Family Medicine

## 2018-01-06 ENCOUNTER — Other Ambulatory Visit: Payer: Self-pay | Admitting: Family Medicine

## 2018-02-10 ENCOUNTER — Other Ambulatory Visit: Payer: Self-pay | Admitting: Family Medicine

## 2018-02-16 DIAGNOSIS — G4733 Obstructive sleep apnea (adult) (pediatric): Secondary | ICD-10-CM | POA: Diagnosis not present

## 2018-02-26 ENCOUNTER — Ambulatory Visit (INDEPENDENT_AMBULATORY_CARE_PROVIDER_SITE_OTHER): Payer: Federal, State, Local not specified - PPO | Admitting: Family Medicine

## 2018-02-26 DIAGNOSIS — Z23 Encounter for immunization: Secondary | ICD-10-CM

## 2018-03-08 ENCOUNTER — Encounter: Payer: Self-pay | Admitting: Emergency Medicine

## 2018-03-08 ENCOUNTER — Emergency Department
Admission: EM | Admit: 2018-03-08 | Discharge: 2018-03-08 | Disposition: A | Discharge status: Home / Self Care | Payer: Federal, State, Local not specified - PPO | Source: Home / Self Care | Attending: Family Medicine | Admitting: Family Medicine

## 2018-03-08 ENCOUNTER — Other Ambulatory Visit: Payer: Self-pay

## 2018-03-08 DIAGNOSIS — R69 Illness, unspecified: Secondary | ICD-10-CM

## 2018-03-08 DIAGNOSIS — J111 Influenza due to unidentified influenza virus with other respiratory manifestations: Secondary | ICD-10-CM

## 2018-03-08 LAB — POCT INFLUENZA A/B
INFLUENZA B, POC: NEGATIVE
Influenza A, POC: NEGATIVE

## 2018-03-08 MED ORDER — IBUPROFEN 600 MG PO TABS
600.0000 mg | ORAL_TABLET | Freq: Once | ORAL | Status: AC
Start: 2018-03-08 — End: 2018-03-08
  Administered 2018-03-08: 600 mg via ORAL

## 2018-03-08 MED ORDER — OSELTAMIVIR PHOSPHATE 75 MG PO CAPS: 75.0000 mg | ORAL_CAPSULE | Freq: Two times a day (BID) | ORAL | 0 refills | Status: DC

## 2018-03-08 NOTE — Discharge Instructions (Addendum)
If increased cough and congestion develop, try the following: Take plain guaifenesin (1200mg  extended release tabs such as Mucinex) twice daily, with plenty of water, for cough and congestion. Get adequate rest.   May use Afrin nasal spray (or generic oxymetazoline) each morning for about 5 days and then discontinue.  Also recommend using saline nasal spray several times daily and saline nasal irrigation (AYR is a common brand).  Use Flonase nasal spray each morning after using Afrin nasal spray and saline nasal irrigation. Try warm salt water gargles for sore throat.  Stop all antihistamines for now, and other non-prescription cough/cold preparations. May take Ibuprofen 200mg , 4 tabs every 8 hours with food for headache, body aches, fever, etc. May take Delsym Cough Suppressant at bedtime for nighttime cough.

## 2018-03-08 NOTE — ED Triage Notes (Signed)
Patient started having congestion, fatigue and aches today with sense of fever; no OTCs.

## 2018-03-08 NOTE — ED Provider Notes (Signed)
[facility]    CSN: [id number] Arrival date & time: 03/08/18  1311     History   Chief Complaint Chief Complaint  Patient presents with  . Nasal Congestion  . Fever  . Generalized Body Aches  . Fatigue    HPI William Morton is a 62 y.o. male.   At about 2am today patient developed headache, chills with sense of fever, fatigue, sinus congestion, and myalgias.  No sore throat or cough.  The history is provided by the patient.    Past Medical History:  Diagnosis Date  . Atrial fibrillation (HCC)   . Hyperlipidemia   . OSA (obstructive sleep apnea)   . Thyroid disease   . Vitamin D deficiency     Patient Active Problem List   Diagnosis Date Noted  . Asymptomatic microscopic hematuria 10/13/2015  . OSA on CPAP 08/22/2015  . Lesion of penis 08/01/2015  . Anxiety state 08/01/2015  . H/O cataract extraction 06/13/2015  . Hyperlipidemia   . Atrial fibrillation (HCC)   . Vitamin D deficiency 03/17/2015  . HLD (hyperlipidemia) 03/16/2015  . Hypothyroid 03/16/2015  . Lumbar degenerative disc disease 09/21/2014    Past Surgical History:  Procedure Laterality Date  . CPAP TITRATION  09/22/2015  . EYE SURGERY    . TONSILLECTOMY         Home Medications    Prior to Admission medications   Medication Sig Start Date End Date Taking? Authorizing Provider  atorvastatin (LIPITOR) 20 MG tablet TAKE 1 TABLET EVERY DAY 02/10/18   Rodolph Bong, MD  cetirizine (ZYRTEC) 10 MG tablet Take 10 mg by mouth daily.    [provider]  Cholecalciferol (VITAMIN D PO) Take 300 Units by mouth.    [provider]  fluticasone (FLONASE) 50 MCG/ACT nasal spray Place 1 spray into both nostrils daily as needed for allergies.     [provider]  ibuprofen (ADVIL,MOTRIN) 200 MG tablet Take 400 mg by mouth every 6 (six) hours as needed for headache.    [provider]  levothyroxine (SYNTHROID, LEVOTHROID) 100 MCG tablet TAKE 1 TABLET BY  MOUTH EVERY DAY 11/05/17   Rodolph Bong, MD  metoprolol succinate (TOPROL-XL) 25 MG 24 hr tablet Take 0.5 tablets (12.5 mg total) by mouth daily as needed. 07/16/16   Hilty, Lisette Abu, MD  mupirocin nasal ointment (BACTROBAN) 2 % Apply to sore 2-3 times daily for 5 days. 09/30/15   Lurene Shadow, PA-C  neomycin-polymyxin-hydrocortisone (CORTISPORIN) OTIC solution Place 4 drops into the left ear 4 (four) times daily. 10/07/16   Rodolph Bong, MD  oseltamivir (TAMIFLU) 75 MG capsule Take 1 capsule (75 mg total) by mouth every 12 (twelve) hours. 03/08/18   Lattie Haw, MD    Family History Family History  Problem Relation Age of Onset  . Heart disease Mother   . Heart attack Mother   . Cancer Brother        throat CA  . Arrhythmia Father   . Cancer Father     Social History Social History   Tobacco Use  . Smoking status: Never Smoker  . Smokeless tobacco: Never Used  Substance Use Topics  . Alcohol use: Yes  . Drug use: No     Allergies   Penicillins   Review of Systems Review of Systems No sore throat No cough No pleuritic pain No wheezing + nasal congestion + post-nasal drainage No sinus pain/pressure No itchy/red eyes No earache No  hemoptysis No SOB + fever, + chills No nausea No vomiting No abdominal pain No diarrhea No urinary symptoms No skin rash + fatigue + myalgias + headache Used OTC meds without relief   Physical Exam Triage Vital Signs ED Triage Vitals  Enc Vitals Group     BP 03/08/18 1433 138/80     Pulse Rate 03/08/18 1433 (!) 106     Resp 03/08/18 1433 18     Temp 03/08/18 1433 (!) 100.8 F (38.2 C)     Temp Source 03/08/18 1433 Oral     SpO2 03/08/18 1433 96 %     Weight 03/08/18 1435 230 lb (104.3 kg)     Height 03/08/18 1435 6' (1.829 m)     Head Circumference --      Peak Flow --      Pain Score 03/08/18 1434 1     Pain Loc --      Pain Edu? --      Excl. in GC? --    No data found.  Updated Vital Signs BP 138/80 (BP  Location: Right Arm)   Pulse (!) 106   Temp (!) 100.8 F (38.2 C) (Oral)   Resp 18   Ht 6' (1.829 m)   Wt 104.3 kg   SpO2 96%   BMI 31.19 kg/m   Visual Acuity Right Eye Distance:   Left Eye Distance:   Bilateral Distance:    Right Eye Near:   Left Eye Near:    Bilateral Near:     Physical Exam Nursing notes and Vital Signs reviewed. Appearance:  Patient appears stated age, and in no acute distress Eyes:  Pupils are equal, round, and reactive to light and accomodation.  Extraocular movement is intact.  Conjunctivae are not inflamed  Ears:  Canals normal.  Tympanic membranes normal.  Nose:  Mildly congested turbinates.  No sinus tenderness.  Pharynx:  Normal Neck:  Supple.  Enlarged posterior/lateral nodes are palpated bilaterally, tender to palpation on the left.   Lungs:  Clear to auscultation.  Breath sounds are equal.  Moving air well. Heart:  Regular rate and rhythm without murmurs, rubs, or gallops.  Rate 104 Abdomen:  Nontender without masses or hepatosplenomegaly.  Bowel sounds are present.  No CVA or flank tenderness.  Extremities:  No edema.  Skin:  No rash present.    UC Treatments / Results  Labs (all labs ordered are listed, but only abnormal results are displayed) Labs Reviewed  POCT INFLUENZA A/B negative    EKG None  Radiology No results found.  Procedures Procedures (including critical care time)  Medications Ordered in UC Medications  ibuprofen (ADVIL,MOTRIN) tablet 600 mg (600 mg Oral Given 03/08/18 1437)    Initial Impression / Assessment and Plan / UC Course  I have reviewed the triage vital signs and the nursing notes.  Pertinent labs & imaging results that were available during my care of the patient were reviewed by me and considered in my medical decision making (see chart for details).    ?false negative flu test.  Begin Tamiflu. Followup with Family Doctor if not improved in about 5 days.   Final Clinical Impressions(s) / UC  Diagnoses   Final diagnoses:  Influenza-like illness     Discharge Instructions     If increased cough and congestion develop, try the following: Take plain guaifenesin (1200mg  extended release tabs such as Mucinex) twice daily, with plenty of water, for cough and congestion. Get adequate rest.  May use Afrin nasal spray (or generic oxymetazoline) each morning for about 5 days and then discontinue.  Also recommend using saline nasal spray several times daily and saline nasal irrigation (AYR is a common brand).  Use Flonase nasal spray each morning after using Afrin nasal spray and saline nasal irrigation. Try warm salt water gargles for sore throat.  Stop all antihistamines for now, and other non-prescription cough/cold preparations. May take Ibuprofen 200mg , 4 tabs every 8 hours with food for headache, body aches, fever, etc. May take Delsym Cough Suppressant at bedtime for nighttime cough.     ED Prescriptions    Medication Sig Dispense Auth. Provider   oseltamivir (TAMIFLU) 75 MG capsule Take 1 capsule (75 mg total) by mouth every 12 (twelve) hours. 10 capsule Lattie HawBeese, Korinne Greenstein A, MD        Lattie HawBeese, Yazlyn Wentzel A, MD 03/10/18 [phone]

## 2018-03-12 ENCOUNTER — Other Ambulatory Visit: Payer: Self-pay | Admitting: Family Medicine

## 2018-03-17 ENCOUNTER — Ambulatory Visit (INDEPENDENT_AMBULATORY_CARE_PROVIDER_SITE_OTHER): Payer: Federal, State, Local not specified - PPO | Admitting: Family Medicine

## 2018-03-17 ENCOUNTER — Encounter: Payer: Self-pay | Admitting: Family Medicine

## 2018-03-17 VITALS — BP 130/76 | HR 70 | Temp 98.5°F | Ht 72.0 in | Wt 243.0 lb

## 2018-03-17 DIAGNOSIS — Z6832 Body mass index (BMI) 32.0-32.9, adult: Secondary | ICD-10-CM

## 2018-03-17 DIAGNOSIS — E039 Hypothyroidism, unspecified: Secondary | ICD-10-CM | POA: Diagnosis not present

## 2018-03-17 DIAGNOSIS — J0100 Acute maxillary sinusitis, unspecified: Secondary | ICD-10-CM

## 2018-03-17 DIAGNOSIS — E782 Mixed hyperlipidemia: Secondary | ICD-10-CM | POA: Diagnosis not present

## 2018-03-17 DIAGNOSIS — I4891 Unspecified atrial fibrillation: Secondary | ICD-10-CM | POA: Diagnosis not present

## 2018-03-17 MED ORDER — AZITHROMYCIN 250 MG PO TABS: 250.0000 mg | ORAL_TABLET | Freq: Every day | ORAL | 0 refills | Status: DC

## 2018-03-17 NOTE — Progress Notes (Signed)
William Morton is a 62 y.o. male who presents to [name] today for sinus pain and pressure. [name] became ill over 1 week ago.  He was seen in urgent care on November 24.  Rapid flu test was negative but he was thought to have influenza-like illness and prescribed Tamiflu.  He notes that helped and he started feeling better until a few days ago when he worsened again.  He notes sinus pain and pressure congestion runny nose.  He denies significant shortness of breath or cough.  He does have a history of atrial fibrillation and notes that a few days ago he was having palpitations and feeling a bit fatigued with exertion.  He is feeling back to normal again now.  He continues to take his metoprolol.  He notes that he has an upcoming appointment with his cardiologist in the near future.  Additionally he notes that he is due for recheck of his chronic medical problems including hypertension, hyperlipidemia, hypothyroidism.  He takes medications listed below.  For the most part he feels quite well.   ROS as above:  Exam:  BP 130/76   Pulse 70   Temp 98.5 F (36.9 C) (Oral)   Ht 6' (1.829 m)   Wt 243 lb (110.2 kg)   SpO2 96%   BMI 32.96 kg/m  Wt Readings from Last 5 Encounters:  03/17/18 243 lb (110.2 kg)  03/08/18 230 lb (104.3 kg)  03/19/17 244 lb (110.7 kg)  03/13/17 244 lb (110.7 kg)  01/10/17 241 lb 9.6 oz (109.6 kg)    Gen: Well NAD HEENT: EOMI,  MMM clear nasal discharge.  Inflamed nasal turbinates.  Normal tympanic membrane right.  Mild cerumen left.  Mild cervical lymphadenopathy.  Normal posterior pharynx.  Tender to palpation maxillary and frontal sinuses. Lungs: Normal work of breathing. CTABL Heart: RRR no MRG Abd: NABS, Soft. Nondistended, Nontender Exts: Brisk capillary refill, warm and well perfused.   Depression screen [facility] 2/9 03/17/2018  03/19/2017 03/18/2016  Decreased Interest 0 0 0  Down, Depressed, Hopeless 0 0 0  PHQ - 2 Score 0 0 0    Lab and Radiology Results No results found for this or any previous visit (from the past 72 hour(s)). No results found.    Assessment and Plan: 62 y.o. male with  Sinusitis with secondary sickening.  Continue over-the-counter medications and use azithromycin as prescribed below.  If not improving patient will let me know and I will likely prescribe prednisone and Omnicef.  Check as needed.  Atrial fibrillation: In regular rhythm per my exam today.  Continue metoprolol.  Follow-up with cardiology as arranged.  Check basic fasting labs in the near future.  Hyperlipidemia and hypothyroidism: Doing well continue current regimen.  Check basic fasting labs in a few weeks.  Recommend patient schedule a wellness visit in the near future.  Will address obesity at that visit as well as his other pending health issues.  Briefly discussed diet today.   Orders Placed This Encounter  Procedures  . CBC  . COMPLETE METABOLIC PANEL WITH GFR  . Lipid Panel w/reflex Direct LDL  . TSH   Meds ordered this encounter  Medications  . azithromycin (ZITHROMAX) 250 MG tablet    Sig: Take 1 tablet (250 mg total) by mouth daily. Take first 2 tablets together, then 1 every day until finished.    Dispense:  6 tablet    Refill:  0  Historical information moved to improve visibility of documentation.  Past Medical History:  Diagnosis Date  . Atrial fibrillation (HCC)   . Hyperlipidemia   . OSA (obstructive sleep apnea)   . Thyroid disease   . Vitamin D deficiency    Past Surgical History:  Procedure Laterality Date  . CPAP TITRATION  09/22/2015  . EYE SURGERY    . TONSILLECTOMY     Social History   Tobacco Use  . Smoking status: Never Smoker  . Smokeless tobacco: Never Used  Substance Use Topics  . Alcohol use: Yes   family history includes Arrhythmia in his father; Cancer in his  brother and father; Heart attack in his mother; Heart disease in his mother.  Medications: Current Outpatient Medications  Medication Sig Dispense Refill  . atorvastatin (LIPITOR) 20 MG tablet TAKE 1 TABLET EVERY DAY 30 tablet 0  . cetirizine (ZYRTEC) 10 MG tablet Take 10 mg by mouth daily.    . Cholecalciferol (VITAMIN D PO) Take 300 Units by mouth.    . fluticasone (FLONASE) 50 MCG/ACT nasal spray Place 1 spray into both nostrils daily as needed for allergies.     Marland Kitchen. ibuprofen (ADVIL,MOTRIN) 200 MG tablet Take 400 mg by mouth every 6 (six) hours as needed for headache.    . levothyroxine (SYNTHROID, LEVOTHROID) 100 MCG tablet TAKE 1 TABLET BY MOUTH EVERY DAY 90 tablet 1  . metoprolol succinate (TOPROL-XL) 25 MG 24 hr tablet Take 0.5 tablets (12.5 mg total) by mouth daily as needed. 30 tablet 1  . mupirocin nasal ointment (BACTROBAN) 2 % Apply to sore 2-3 times daily for 5 days. 1 g 0  . neomycin-polymyxin-hydrocortisone (CORTISPORIN) OTIC solution Place 4 drops into the left ear 4 (four) times daily. 10 mL 0  . azithromycin (ZITHROMAX) 250 MG tablet Take 1 tablet (250 mg total) by mouth daily. Take first 2 tablets together, then 1 every day until finished. 6 tablet 0   No current facility-administered medications for this visit.    Allergies  Allergen Reactions  . Penicillins Swelling and Rash    Swelling in joints     Discussed warning signs or symptoms. Please see discharge instructions. Patient expresses understanding.

## 2018-03-17 NOTE — Patient Instructions (Addendum)
Thank you for coming in today. Continue over the counter medicine.  Take azithromycin antibiotic.  If not better next step is prednisone or a different antibiotic.  Let me know by Thursday or Friday morning.   Get fasting labs in the near future.   We should consider a well adult visit in the future.    Sinusitis, Adult Sinusitis is soreness and inflammation of your sinuses. Sinuses are hollow spaces in the bones around your face. They are located:  Around your eyes.  In the middle of your forehead.  Behind your nose.  In your cheekbones.  Your sinuses and nasal passages are lined with a stringy fluid (mucus). Mucus normally drains out of your sinuses. When your nasal tissues get inflamed or swollen, the mucus can get trapped or blocked so air cannot flow through your sinuses. This lets bacteria, viruses, and funguses grow, and that leads to infection. Follow these instructions at home: Medicines  Take, use, or apply over-the-counter and prescription medicines only as told by your doctor. These may include nasal sprays.  If you were prescribed an antibiotic medicine, take it as told by your doctor. Do not stop taking the antibiotic even if you start to feel better. Hydrate and Humidify  Drink enough water to keep your pee (urine) clear or pale yellow.  Use a cool mist humidifier to keep the humidity level in your home above 50%.  Breathe in steam for 10-15 minutes, 3-4 times a day or as told by your doctor. You can do this in the bathroom while a hot shower is running.  Try not to spend time in cool or dry air. Rest  Rest as much as possible.  Sleep with your head raised (elevated).  Make sure to get enough sleep each night. General instructions  Put a warm, moist washcloth on your face 3-4 times a day or as told by your doctor. This will help with discomfort.  Wash your hands often with soap and water. If there is no soap and water, use hand sanitizer.  Do not  smoke. Avoid being around people who are smoking (secondhand smoke).  Keep all follow-up visits as told by your doctor. This is important. Contact a doctor if:  You have a fever.  Your symptoms get worse.  Your symptoms do not get better within 10 days. Get help right away if:  You have a very bad headache.  You cannot stop throwing up (vomiting).  You have pain or swelling around your face or eyes.  You have trouble seeing.  You feel confused.  Your neck is stiff.  You have trouble breathing. This information is not intended to replace advice given to you by your health care provider. Make sure you discuss any questions you have with your health care provider. Document Released: 09/18/2007 Document Revised: 11/26/2015 Document Reviewed: 01/25/2015 Elsevier Interactive Patient Education  [employer]2018 [employer]

## 2018-03-18 ENCOUNTER — Telehealth: Payer: Self-pay

## 2018-03-18 MED ORDER — AZITHROMYCIN 250 MG PO TABS: 250.0000 mg | ORAL_TABLET | Freq: Every day | ORAL | 0 refills | Status: DC

## 2018-03-18 NOTE — Telephone Encounter (Signed)
Azithromycin was sent to the wrong pharmacy. I corrected it and sent it to [facility], patient preference. Rease Wence,CMA

## 2018-03-30 IMAGING — DX DG CHEST 2V
2 series · 2 of 2 positions shown · non-contrast
Comparison: 05/04/2015

CLINICAL DATA: Cough and fever

EXAM:
CHEST  2 VIEW

[chest pa]
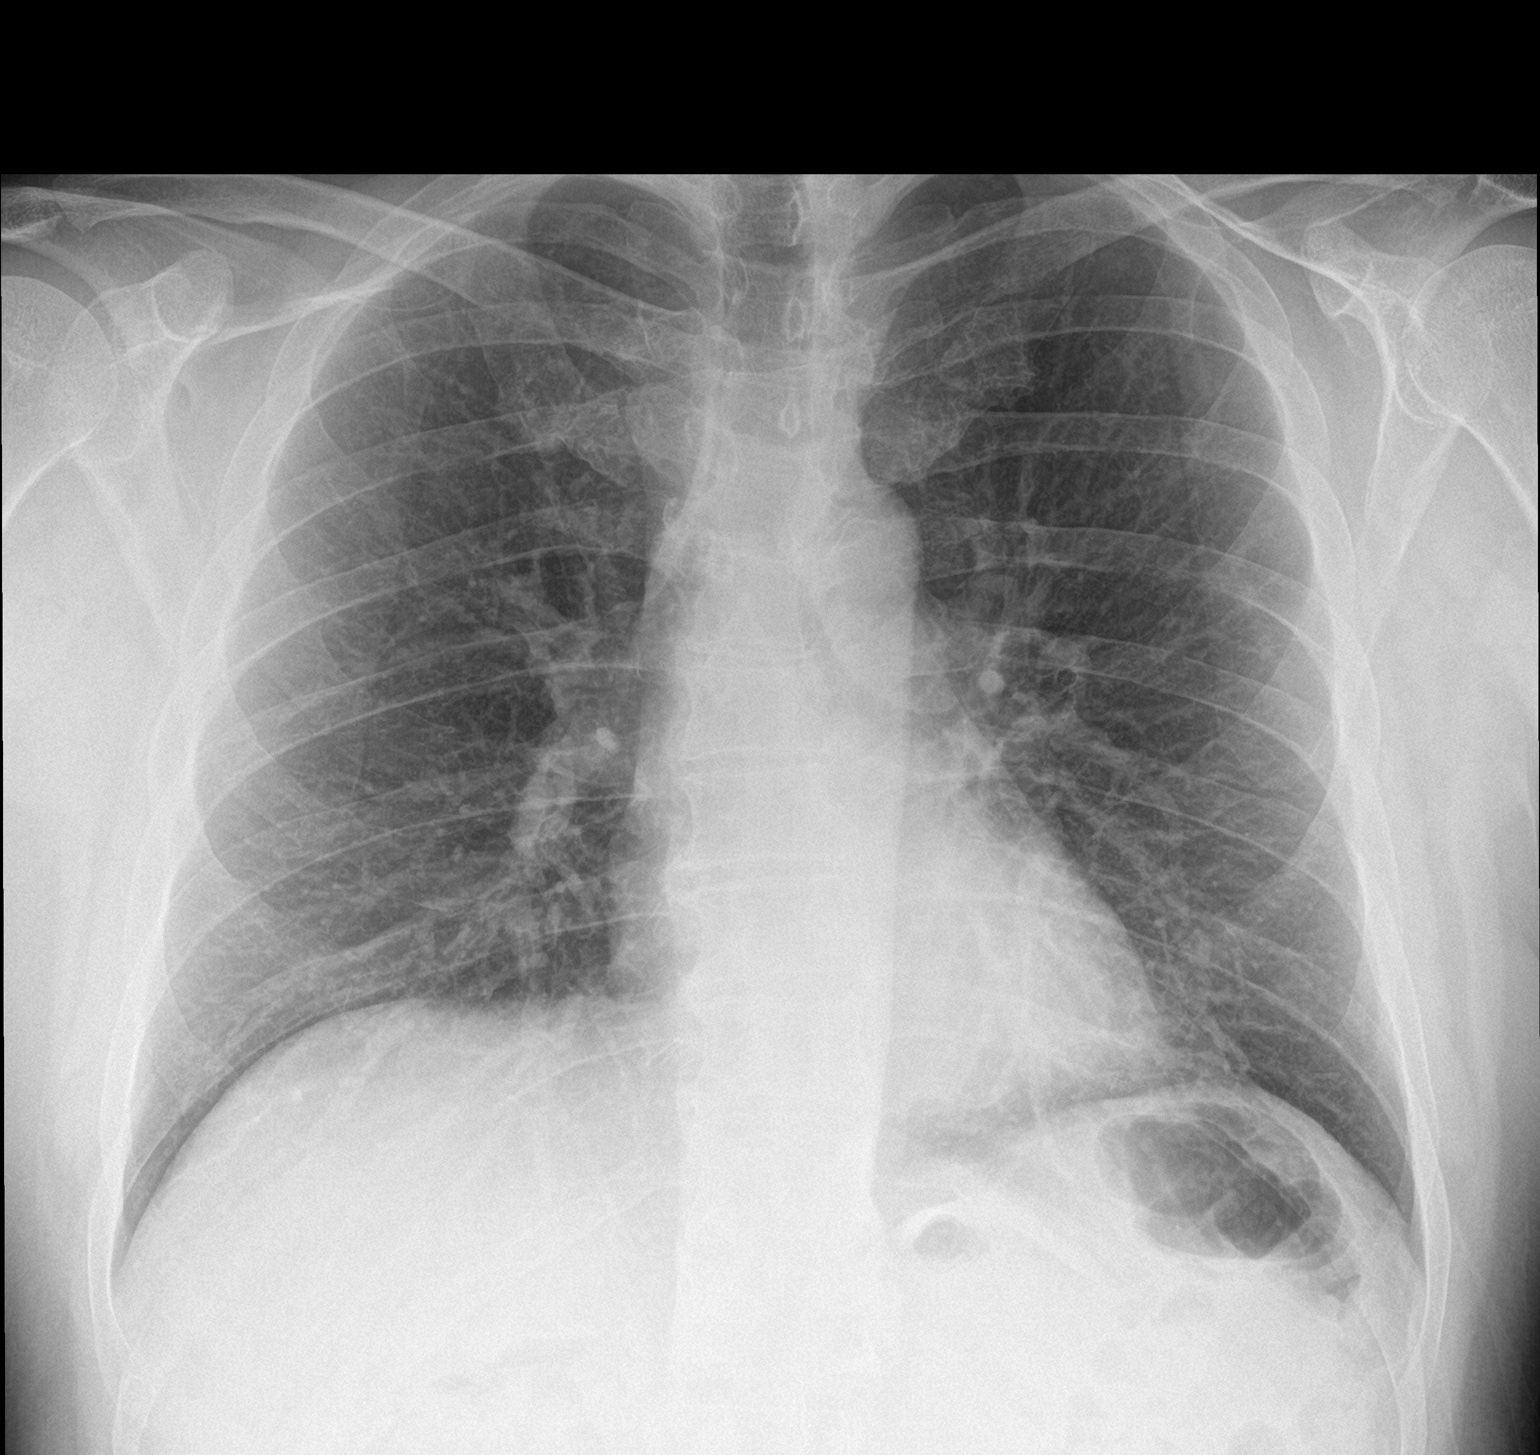

[chest lat]
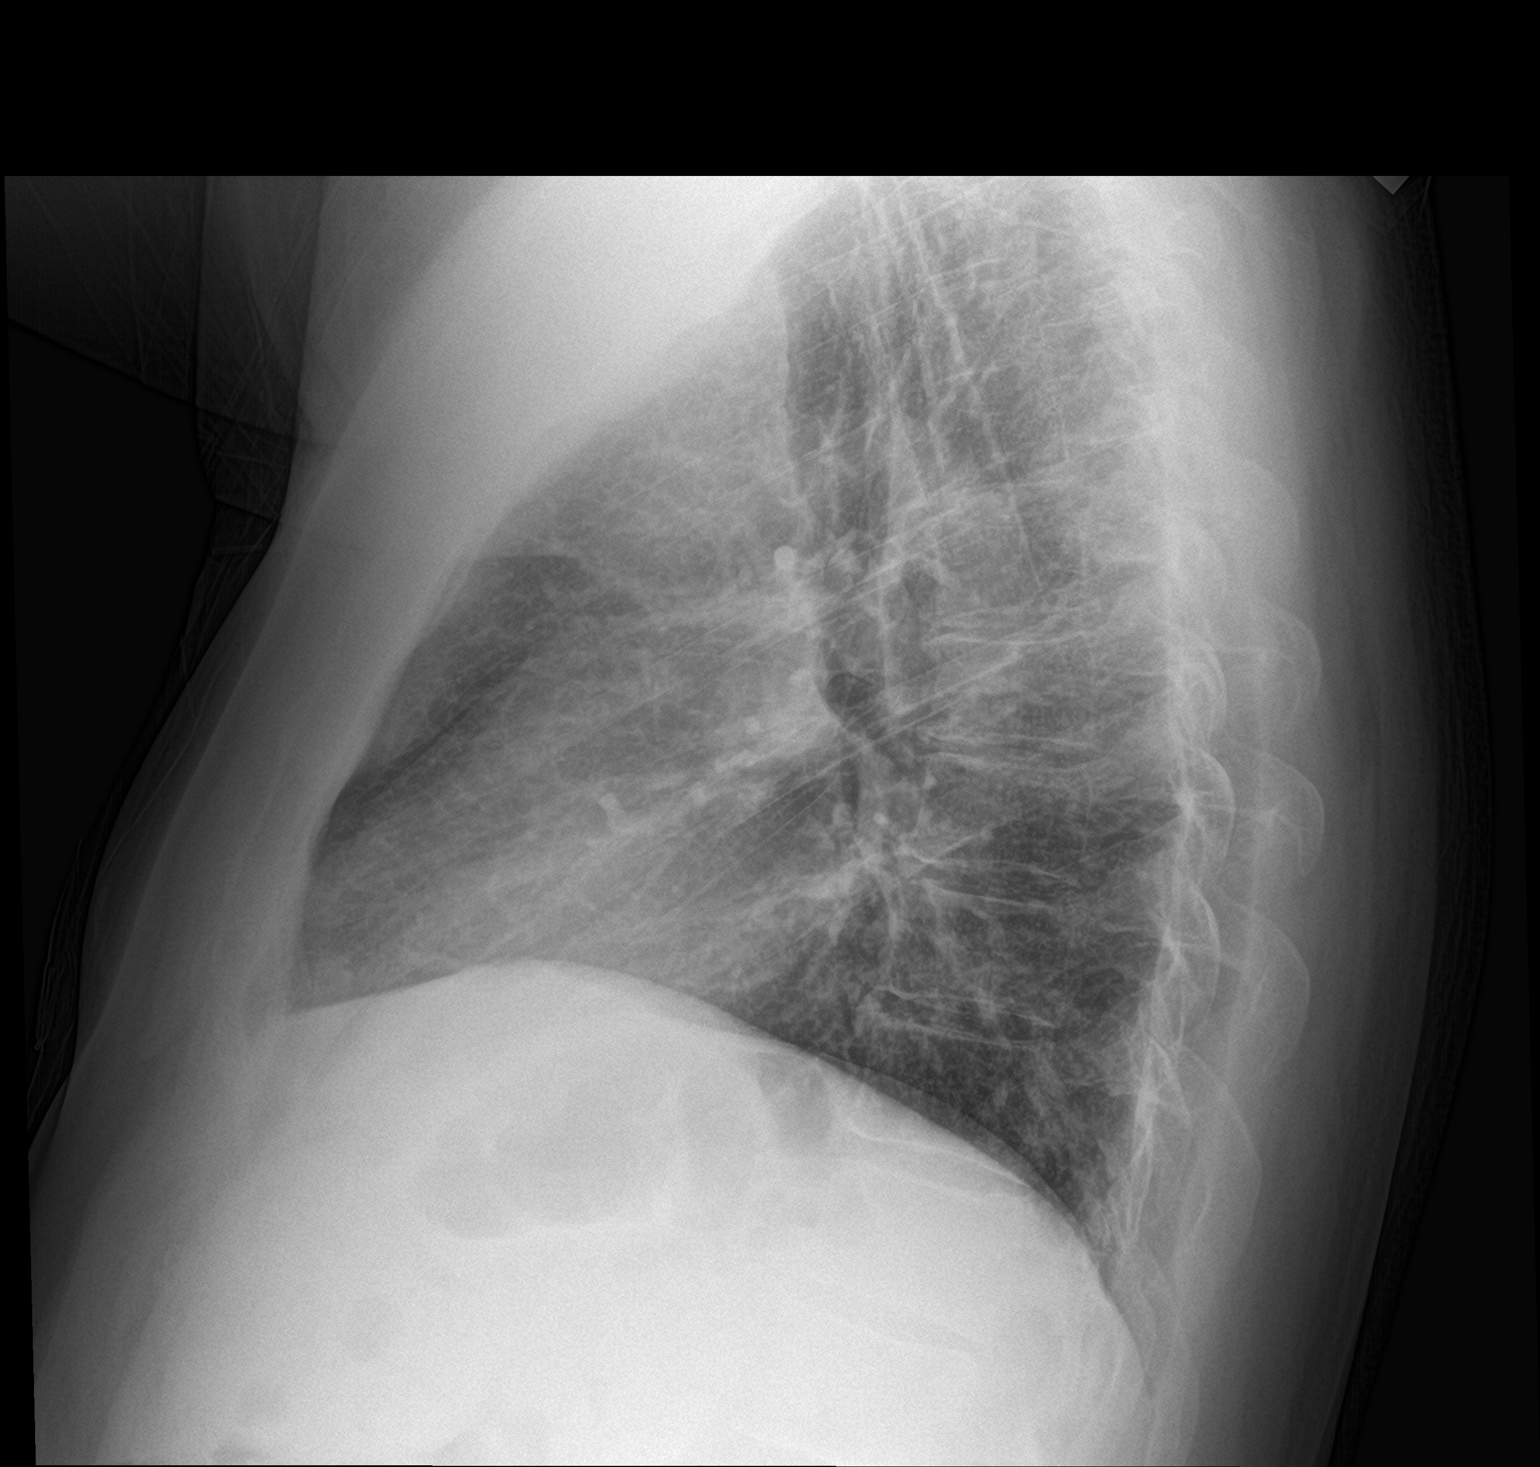

[2 of 2 positions shown; findings below may reference images not displayed]

FINDINGS: The heart size and mediastinal contours are within normal limits.
Both lungs are clear. The visualized skeletal structures are
unremarkable.
IMPRESSION: No active cardiopulmonary disease.

## 2018-03-31 ENCOUNTER — Encounter: Payer: Self-pay | Admitting: Internal Medicine

## 2018-03-31 ENCOUNTER — Ambulatory Visit: Payer: Federal, State, Local not specified - PPO | Admitting: Internal Medicine

## 2018-03-31 VITALS — BP 140/90 | HR 75 | Ht 72.0 in | Wt 247.0 lb

## 2018-03-31 DIAGNOSIS — Z9989 Dependence on other enabling machines and devices: Secondary | ICD-10-CM | POA: Diagnosis not present

## 2018-03-31 DIAGNOSIS — G4733 Obstructive sleep apnea (adult) (pediatric): Secondary | ICD-10-CM

## 2018-03-31 DIAGNOSIS — I4891 Unspecified atrial fibrillation: Secondary | ICD-10-CM

## 2018-03-31 NOTE — Progress Notes (Signed)
OFFICE NOTE  Chief Complaint:  Recurrent A. Fib  Primary Care Physician: Rodolph Bong, MD  HPI:  William Morton is a 62 yo male [employer] employee with no cardiac history. He has OSA, but has not been compliant recently with CPAP. He says he has had fatigue and non-restorative sleep for a while. He was recently admitted to [facility] for worsening fatigue and palpitations. He was noted to be in a-fib with RVR. He denies chest pain, but was lightheaded and mildly diaphoretic. He reports improvement with cardizem and spontaneously converted to sinus rhythm overnight. CHADSVASC Score is 0. Discussed options with him and will start Eliquis 5 mg BID. I recommended resuming use of his CPAP machine and that he would need another sleep study for titration. After discharge she underwent an echocardiogram which showed normal LV function and mild left atrial enlargement. He reports no recurrent symptoms of palpitations or A. fib. He has felt a little fatigued, but no shortness of breath or heart racing. He was started on low-dose Toprol. He is under a lot of stress in his care for his mother who is sick and this may be playing a role in it. As low normal today at 102/74.  08/22/2015  William Morton returns today for follow-up. He reports only one episode of palpitations for which he took a half tablet of Toprol with some improvement. He's not had any other episodes. He reports good energy level and is been compliant with CPAP. He has an upcoming sleep study on June 9 20 may have additional changes to his equipment. He takes Lipitor for dyslipidemia and had well-controlled lipid profile recently. He is hypothyroid on levothyroxine which is followed by his primary care provider.  07/16/2016  William Morton returns for follow-up. Over the last year he's had very few episodes of A. fib. He had some episode where he felt like his heart rate was slow with hard heart beats which could indicate PVCs. He reports pretty good  compliance with CPAP. He is concerned about whether his thyroid is well-regulated however in December 2017, his TSH was normal 2.13. Cholesterol that time was excellent as well with total cholesterol 113, triglycerides 84, HDL-C 31 and LDL-C 65 on atorvastatin 20.  01/02/2017  William Morton returns today for evaluation of fast heart rate. Yesterday he had noted faster heart rate throughout the evening and early morning. He took a metoprolol but his symptoms did not improve. Heart rate went up over 120 was bouncing around and he was quite symptomatic. He felt that this was his atrial fibrillation again. He took additional 12.5 mg metoprolol this morning and came in for early afternoon appointment. An EKG in the office today however shows he is back in sinus rhythm at 72. Blood pressures at 102/76. He currently feels a little fatigued however better. We again discussed the natural history of his A. fib. This is the first recurrence she's had in well over a year. He is using a metoprolol as needed. He has a low blood pressure and heart rate at rest therefore we've advocated this approach. His CHADSVASC score is 0. This episode of A. fib was less than 24 hours therefore there was no significant increased risk of stroke. I told him that we would likely see recurrent episodes going forward. Options may include daily beta blocker, or considering a pocket pill strategy.  03/31/2018  William Morton is seen today back in follow-up.  He reports he is been having some more intermittent  atrial fibrillation which occurs more rapidly and is not improved quickly with as needed metoprolol.  Is been under more stress including his mother who he cares for this 60 years old and went on hospice as of today.  He is concerned that his A. fib episodes are happening more regularly and that he is more symptomatic with the episodes.  PMHx:  Past Medical History:  Diagnosis Date  . Atrial fibrillation (HCC)   . Hyperlipidemia   . OSA  (obstructive sleep apnea)   . Thyroid disease   . Vitamin D deficiency     Past Surgical History:  Procedure Laterality Date  . CPAP TITRATION  09/22/2015  . EYE SURGERY    . TONSILLECTOMY      FAMHx:  Family History  Problem Relation Age of Onset  . Heart disease Mother   . Heart attack Mother   . Cancer Brother        throat CA  . Arrhythmia Father   . Cancer Father     SOCHx:   reports that he has never smoked. He has never used smokeless tobacco. He reports current alcohol use. He reports that he does not use drugs.  ALLERGIES:  Allergies  Allergen Reactions  . Penicillins Swelling and Rash    Swelling in joints    ROS: Pertinent items noted in HPI and remainder of comprehensive ROS otherwise negative.  HOME MEDS: Current Outpatient Medications  Medication Sig Dispense Refill  . atorvastatin (LIPITOR) 20 MG tablet TAKE 1 TABLET EVERY DAY 30 tablet 0  . cetirizine (ZYRTEC) 10 MG tablet Take 10 mg by mouth daily.    . Cholecalciferol (VITAMIN D PO) Take 300 Units by mouth.    . fluticasone (FLONASE) 50 MCG/ACT nasal spray Place 1 spray into both nostrils daily as needed for allergies.     Marland Kitchen. ibuprofen (ADVIL,MOTRIN) 200 MG tablet Take 400 mg by mouth every 6 (six) hours as needed for headache.    . levothyroxine (SYNTHROID, LEVOTHROID) 100 MCG tablet TAKE 1 TABLET BY MOUTH EVERY DAY 90 tablet 1  . metoprolol succinate (TOPROL-XL) 25 MG 24 hr tablet Take 0.5 tablets (12.5 mg total) by mouth daily as needed. 30 tablet 1  . mupirocin nasal ointment (BACTROBAN) 2 % Apply to sore 2-3 times daily for 5 days. 1 g 0   No current facility-administered medications for this visit.     LABS/IMAGING: No results found for this or any previous visit (from the past 48 hour(s)). No results found.  WEIGHTS: Wt Readings from Last 3 Encounters:  03/31/18 247 lb (112 kg)  03/17/18 243 lb (110.2 kg)  03/08/18 230 lb (104.3 kg)    VITALS: BP 140/90   Pulse 75   Ht 6' (1.829  m)   Wt 247 lb (112 kg)   BMI 33.50 kg/m   EXAM: General appearance: alert and no distress Neck: no carotid bruit and no JVD Lungs: clear to auscultation bilaterally Heart: regular rate and rhythm, S1, S2 normal, no murmur, click, rub or gallop Abdomen: soft, non-tender; bowel sounds normal; no masses,  no organomegaly Extremities: extremities normal, atraumatic, no cyanosis or edema Pulses: 2+ and symmetric Skin: Skin color, texture, turgor normal. No rashes or lesions Neurologic: Grossly normal Psych: Pleasant  EKG: Sinus rhythm 75, nonspecific ST changes-personally reviewed  ASSESSMENT: 1. Paroxysmal atrial fibrillation, CHADSVASC score 0-1 2. Elevated BP without the diagnosis of hypertension 3. OSA and CPAP 4. Dyslipidemia 5. Hypothyroidism  PLAN: 1.   William Morton  has had recurrent afib and his PRN metoprolol has not been helpful. His episodes are several times a month. He is a good candidate for an antiarrythmic strategy. He had an echo in 2017 which showed normal LVEF with no WM abnormality and no structural heart disease. Would recommend a baseline GXT. If negative, then start flecainide 50 mg BID. Check 12 lead EKG 1 week after starting medication and follow-up with me in 1-2 months.  Chrystie Nose, MD, [facility], FACP    [facility]  Medical Director of the [facility] Lipid Disorders [facility]  Cardiovascular Risk Reduction Clinic Diplomate of the American Board of Clinical Lipidology Attending Cardiologist  Direct Dial: [phone]  Fax: [phone]  Website:  [email or web address] 03/31/2018, 3:43 PM

## 2018-03-31 NOTE — Patient Instructions (Addendum)
Medication Instructions:  Continue current medications  If you need a refill on your cardiac medications before your next appointment, please call your pharmacy.   Testing/Procedures: Your physician has requested that you have an exercise tolerance test. This is done at Dr. Blanchie DessertHilty's office. Pending the test results, he may start you on FLECAINIDE twice daily.  Follow-Up: At [facility], you and your health needs are our priority.  As part of our continuing mission to provide you with exceptional heart care, we have created designated Provider Care Teams.  These Care Teams include your primary Cardiologist (physician) and Advanced Practice Providers (APPs -  Physician Assistants and Nurse Practitioners) who all work together to provide you with the care you need, when you need it. You will need a follow up appointment in 3 months. You may see Dr. Rennis GoldenHilty or one of the following Advanced Practice Providers on your designated Care Team: Azalee CourseHao Meng, [place]PA-C . Micah FlesherAngela Duke, PA-C  Any Other Special Instructions Will Be Listed Below (If Applicable).

## 2018-04-03 ENCOUNTER — Telehealth (HOSPITAL_COMMUNITY): Payer: Self-pay

## 2018-04-03 NOTE — Telephone Encounter (Signed)
Encounter complete. 

## 2018-04-08 ENCOUNTER — Other Ambulatory Visit: Payer: Self-pay | Admitting: Family Medicine

## 2018-04-09 ENCOUNTER — Ambulatory Visit (HOSPITAL_COMMUNITY)
Admission: RE | Admit: 2018-04-09 | Discharge: 2018-04-09 | Disposition: A | Discharge status: Home / Self Care | Payer: Federal, State, Local not specified - PPO | Source: Ambulatory Visit | Attending: Cardiovascular Disease | Admitting: Cardiovascular Disease

## 2018-04-09 ENCOUNTER — Other Ambulatory Visit: Payer: Self-pay | Admitting: *Deleted

## 2018-04-09 ENCOUNTER — Encounter (HOSPITAL_COMMUNITY): Payer: Self-pay | Admitting: *Deleted

## 2018-04-09 DIAGNOSIS — I4891 Unspecified atrial fibrillation: Secondary | ICD-10-CM

## 2018-04-09 LAB — EXERCISE TOLERANCE TEST
CHL CUP RESTING HR STRESS: 111 {beats}/min
CHL RATE OF PERCEIVED EXERTION: 19
CSEPED: 4 min
CSEPEW: 6.4 METS
CSEPPHR: 203 {beats}/min
Exercise duration (sec): 31 s
MPHR: 160 {beats}/min
Percent HR: 126 %

## 2018-04-09 MED ORDER — METOPROLOL SUCCINATE ER 25 MG PO TB24: 25.0000 mg | ORAL_TABLET | Freq: Every day | ORAL | 1 refills | Status: DC

## 2018-04-09 NOTE — Progress Notes (Unsigned)
Abnormal EKG due to AFIB. EKG was taken to Dr. Duke Salviaandolph. Permission was given to proceed with test. Abnormal ETT was reviewed by Dr. Duke Salviaandolph. Patient was told to take his metoprolol as soon as possible. A new prescription was ordered by Dr. Duke Salviaandolph. She also advised patient to have a follow-up appointment. Patient is scheduled to see Theodore DemarkRhonda Barrett.

## 2018-04-14 ENCOUNTER — Encounter: Payer: Self-pay | Admitting: Physician Assistant

## 2018-04-14 ENCOUNTER — Ambulatory Visit (INDEPENDENT_AMBULATORY_CARE_PROVIDER_SITE_OTHER): Payer: Federal, State, Local not specified - PPO | Admitting: Physician Assistant

## 2018-04-14 VITALS — BP 130/78 | HR 80 | Ht 72.0 in | Wt 244.8 lb

## 2018-04-14 DIAGNOSIS — E785 Hyperlipidemia, unspecified: Secondary | ICD-10-CM | POA: Diagnosis not present

## 2018-04-14 DIAGNOSIS — E039 Hypothyroidism, unspecified: Secondary | ICD-10-CM

## 2018-04-14 DIAGNOSIS — I480 Paroxysmal atrial fibrillation: Secondary | ICD-10-CM

## 2018-04-14 MED ORDER — METOPROLOL SUCCINATE ER 50 MG PO TB24: 50.0000 mg | ORAL_TABLET | Freq: Every day | ORAL | 3 refills | Status: DC

## 2018-04-14 NOTE — Patient Instructions (Signed)
Medication Instructions:  Your physician has recommended you make the following change in your medication:   INCREASE Metoprolol to 50mg  daily. An Rx has been sent to your pharmacy  If you need a refill on your cardiac medications before your next appointment, please call your pharmacy.   Lab work: None ordered If you have labs (blood work) drawn today and your tests are completely normal, you will receive your results only by: Marland Kitchen. MyChart Message (if you have MyChart) OR . A paper copy in the mail If you have any lab test that is abnormal or we need to change your treatment, we will call you to review the results.  Testing/Procedures: Your physician has requested that you have an echocardiogram. Echocardiography is a painless test that uses sound waves to create images of your heart. It provides your doctor with information about the size and shape of your heart and how well your heart's chambers and valves are working. This procedure takes approximately one hour. There are no restrictions for this procedure.    Follow-Up: At [facility], you and your health needs are our priority.  As part of our continuing mission to provide you with exceptional heart care, we have created designated Provider Care Teams.  These Care Teams include your primary Cardiologist (physician) and Advanced Practice Providers (APPs -  Physician Assistants and Nurse Practitioners) who all work together to provide you with the care you need, when you need it. You will need a follow up appointment in 2 months.  You may see Chrystie NoseKenneth C Hilty, MD or one of the following Advanced Practice Providers on your designated Care Team: [name], PA-C   Any Other Special Instructions Will Be Listed Below (If Applicable). Ok to exercise. Your heartrate goal is <120bpm

## 2018-04-14 NOTE — Progress Notes (Signed)
Cardiology Office Note   Date:  04/14/2018   ID:  William Morton, DOB [birth year], MRN [id number]  PCP:  Rodolph Bongorey, Evan S, MD Cardiologist:  Chrystie NoseKenneth C Hilty, MD 03/31/2018 William Morton Barrett, PA-C   No chief complaint on file.   History of Present Illness: William Morton is a 62 y.o. male with a history of Afib dx 2017, HLD, OSA, normal echo 2017  12/17 office visit, patient in atrial fibrillation and episodes happening more frequently, plan was for treadmill test and start flecainide if this was normal.  Treadmill test 12/26 not complete due to patient being in rapid atrial fibrillation, appointment made  William Morton presents for cardiology follow-up  He went back into SR sometime the day after the stress test.   He has no side effects from the Toprol XL He feels the med is helping to prevent the Afib. Has had no sx since starting it.   He has no problems w/ DOE except during the Afib. 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   No chest pain.   No LE edema, no orthopnea or PND. Uses CPAP qhs.   No presyncope or syncope.   His mother is at home w/ Hospice care, she is doing ok. His stress level is more manageable now.    Past Medical History:  Diagnosis Date  . Atrial fibrillation (HCC) 2017  . Hyperlipidemia   . OSA (obstructive sleep apnea)   . Thyroid disease   . Vitamin D deficiency     Past Surgical History:  Procedure Laterality Date  . CPAP TITRATION  09/22/2015  . EYE SURGERY    . TONSILLECTOMY      Current Outpatient Medications  Medication Sig Dispense Refill  . atorvastatin (LIPITOR) 20 MG tablet TAKE 1 TABLET EVERY DAY 30 tablet 0  . cetirizine (ZYRTEC) 10 MG tablet Take 10 mg by mouth daily.    . Cholecalciferol (VITAMIN D PO) Take 300 Units by mouth.    . fluticasone (FLONASE) 50 MCG/ACT nasal spray Place 1 spray into both nostrils daily as needed for allergies.     Marland Kitchen. ibuprofen (ADVIL,MOTRIN) 200 MG tablet Take 400 mg by mouth every 6  (six) hours as needed for headache.    . levothyroxine (SYNTHROID, LEVOTHROID) 100 MCG tablet TAKE 1 TABLET BY MOUTH EVERY DAY 90 tablet 1  . metoprolol succinate (TOPROL-XL) 25 MG 24 hr tablet Take 1 tablet (25 mg total) by mouth daily. 30 tablet 1  . mupirocin nasal ointment (BACTROBAN) 2 % Apply to sore 2-3 times daily for 5 days. 1 g 0   No current facility-administered medications for this visit.     Allergies:   Penicillins    Social History:  The patient  reports that he has never smoked. He has never used smokeless tobacco. He reports current alcohol use. He reports that he does not use drugs.   Family History:  The patient's family history includes Arrhythmia in his father; Cancer in his brother and father; Heart attack in his mother; Heart disease in his mother.  He indicated that the status of his mother is unknown. He indicated that the status of his father is unknown. He indicated that the status of his brother is unknown.   ROS:  Please see the history of present illness. All other systems are reviewed and negative.    PHYSICAL EXAM: VS:  BP 130/78   Pulse 80   Ht 6' (1.829 m)   Wt 244 lb  12.8 oz (111 kg)   BMI 33.20 kg/m  , BMI Body mass index is 33.2 kg/m. GEN: Well nourished, well developed, male in no acute distress HEENT: normal for age  Neck: no JVD, no carotid bruit, no masses Cardiac: RRR; soft murmur, no rubs, or gallops Respiratory:  clear to auscultation bilaterally, normal work of breathing GI: soft, nontender, nondistended, + BS MS: no deformity or atrophy; no edema; distal pulses are 2+ in all 4 extremities  Skin: warm and dry, no rash Neuro:  Strength and sensation are intact Psych: euthymic mood, full affect   EKG:  EKG is ordered today. The ekg ordered today demonstrates sinus rhythm, heart rate 80, no acute ischemic changes.  ECHO: 05/17/2015 - Left ventricle: The cavity size was normal. Systolic function was   normal. The estimated ejection  fraction was in the range of 60%   to 65%. Wall motion was normal; there were no regional wall   motion abnormalities. - Left atrium: The atrium was mildly dilated. - Atrial septum: No defect or patent foramen ovale was identified.  ETT: 04/09/2018 Non-diagnostic for ischemia due to baseline ST changes Rhythm rapid afib 2 mm ST depression with stress  Poor resting HR control 111 bpm No QRS widening with stress   Recent Labs: No results found for requested labs within last 8760 hours.  CBC    Component Value Date/Time   WBC 7.2 03/19/2017 1420   RBC 5.01 03/19/2017 1420   HGB 15.8 03/19/2017 1420   HCT 45.5 03/19/2017 1420   PLT 225 03/19/2017 1420   MCV 90.8 03/19/2017 1420   MCH 31.5 03/19/2017 1420   MCHC 34.7 03/19/2017 1420   RDW 12.4 03/19/2017 1420   CMP Latest Ref Rng & Units 03/19/2017 03/18/2016 08/01/2015  Glucose 65 - 99 mg/dL 93 96 90  BUN 7 - 25 mg/dL 15 15 16   Creatinine 0.70 - 1.33 mg/dL 8.291.07 5.621.08 1.301.03  Sodium 135 - 146 mmol/L 141 139 139  Potassium 3.5 - 5.3 mmol/L 4.2 4.2 4.1  Chloride 98 - 110 mmol/L 104 104 104  CO2 20 - 32 mmol/L 30 28 26   Calcium 8.6 - 10.3 mg/dL 9.8 9.2 9.2  Total Protein 6.1 - 8.1 g/dL 7.1 6.9 6.6  Total Bilirubin 0.2 - 1.2 mg/dL 0.9 0.6 0.8  Alkaline Phos 40 - 115 U/L - 73 68  AST 10 - 35 U/L 29 23 23   ALT 9 - 46 U/L 35 24 21     Lipid Panel Lab Results  Component Value Date   CHOL 122 03/19/2017   HDL 34 (L) 03/19/2017   LDLCALC 70 03/19/2017   TRIG 98 03/19/2017   CHOLHDL 3.6 03/19/2017      Wt Readings from Last 3 Encounters:  04/14/18 244 lb 12.8 oz (111 kg)  03/31/18 247 lb (112 kg)  03/17/18 243 lb (110.2 kg)     Other studies Reviewed: Additional studies/ records that were reviewed today include: office notes, hospital records and testing.  ASSESSMENT AND PLAN:  1.  Paroxysmal atrial fib: He spontaneously converted to sinus rhythm. -When he was in atrial fibrillation, his heart rate was not well  controlled. -Increase metoprolol to hopefully decrease the frequency and intensity of his A. fib episodes. -I discussed getting a nuclear stress test before going on flecainide.  I explained that I would prefer he not have to get on a treadmill again because he would have to hold his beta-blocker.  Therefore, we would do a Lexiscan. -  He feels he got a significant benefit from the metoprolol, even at a low dose. -He prefers to go with the increased metoprolol dose for now. -If that does not control his symptoms, he understands that he will need to call us to schedule a stress test and make a follow-up appointment with Dr. Rennis Golden to go on flecainide. -Go ahead and check an echocardiogram, his EF is almost certainly normal, but make sure his atria are not dilated because that might change treatment.  2.  Anticoagulation: CHA2DS2-VASc = 0 At this time, he is not anticoagulated.  3.  Hyperlipidemia: - He is on Lipitor 20 mg daily - Lipids are followed by his PCP and labs are ordered. - Contact the patient to make sure he gets those done.  4.  Hypothyroid: Continue TSH per PCP.  TSH recheck is ordered    Current medicines are reviewed at length with the patient today.  The patient has concerns regarding medicines.  Concerns were addressed  The following changes have been made: Increase metoprolol  Labs/ tests ordered today include:   Orders Placed This Encounter  Procedures  . EKG 12-Lead  . ECHOCARDIOGRAM COMPLETE     Disposition:   FU with Chrystie Nose, MD  Signed, William Demark, PA-C  04/14/2018 4:41 PM    [facility] Phone: [phone]; Fax: [phone]

## 2018-04-22 ENCOUNTER — Ambulatory Visit (HOSPITAL_COMMUNITY): Payer: Federal, State, Local not specified - PPO | Attending: Cardiology

## 2018-04-22 ENCOUNTER — Other Ambulatory Visit: Payer: Self-pay

## 2018-04-22 DIAGNOSIS — I480 Paroxysmal atrial fibrillation: Secondary | ICD-10-CM | POA: Diagnosis not present

## 2018-05-01 ENCOUNTER — Other Ambulatory Visit: Payer: Self-pay | Admitting: Cardiovascular Disease

## 2018-05-05 ENCOUNTER — Telehealth: Payer: Self-pay

## 2018-05-05 NOTE — Telephone Encounter (Signed)
Patient thanked me for my call and stated he had forgot all about his labs but he will do it.

## 2018-05-05 NOTE — Telephone Encounter (Signed)
-----   Message from Darrol Jump, PA-C sent at 04/30/2018  7:23 PM EST ----- Regarding: Labs He has labs in the system including a lipid profile and some others.  Can you call him to make sure he is going to come in to get those drawn?  Does not look like they were ordered by [place], but he still needs them Thanks ----- Message ----- From: Jacinta Shoe Sent: 04/14/2018   5:03 PM EST To: Darrol Jump, PA-C  Make sure he gets his labs drawn

## 2018-05-06 DIAGNOSIS — E782 Mixed hyperlipidemia: Secondary | ICD-10-CM | POA: Diagnosis not present

## 2018-05-06 DIAGNOSIS — E039 Hypothyroidism, unspecified: Secondary | ICD-10-CM | POA: Diagnosis not present

## 2018-05-06 DIAGNOSIS — I4891 Unspecified atrial fibrillation: Secondary | ICD-10-CM | POA: Diagnosis not present

## 2018-05-07 LAB — COMPLETE METABOLIC PANEL WITH GFR
AG Ratio: 1.5 (calc) (ref 1.0–2.5)
ALKALINE PHOSPHATASE (APISO): 53 U/L (ref 40–115)
ALT: 27 U/L (ref 9–46)
AST: 27 U/L (ref 10–35)
Albumin: 4.2 g/dL (ref 3.6–5.1)
BILIRUBIN TOTAL: 1 mg/dL (ref 0.2–1.2)
BUN: 16 mg/dL (ref 7–25)
CO2: 25 mmol/L (ref 20–32)
CREATININE: 1.08 mg/dL (ref 0.70–1.25)
Calcium: 9.4 mg/dL (ref 8.6–10.3)
Chloride: 106 mmol/L (ref 98–110)
GFR, Est African American: 86 mL/min/{1.73_m2} (ref >=60)
GFR, Est Non African American: 74 mL/min/{1.73_m2} (ref >=60)
GLOBULIN: 2.8 g/dL (ref 1.9–3.7)
GLUCOSE: 91 mg/dL (ref 65–99)
POTASSIUM: 4.1 mmol/L (ref 3.5–5.3)
SODIUM: 140 mmol/L (ref 135–146)
Total Protein: 7 g/dL (ref 6.1–8.1)

## 2018-05-07 LAB — CBC
HCT: 44.3 % (ref 38.5–50.0)
HEMOGLOBIN: 15.4 g/dL (ref 13.2–17.1)
MCH: 31.7 pg (ref 27.0–33.0)
MCHC: 34.8 g/dL (ref 32.0–36.0)
MCV: 91.2 fL (ref 80.0–100.0)
MPV: 10.8 fL (ref 7.5–12.5)
PLATELETS: 200 10*3/uL (ref 140–400)
RBC: 4.86 10*6/uL (ref 4.20–5.80)
RDW: 12.4 % (ref 11.0–15.0)
WBC: 6.9 10*3/uL (ref 3.8–10.8)

## 2018-05-07 LAB — TSH: TSH: 1.45 m[IU]/L (ref 0.40–4.50)

## 2018-05-07 LAB — LIPID PANEL W/REFLEX DIRECT LDL
CHOL/HDL RATIO: 3.9 (calc) (ref <5.0)
CHOLESTEROL: 125 mg/dL (ref <200)
HDL: 32 mg/dL — AB (ref >40)
LDL Cholesterol (Calc): 73 mg/dL (calc)
Non-HDL Cholesterol (Calc): 93 mg/dL (calc) (ref <130)
Triglycerides: 116 mg/dL (ref <150)

## 2018-05-17 ENCOUNTER — Other Ambulatory Visit: Payer: Self-pay | Admitting: Family Medicine

## 2018-06-15 ENCOUNTER — Ambulatory Visit: Payer: Federal, State, Local not specified - PPO | Admitting: Internal Medicine

## 2018-07-01 ENCOUNTER — Ambulatory Visit: Payer: Federal, State, Local not specified - PPO | Admitting: Internal Medicine

## 2018-07-01 ENCOUNTER — Telehealth: Payer: Self-pay | Admitting: *Deleted

## 2018-07-01 NOTE — Telephone Encounter (Signed)
   Primary Cardiologist:  Chrystie Nose, MD   Patient contacted.  History reviewed.  No symptoms to suggest any unstable cardiac conditions.  Based on discussion, with current pandemic situation, we will be postponing this appointment for MR [place],If symptoms change, he has been instructed to contact our office.  Routing to C19 CANCEL pool for tracking (P CV DIV CV19 CANCEL).  Tobin Chad, RN  07/01/2018 3:13 PM   LATE ENTRY -  PATIENT VERBALIZED  NO ISSUES      .

## 2018-07-13 ENCOUNTER — Telehealth: Payer: Self-pay | Admitting: Internal Medicine

## 2018-07-13 NOTE — Telephone Encounter (Signed)
Will forward to dr hilty to review and advise

## 2018-07-13 NOTE — Telephone Encounter (Signed)
° °  Patient calling with concerns. He works for [employer] and wants to know if his heart condition puts him at higher risk of covid19. Employer requesting letter

## 2018-07-13 NOTE — Telephone Encounter (Signed)
Spoke with pt, aware of dr hilty's recommendations.  

## 2018-07-13 NOTE — Telephone Encounter (Signed)
His age is probably a bigger risk factor for COVID 19 than his afib, however, we are really all at risk. Also, since he [place] exposed [place] airport, risk is higher. He should practice social distancing, frequent handwashing and PPE (as regulated by the [employer]).  Dr. Rexene Edison

## 2018-07-15 ENCOUNTER — Telehealth: Payer: Self-pay | Admitting: Family Medicine

## 2018-07-15 NOTE — Telephone Encounter (Signed)
Pt called and asked that Dr. Denyse Amass would write him a letter stating that due to his age and heart condition that he is a high risk , His employer [employer] is requiring letter if they are high risk and should not be working.

## 2018-07-16 ENCOUNTER — Encounter: Payer: Self-pay | Admitting: Family Medicine

## 2018-07-16 NOTE — Telephone Encounter (Signed)
Letter written and sent through my chart.  Additionally a letter is available for pickup at front desk or can be mailed.

## 2018-07-16 NOTE — Telephone Encounter (Signed)
Pt advised, can get letter from [facility]

## 2018-10-13 ENCOUNTER — Telehealth: Payer: Self-pay | Admitting: Internal Medicine

## 2018-10-13 NOTE — Telephone Encounter (Signed)

## 2018-10-14 ENCOUNTER — Encounter: Payer: Self-pay | Admitting: Internal Medicine

## 2018-10-14 ENCOUNTER — Telehealth (INDEPENDENT_AMBULATORY_CARE_PROVIDER_SITE_OTHER): Payer: Federal, State, Local not specified - PPO | Admitting: Internal Medicine

## 2018-10-14 VITALS — Ht 72.0 in | Wt 230.0 lb

## 2018-10-14 DIAGNOSIS — G4733 Obstructive sleep apnea (adult) (pediatric): Secondary | ICD-10-CM

## 2018-10-14 DIAGNOSIS — E785 Hyperlipidemia, unspecified: Secondary | ICD-10-CM | POA: Diagnosis not present

## 2018-10-14 DIAGNOSIS — Z9989 Dependence on other enabling machines and devices: Secondary | ICD-10-CM | POA: Diagnosis not present

## 2018-10-14 DIAGNOSIS — I480 Paroxysmal atrial fibrillation: Secondary | ICD-10-CM | POA: Diagnosis not present

## 2018-10-14 NOTE — Patient Instructions (Signed)
Medication Instructions:  Continue current medications If you need a refill on your cardiac medications before your next appointment, please call your pharmacy.    Follow-Up: At [facility], you and your health needs are our priority.  As part of our continuing mission to provide you with exceptional heart care, we have created designated Provider Care Teams.  These Care Teams include your primary Cardiologist (physician) and Advanced Practice Providers (APPs -  Physician Assistants and Nurse Practitioners) who all work together to provide you with the care you need, when you need it. You will need a follow up appointment in 6 months.  Please call our office 2 months in advance to schedule this appointment.  You may see Pixie Casino, MD or one of the following Advanced Practice Providers on your designated Care Team: [place], [place] . Fabian Sharp, PA-C  Any Other Special Instructions Will Be Listed Below (If Applicable).  You have been referred to Dr. Allegra Lai ([address] - 3rd Floor) - cardiac electrophysiologist

## 2018-10-14 NOTE — Addendum Note (Signed)
Addended by: Fidel Levy on: 10/14/2018 04:17 PM   Modules accepted: Orders

## 2018-10-14 NOTE — Progress Notes (Signed)
Virtual Visit via Video Note   This visit type was conducted due to national recommendations for restrictions regarding the COVID-19 Pandemic (e.g. social distancing) in an effort to limit this patient's exposure and mitigate transmission in our community.  Due to his co-morbid illnesses, this patient is at least at moderate risk for complications without adequate follow up.  This format is felt to be most appropriate for this patient at this time.  All issues noted in this document were discussed and addressed.  A limited physical exam was performed with this format.  Please refer to the patient's chart for his consent to telehealth for [facility].   Evaluation Performed:  Doxy.me video visit  Date:  10/14/2018   ID:  William Morton, DOB 12-Nov-1955, MRN [id number]  Patient Location:  [place] [address]  Provider location:   [address] [place], [place] [address]  PCP:  Gregor Hams, MD  Cardiologist:  Pixie Casino, MD Electrophysiologist:  None   Chief Complaint:  PAF  History of Present Illness:    William Morton is a 63 y.o. male who presents via audio/video conferencing for a telehealth visit today.  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 fib.  He has had this paroxysmally and at times goes very fast.  He is symptomatic with his A. fib.  His last episode was in December.  He saw Rosaria Ferries, PA-C who had recommended stress testing before going on flecainide.  Initially this was ordered as a nuclear stress test however a treadmill stress test was performed.  He had marked tachycardia and it was difficult to determine if there was any evidence for ischemia.  I had recommended nuclear scanning, however his insurance may not of approve that.  Subsequently he also had an echo which showed normal systolic function and mild biatrial enlargement.  His dose of Toprol-XL  was increased to 50 mg daily however he said he felt unwell and fatigued on that and therefore he decreased it back to 25 mg but instead of taking it as needed he started taking it daily.  Since then he is done fairly well with really only one other episode of A. fib however again he was quite symptomatic with it but it did not last as long.  He is concerned about the frequency of his episodes and the fact that they have been recurring more recently.  Discussed options with him including antiarrhythmic therapy or possibly referral for catheter ablation.  He wants to explore all of his options and is getting frustrated with his A. fib recurrence.  The patient does not have symptoms concerning for COVID-19 infection (fever, chills, cough, or new SHORTNESS OF BREATH).    Prior CV studies:   The following studies were reviewed today:  Chart reviewed Lab work  PMHx:  Past Medical History:  Diagnosis Date   Atrial fibrillation ([place]) 2017   Hyperlipidemia    OSA (obstructive sleep apnea)    Thyroid disease    Vitamin D deficiency     Past Surgical History:  Procedure Laterality Date   CPAP TITRATION  09/22/2015   EYE SURGERY     TONSILLECTOMY      FAMHx:  Family History  Problem Relation Age of Onset   Heart disease Mother    Heart attack Mother    Cancer Brother  throat CA   Arrhythmia Father    Cancer Father     SOCHx:   reports that he has never smoked. He has never used smokeless tobacco. He reports current alcohol use. He reports that he does not use drugs.  ALLERGIES:  Allergies  Allergen Reactions   Penicillins Swelling and Rash    Swelling in joints    MEDS:  Current Meds  Medication Sig   atorvastatin (LIPITOR) 20 MG tablet TAKE 1 TABLET EVERY DAY   cetirizine (ZYRTEC) 10 MG tablet Take 10 mg by mouth daily.   Cholecalciferol (VITAMIN D PO) Take 300 Units by mouth.   fluticasone (FLONASE) 50 MCG/ACT nasal spray Place 1 spray into both  nostrils daily as needed for allergies.    ibuprofen (ADVIL,MOTRIN) 200 MG tablet Take 400 mg by mouth every 6 (six) hours as needed for headache.   levothyroxine (SYNTHROID, LEVOTHROID) 100 MCG tablet TAKE 1 TABLET BY MOUTH EVERY DAY   metoprolol succinate (TOPROL-XL) 25 MG 24 hr tablet TAKE 1 TABLET BY MOUTH EVERY DAY   metoprolol succinate (TOPROL-XL) 50 MG 24 hr tablet Take 1 tablet (50 mg total) by mouth daily.     ROS: Pertinent items noted in HPI and remainder of comprehensive ROS otherwise negative.  Labs/Other Tests and Data Reviewed:    Recent Labs: 05/06/2018: ALT 27; BUN 16; Creat 1.08; Hemoglobin 15.4; Platelets 200; Potassium 4.1; Sodium 140; TSH 1.45   Recent Lipid Panel Lab Results  Component Value Date/Time   CHOL 125 05/06/2018 01:37 PM   TRIG 116 05/06/2018 01:37 PM   HDL 32 (L) 05/06/2018 01:37 PM   CHOLHDL 3.9 05/06/2018 01:37 PM   LDLCALC 73 05/06/2018 01:37 PM    Wt Readings from Last 3 Encounters:  10/14/18 230 lb (104.3 kg)  04/14/18 244 lb 12.8 oz (111 kg)  03/31/18 247 lb (112 kg)     Exam:    Vital Signs:  Ht 6' (1.829 m)    Wt 230 lb (104.3 kg)    BMI 31.19 kg/m    General appearance: alert and no distress Lungs: No audible respiratory difficulty Abdomen: Mildly overweight Extremities: extremities normal, atraumatic, no cyanosis or edema Skin: Skin color, texture, turgor normal. No rashes or lesions Neurologic: Mental status: Alert, oriented, thought content appropriate Psych: Pleasant  ASSESSMENT & PLAN:    1. Paroxysmal atrial fibrillation, CHADSVASC score 0 2. Elevated BP without the diagnosis of hypertension 3. OSA and CPAP 4. Dyslipidemia 5. Hypothyroidism  Mr. [name] continues to have recurrent A. fib although really only a few episodes over the last year, he is highly symptomatic with it and notably goes into a rapid A. fib.  This is alternating with bradycardia at times.  Although we have proposed antiarrhythmic therapy,  ultimately he went on to a higher dose beta-blocker but felt unwell with that and his decrease the dose.  He has had breakthroughs since then however nonsustained.  We discussed options including antiarrhythmic drug therapy or possible referral for selective ablation.  As he has paroxysmal A. fib with mildly abnormal atrial size, he may be a good candidate for early ablation.  I will plan to refer him to EP for further evaluation as to antiarrhythmic drug therapy versus catheter ablation options.  COVID-19 Education: The signs and symptoms of COVID-19 were discussed with the patient and how to seek care for testing (follow up with PCP or arrange E-visit).  The importance of social distancing was discussed today.  Patient Risk:  After full review of this patients clinical status, I feel that they are at least moderate risk at this time.  Time:   Today, I have spent 25 minutes with the patient with telehealth technology discussing atrial fibrillation, OSA on CPAP, dyslipidemia.     Medication Adjustments/Labs and Tests Ordered: Current medicines are reviewed at length with the patient today.  Concerns regarding medicines are outlined above.   Tests Ordered: Orders Placed This Encounter  Procedures   Ambulatory referral to Cardiac Electrophysiology    Medication Changes: No orders of the defined types were placed in this encounter.   Disposition:  in 6 month(s)  Chrystie NoseKenneth C. Carmeline Kowal, MD, [facility]ACC, FACP  [place]   [facility]  Medical Director of the Advanced Lipid Disorders &  Cardiovascular Risk Reduction Clinic Diplomate of the American Board of Clinical Lipidology Attending Cardiologist  Direct Dial: [phone]   Fax: [phone]  Website:  [email or web address]  Chrystie NoseKenneth C Kule Gascoigne, MD  10/14/2018 3:58 PM

## 2018-10-26 ENCOUNTER — Telehealth (INDEPENDENT_AMBULATORY_CARE_PROVIDER_SITE_OTHER): Payer: Federal, State, Local not specified - PPO | Admitting: Cardiology

## 2018-10-26 DIAGNOSIS — I480 Paroxysmal atrial fibrillation: Secondary | ICD-10-CM

## 2018-10-26 MED ORDER — FLECAINIDE ACETATE 50 MG PO TABS: 50.0000 mg | ORAL_TABLET | Freq: Two times a day (BID) | ORAL | 4 refills | Status: DC

## 2018-10-26 NOTE — Progress Notes (Signed)
Virtual Visit via Telephone Note   This visit type was conducted due to national recommendations for restrictions regarding the COVID-19 Pandemic (e.g. social distancing) in an effort to limit this patient's exposure and mitigate transmission in our community.  Due to his co-morbid illnesses, this patient is at least at moderate risk for complications without adequate follow up.  This format is felt to be most appropriate for this patient at this time.  The patient did not have access to video technology/had technical difficulties with video requiring transitioning to audio format only (telephone).  All issues noted in this document were discussed and addressed.  No physical exam could be performed with this format.  Please refer to the patient's chart for his  consent to telehealth for [facility].   Date:  10/26/2018   ID:  William [place], DOB January 31, 1956, MRN [id number]  Patient Location: Home Provider Location: Home  PCP:  Rodolph Bongorey, Evan S, MD  Cardiologist:  Chrystie NoseKenneth C Hilty, MD  Electrophysiologist:  None   Evaluation Performed:  Consultation - William [place] was referred by [place]had Hilty for the evaluation of atrial fibrillation.  Chief Complaint: Atrial fibrillation  History of Present Illness:    William [place] is a 63 y.o. male with he has a history of paroxysmal atrial fibrillation, hyperlipidemia, and obstructive sleep apnea.  His most recent episode of atrial fibrillation was in December.  He is currently on Toprol-XL with the dose reduced due to fatigue.  His atrial fibrillation episodes have been occurring more frequently.  His symptoms of atrial fibrillation are weakness, fatigue, and shortness of breath.  His symptoms were mildly improved with the addition of metoprolol.  His atrial fibrillation occurs once every few months.  He says that he is unable to do any daily activities when it occurs and his episodes last between 3 to 5 hours.  The patient does not have symptoms  concerning for COVID-19 infection (fever, chills, cough, or new shortness of breath).    Past Medical History:  Diagnosis Date  . Atrial fibrillation (HCC) 2017  . Hyperlipidemia   . OSA (obstructive sleep apnea)   . Thyroid disease   . Vitamin D deficiency    Past Surgical History:  Procedure Laterality Date  . CPAP TITRATION  09/22/2015  . EYE SURGERY    . TONSILLECTOMY       Current Meds  Medication Sig  . atorvastatin (LIPITOR) 20 MG tablet TAKE 1 TABLET EVERY DAY  . cetirizine (ZYRTEC) 10 MG tablet Take 10 mg by mouth daily.  . Cholecalciferol (VITAMIN D PO) Take 300 Units by mouth.  . fluticasone (FLONASE) 50 MCG/ACT nasal spray Place 1 spray into both nostrils daily as needed for allergies.   Marland Kitchen. ibuprofen (ADVIL,MOTRIN) 200 MG tablet Take 400 mg by mouth every 6 (six) hours as needed for headache.  . levothyroxine (SYNTHROID, LEVOTHROID) 100 MCG tablet TAKE 1 TABLET BY MOUTH EVERY DAY  . metoprolol succinate (TOPROL-XL) 25 MG 24 hr tablet TAKE 1 TABLET BY MOUTH EVERY DAY     Allergies:   Penicillins   Social History   Tobacco Use  . Smoking status: Never Smoker  . Smokeless tobacco: Never Used  Substance Use Topics  . Alcohol use: Yes  . Drug use: No     Family Hx: The patient's family history includes Arrhythmia in his father; Cancer in his brother and father; Heart attack in his mother; Heart disease in his mother.  ROS:   Please see the history of  present illness.     All other systems reviewed and are negative.   Prior CV studies:   The following studies were reviewed today:  TTE 04/22/18 - Left ventricle: The cavity size was normal. Wall thickness was   increased in a pattern of mild LVH. Systolic function was normal.   The estimated ejection fraction was in the range of 60% to 65%.   Wall motion was normal; there were no regional wall motion   abnormalities. The study is indeterminate for the evaluation of   LV diastolic function. - Mitral valve:  There was trivial regurgitation. - Left atrium: The atrium was mildly dilated. - Right ventricle: The cavity size was mildly dilated. Wall   thickness was normal. - Right atrium: The atrium was mildly dilated. - Atrial septum: No defect or patent foramen ovale was identified. - Tricuspid valve: There was trivial regurgitation. - Pulmonary arteries: Systolic pressure was within the normal   range.  Labs/Other Tests and Data Reviewed:    EKG:  An ECG dated 04/14/18 was personally reviewed today and demonstrated:  SR, rate 80  Recent Labs: 05/06/2018: ALT 27; BUN 16; Creat 1.08; Hemoglobin 15.4; Platelets 200; Potassium 4.1; Sodium 140; TSH 1.45   Recent Lipid Panel Lab Results  Component Value Date/Time   CHOL 125 05/06/2018 01:37 PM   TRIG 116 05/06/2018 01:37 PM   HDL 32 (L) 05/06/2018 01:37 PM   CHOLHDL 3.9 05/06/2018 01:37 PM   LDLCALC 73 05/06/2018 01:37 PM    Wt Readings from Last 3 Encounters:  10/14/18 230 lb (104.3 kg)  04/14/18 244 lb 12.8 oz (111 kg)  03/31/18 247 lb (112 kg)     Objective:    Vital Signs:  Pulse 93    VITAL SIGNS:  reviewed GEN:  no acute distress NEURO:  alert and oriented x 3, no obvious focal deficit PSYCH:  normal affect  ASSESSMENT & PLAN:    1. Paroxysmal atrial fibrillation: Currently on Toprol-XL.  Not anticoagulated due to low stroke risk.  I discussed with him the possibilities of ablation versus medical management.  At this point, his mother's health is declining and he is her primary caregiver.  He would prefer to wait for ablation until she dies which he feels will be within the next few months.  In the interim, we will start him on flecainide 50 mg.  We will get an ECG in a week.  If he has further episodes of atrial fibrillation, will potentially plan to increase his flecainide dose and get an exercise treadmill test.  I have told him to call us back if he decides on ablation before his next follow-up. 2.  This patients  CHA2DS2-VASc Score and unadjusted Ischemic Stroke Rate (% per year) is equal to 0.2 % stroke rate/year from a score of 0  Above score calculated as 1 point each if present [CHF, HTN, DM, Vascular=MI/PAD/Aortic Plaque, Age if 65-74, or Male] Above score calculated as 2 points each if present [Age > 75, or Stroke/TIA/TE]   3. OSA: CPAP compliance encouraged  COVID-19 Education: The signs and symptoms of COVID-19 were discussed with the patient and how to seek care for testing (follow up with PCP or arrange E-visit).  The importance of social distancing was discussed today.  Time:   Today, I have spent 15 minutes with the patient with telehealth technology discussing the above problems.    Case discussed with referring cardiologist  Medication Adjustments/Labs and Tests Ordered: Current medicines are reviewed  at length with the patient today.  Concerns regarding medicines are outlined above.   Tests Ordered: No orders of the defined types were placed in this encounter.   Medication Changes: No orders of the defined types were placed in this encounter.   Follow Up:  In Person in 6 month(s)  Signed, Will Meredith Leeds, MD  10/26/2018 2:52 PM    [facility]

## 2018-10-26 NOTE — Addendum Note (Signed)
Addended by: Stanton Kidney on: 10/26/2018 03:14 PM   Modules accepted: Orders

## 2018-11-04 ENCOUNTER — Ambulatory Visit: Payer: Federal, State, Local not specified - PPO

## 2018-11-13 ENCOUNTER — Ambulatory Visit (INDEPENDENT_AMBULATORY_CARE_PROVIDER_SITE_OTHER): Payer: Federal, State, Local not specified - PPO | Admitting: *Deleted

## 2018-11-13 ENCOUNTER — Other Ambulatory Visit: Payer: Self-pay

## 2018-11-13 VITALS — HR 61 | Ht 72.0 in | Wt 244.0 lb

## 2018-11-13 DIAGNOSIS — I4891 Unspecified atrial fibrillation: Secondary | ICD-10-CM | POA: Diagnosis not present

## 2018-11-13 NOTE — Progress Notes (Signed)
Pt came in for EKG today after starting Flecainide EKG Reviewed by Dr Lovena Le ./cy

## 2018-11-27 ENCOUNTER — Other Ambulatory Visit: Payer: Self-pay | Admitting: Family Medicine

## 2018-12-24 ENCOUNTER — Other Ambulatory Visit: Payer: Self-pay | Admitting: Family Medicine

## 2018-12-25 ENCOUNTER — Other Ambulatory Visit: Payer: Self-pay | Admitting: Family Medicine

## 2019-01-15 DIAGNOSIS — G4733 Obstructive sleep apnea (adult) (pediatric): Secondary | ICD-10-CM | POA: Diagnosis not present

## 2019-01-18 ENCOUNTER — Other Ambulatory Visit: Payer: Self-pay | Admitting: Cardiology

## 2019-01-21 ENCOUNTER — Ambulatory Visit (INDEPENDENT_AMBULATORY_CARE_PROVIDER_SITE_OTHER): Payer: Federal, State, Local not specified - PPO | Admitting: Family Medicine

## 2019-01-21 ENCOUNTER — Encounter: Payer: Self-pay | Admitting: Family Medicine

## 2019-01-21 ENCOUNTER — Other Ambulatory Visit: Payer: Self-pay

## 2019-01-21 VITALS — BP 117/78 | HR 60 | Wt 233.0 lb

## 2019-01-21 DIAGNOSIS — E559 Vitamin D deficiency, unspecified: Secondary | ICD-10-CM

## 2019-01-21 DIAGNOSIS — Z23 Encounter for immunization: Secondary | ICD-10-CM | POA: Diagnosis not present

## 2019-01-21 DIAGNOSIS — E782 Mixed hyperlipidemia: Secondary | ICD-10-CM

## 2019-01-21 DIAGNOSIS — I480 Paroxysmal atrial fibrillation: Secondary | ICD-10-CM | POA: Diagnosis not present

## 2019-01-21 DIAGNOSIS — E039 Hypothyroidism, unspecified: Secondary | ICD-10-CM

## 2019-01-21 MED ORDER — LEVOTHYROXINE SODIUM 100 MCG PO TABS: 100.0000 ug | ORAL_TABLET | Freq: Every day | ORAL | 1 refills | Status: DC

## 2019-01-21 MED ORDER — ATORVASTATIN CALCIUM 20 MG PO TABS: 20.0000 mg | ORAL_TABLET | Freq: Every day | ORAL | 1 refills | Status: DC

## 2019-01-21 NOTE — Patient Instructions (Signed)
Thank you for coming in today.  Plan to get labs as ordered in about 6-8 weeks.  Set a reminder for yourself. I will also set a reminder.   I will be moving to full time Sports Medicine in [place] starting on November 1st.  You will still be able to see me for your Sports Medicine or Orthopedic needs at [facility] in [place]. I will still be part of [place].    If you want to stay locally for your Sports Medicine issues Dr. Dianah Field here in [place] will be happy to see you.  Additionally Dr. Clearance Coots at [facility] will be happy to see you for sports medicine issues more locally.   For your primary care needs you are welcome to establish care with Dr. Emeterio Reeve.  Dr Luetta Nutting will be starting in the new year and a new NP Caryl Asp will be starting in December.  We are working quickly to hire more physicians to cover the primary care needs however if you cannot get an appointment with Dr. Sheppard Coil in a timely manner [facility] has locations and openings for primary care services nearby.   [facility] at [facility] [address] . [place] , [place]: [phone] . Behavioral Medicine: [phone] . Fax: [place] at [employer] [address] . [place], [place]: [phone] . Behavioral Medicine: [phone] . Fax: [phone] . Hours (M-F): 7am - Academic librarian At [facility]. [place], [place]: [phone] . Behavioral Medicine: [phone] . Fax: [phone] . Hours (M-F): 8am - Optician, dispensing at [employer] . [place], [place] Phone: [phone] . Behavioral Medicine: [phone] . Fax: [phone]

## 2019-01-21 NOTE — Progress Notes (Signed)
William [place] is a 63 y.o. male who presents to [name]: Primary Care Sports Medicine today for hypothyroidism.  [name] has a history of pretty well controlled hyperthyroidism.  He normally takes levothyroxine 100 mcg daily.  He notes he has been very preoccupied recently.  His mother died on 12-18-22.  She was ill for quite a long time requiring a large amount of care.  He was unable to get himself to the doctor's office for recheck and he ran out of his levothyroxine about a month ago.  He has been fatigued off of the medication and would like to restart.  Only he has a history of atrial fibrillation which is currently quite well managed with flecainide and metoprolol prescribed by cardiology.  He has a follow-up appointment with his cardiologist in the near future to discuss possible ablation.  Additionally he has a history of hyperlipidemia managed with atorvastatin.  Tolerates medication well.    ROS as above:  Exam:  BP 117/78   Pulse 60   Wt 233 lb (105.7 kg)   BMI 31.60 kg/m  Wt Readings from Last 5 Encounters:  01/21/19 233 lb (105.7 kg)  11/13/18 244 lb (110.7 kg)  10/14/18 230 lb (104.3 kg)  04/14/18 244 lb 12.8 oz (111 kg)  03/31/18 247 lb (112 kg)    Gen: Well NAD HEENT: EOMI,  MMM Lungs: Normal work of breathing. CTABL Heart: RRR no MRG Abd: NABS, Soft. Nondistended, Nontender Exts: Brisk capillary refill, warm and well perfused.   Lab and Radiology Results Lab Results  Component Value Date   TSH 1.45 05/06/2018      Assessment and Plan: 63 y.o. male with  Hypothyroidism: Currently off of medication and somewhat symptomatic.  Plan to restart levothyroxine at previously tolerated dose.  Will order labs now to be obtained in about 6 to 8 weeks.  Will check TSH then.  We will also check metabolic panel and lipid panel as this will be due in January.  Refill  chronic medications and check labs then.  Recheck in person in about 6 months with new PCP.  Return sooner if needed.  Flu vaccine given today prior to discharge.  PDMP not reviewed this encounter. Orders Placed This Encounter  Procedures  . Flu Vaccine QUAD 36+ mos IM  . CBC  . COMPLETE METABOLIC PANEL WITH GFR  . Lipid Panel w/reflex Direct LDL  . TSH   Meds ordered this encounter  Medications  . levothyroxine (SYNTHROID) 100 MCG tablet    Sig: Take 1 tablet (100 mcg total) by mouth daily.    Dispense:  90 tablet    Refill:  1  . atorvastatin (LIPITOR) 20 MG tablet    Sig: Take 1 tablet (20 mg total) by mouth daily.    Dispense:  90 tablet    Refill:  1     Historical information moved to improve visibility of documentation.  Past Medical History:  Diagnosis Date  . Atrial fibrillation (HCC) 2017  . Hyperlipidemia   . OSA (obstructive sleep apnea)   . Thyroid disease   . Vitamin D deficiency    Past Surgical History:  Procedure Laterality Date  . CPAP TITRATION  09/22/2015  . EYE SURGERY    . TONSILLECTOMY     Social History   Tobacco Use  . Smoking status: Never Smoker  . Smokeless tobacco: Never Used  Substance Use Topics  . Alcohol use: Yes  family history includes Arrhythmia in his father; Cancer in his brother and father; Heart attack in his mother; Heart disease in his mother.  Medications: Current Outpatient Medications  Medication Sig Dispense Refill  . atorvastatin (LIPITOR) 20 MG tablet Take 1 tablet (20 mg total) by mouth daily. 90 tablet 1  . cetirizine (ZYRTEC) 10 MG tablet Take 10 mg by mouth daily.    . flecainide (TAMBOCOR) 50 MG tablet TAKE 1 TABLET BY MOUTH TWICE A DAY 180 tablet 2  . levothyroxine (SYNTHROID) 100 MCG tablet Take 1 tablet (100 mcg total) by mouth daily. 90 tablet 1  . metoprolol succinate (TOPROL-XL) 25 MG 24 hr tablet TAKE 1 TABLET BY MOUTH EVERY DAY 30 tablet 6   No current facility-administered medications for this  visit.    Allergies  Allergen Reactions  . Penicillins Swelling and Rash    Swelling in joints     Discussed warning signs or symptoms. Please see discharge instructions. Patient expresses understanding.

## 2019-01-27 DIAGNOSIS — R05 Cough: Secondary | ICD-10-CM | POA: Diagnosis not present

## 2019-01-27 DIAGNOSIS — Z20828 Contact with and (suspected) exposure to other viral communicable diseases: Secondary | ICD-10-CM | POA: Diagnosis not present

## 2019-02-01 ENCOUNTER — Ambulatory Visit: Payer: Federal, State, Local not specified - PPO | Admitting: Cardiology

## 2019-02-01 DIAGNOSIS — R05 Cough: Secondary | ICD-10-CM | POA: Diagnosis not present

## 2019-02-01 DIAGNOSIS — Z20828 Contact with and (suspected) exposure to other viral communicable diseases: Secondary | ICD-10-CM | POA: Diagnosis not present

## 2019-02-22 ENCOUNTER — Other Ambulatory Visit: Payer: Self-pay | Admitting: Physician Assistant

## 2019-03-01 ENCOUNTER — Telehealth: Payer: Self-pay | Admitting: Cardiology

## 2019-03-01 NOTE — Telephone Encounter (Signed)
Pt was rescheduled to 12/3. Made aware appt for today was not for him but for his mom, that is the reminder he got.  Pt then reports that he must have forgot to call us after, but that mom passed several months ago.  Aware that I will inform device clinic and we will update his mother's chart. Patient verbalized understanding and agreeable to plan.

## 2019-03-01 NOTE — Telephone Encounter (Signed)
New Message  Patient calling in stating that he was supposed to be seen by Dr. Curt Bears today 03/01/19 at 10:00 am via a phone call that he received. Patient did come into the office and was informed that he did not have any appointment scheduled. Patient would like to be seen today due to him taking off work for his appointment. Is there anyway that Dr. Curt Bears can see patient today. Please give patient a call back to confirm.

## 2019-03-18 ENCOUNTER — Encounter: Payer: Self-pay | Admitting: Cardiology

## 2019-03-18 ENCOUNTER — Ambulatory Visit: Payer: Federal, State, Local not specified - PPO | Admitting: Cardiology

## 2019-03-18 ENCOUNTER — Other Ambulatory Visit: Payer: Self-pay

## 2019-03-18 VITALS — BP 122/78 | HR 61 | Ht 72.0 in | Wt 233.6 lb

## 2019-03-18 DIAGNOSIS — I480 Paroxysmal atrial fibrillation: Secondary | ICD-10-CM

## 2019-03-18 NOTE — Patient Instructions (Signed)
Medication Instructions:  Your physician recommends that you continue on your current medications as directed. Please refer to the Current Medication list given to you today.  * If you need a refill on your cardiac medications before your next appointment, please call your pharmacy.   Labwork: None ordered  Testing/Procedures: None ordered  Follow-Up: At [facility], you and your health needs are our priority.  As part of our continuing mission to provide you with exceptional heart care, we have created designated Provider Care Teams.  These Care Teams include your primary Cardiologist (physician) and Advanced Practice Providers (APPs -  Physician Assistants and Nurse Practitioners) who all work together to provide you with the care you need, when you need it.  You will need a follow up appointment in 6 months.  Please call our office 2 months in advance to schedule this appointment.  You may see Dr Camnitz or one of the following Advanced Practice Providers on your designated Care Team:    Amber Seiler, NP  Renee Ursuy, PA-C  Andy Tillery, PA-C  Thank you for choosing [facility]!!   Joshalyn Ancheta, RN [phone]  Any Other Special Instructions Will Be Listed Below (If Applicable).        

## 2019-03-18 NOTE — Progress Notes (Signed)
Electrophysiology Office Note   Date:  03/18/2019   ID:  William [place], DOB 1956/02/11, MRN [id number]  PCP:  Gregor Hams, MD  Cardiologist:  Debara Pickett Primary Electrophysiologist:  Darious Rehman Meredith Leeds, MD    Chief Complaint: AF   History of Present Illness: William [place] is a 63 y.o. male who is being seen today for the evaluation of AF at the request of Gregor Hams, MD. Presenting today for electrophysiology evaluation.  He has a history significant for paroxysmal atrial fibrillation, hyperlipidemia, and sleep apnea.  He has been having more frequent episodes of atrial fibrillation.  He is currently on flecainide and metoprolol.  Today, he denies symptoms of palpitations, chest pain, shortness of breath, orthopnea, PND, lower extremity edema, claudication, dizziness, presyncope, syncope, bleeding, or neurologic sequela. The patient is tolerating medications without difficulties.  Starting the flecainide he has felt well.  He has had much less atrial fibrillation.   Past Medical History:  Diagnosis Date  . Atrial fibrillation ([place]) 2017  . Hyperlipidemia   . OSA (obstructive sleep apnea)   . Thyroid disease   . Vitamin D deficiency    Past Surgical History:  Procedure Laterality Date  . CPAP TITRATION  09/22/2015  . EYE SURGERY    . TONSILLECTOMY       Current Outpatient Medications  Medication Sig Dispense Refill  . atorvastatin (LIPITOR) 20 MG tablet Take 1 tablet (20 mg total) by mouth daily. 90 tablet 1  . cetirizine (ZYRTEC) 10 MG tablet Take 10 mg by mouth daily.    . flecainide (TAMBOCOR) 50 MG tablet TAKE 1 TABLET BY MOUTH TWICE A DAY 180 tablet 2  . levothyroxine (SYNTHROID) 100 MCG tablet Take 1 tablet (100 mcg total) by mouth daily. 90 tablet 1  . metoprolol succinate (TOPROL-XL) 25 MG 24 hr tablet TAKE 1 TABLET BY MOUTH EVERY DAY 30 tablet 6   No current facility-administered medications for this visit.     Allergies:   Penicillins   Social  History:  The patient  reports that he has never smoked. He has never used smokeless tobacco. He reports current alcohol use. He reports that he does not use drugs.   Family History:  The patient's family history includes Arrhythmia in his father; Cancer in his brother and father; Heart attack in his mother; Heart disease in his mother.    ROS:  Please see the history of present illness.   Otherwise, review of systems is positive for none.   All other systems are reviewed and negative.    PHYSICAL EXAM: VS:  BP 122/78   Pulse 61   Ht 6' (1.829 m)   Wt 233 lb 9.6 oz (106 kg)   SpO2 98%   BMI 31.68 kg/m  , BMI Body mass index is 31.68 kg/m. GEN: Well nourished, well developed, in no acute distress  HEENT: normal  Neck: no JVD, carotid bruits, or masses Cardiac: RRR; no murmurs, rubs, or gallops,no edema  Respiratory:  clear to auscultation bilaterally, normal work of breathing GI: soft, nontender, nondistended, + BS MS: no deformity or atrophy  Skin: warm and dry Neuro:  Strength and sensation are intact Psych: euthymic mood, full affect  EKG:  EKG is ordered today. Personal review of the ekg ordered shows sinus rhythm  Recent Labs: 05/06/2018: ALT 27; BUN 16; Creat 1.08; Hemoglobin 15.4; Platelets 200; Potassium 4.1; Sodium 140; TSH 1.45    Lipid Panel     Component Value Date/Time  CHOL 125 05/06/2018 1337   TRIG 116 05/06/2018 1337   HDL 32 (L) 05/06/2018 1337   CHOLHDL 3.9 05/06/2018 1337   VLDL 17 03/18/2016 0938   LDLCALC 73 05/06/2018 1337     Wt Readings from Last 3 Encounters:  03/18/19 233 lb 9.6 oz (106 kg)  01/21/19 233 lb (105.7 kg)  11/13/18 244 lb (110.7 kg)      Other studies Reviewed: Additional studies/ records that were reviewed today include: TTE 04/22/18  Review of the above records today demonstrates:  - Left ventricle: The cavity size was normal. Wall thickness was   increased in a pattern of mild LVH. Systolic function was normal.   The  estimated ejection fraction was in the range of 60% to 65%.   Wall motion was normal; there were no regional wall motion   abnormalities. The study is indeterminate for the evaluation of   LV diastolic function. - Mitral valve: There was trivial regurgitation. - Left atrium: The atrium was mildly dilated. - Right ventricle: The cavity size was mildly dilated. Wall   thickness was normal. - Right atrium: The atrium was mildly dilated. - Atrial septum: No defect or patent foramen ovale was identified. - Tricuspid valve: There was trivial regurgitation. - Pulmonary arteries: Systolic pressure was within the normal   range.   ASSESSMENT AND PLAN:  1.  Paroxysmal atrial fibrillation: Currently on flecainide and metoprolol.  Not anticoagulated due to low stroke risk.  He is currently tolerating flecainide without much in the way of atrial fibrillation.  At this point, he would prefer to avoid ablation as his mother recently died and he has no sick leave left.  This patients CHA2DS2-VASc Score and unadjusted Ischemic Stroke Rate (% per year) is equal to 0.2 % stroke rate/year from a score of 0  Above score calculated as 1 point each if present [CHF, HTN, DM, Vascular=MI/PAD/Aortic Plaque, Age if 65-74, or Male] Above score calculated as 2 points each if present [Age > 75, or Stroke/TIA/TE]   2.  Obstructive sleep compliance encouraged   Current medicines are reviewed at length with the patient today.   The patient does not have concerns regarding his medicines.  The following changes were made today:  none  Labs/ tests ordered today include:  Orders Placed This Encounter  Procedures  . EKG 12-Lead     Disposition:   FU with [name] 6 months  Signed, Qaadir Kent Jorja Loa, MD  03/18/2019 3:48 PM     [facility] [address] [place] [address] [phone] (office) [phone] (fax)

## 2019-03-29 ENCOUNTER — Encounter: Payer: Self-pay | Admitting: Family Medicine

## 2019-04-19 DIAGNOSIS — G4733 Obstructive sleep apnea (adult) (pediatric): Secondary | ICD-10-CM | POA: Diagnosis not present

## 2019-06-18 ENCOUNTER — Ambulatory Visit (INDEPENDENT_AMBULATORY_CARE_PROVIDER_SITE_OTHER): Payer: Federal, State, Local not specified - PPO

## 2019-06-18 ENCOUNTER — Ambulatory Visit (INDEPENDENT_AMBULATORY_CARE_PROVIDER_SITE_OTHER): Payer: Federal, State, Local not specified - PPO | Admitting: Sports Medicine

## 2019-06-18 ENCOUNTER — Encounter: Payer: Self-pay | Admitting: Sports Medicine

## 2019-06-18 ENCOUNTER — Other Ambulatory Visit: Payer: Self-pay

## 2019-06-18 DIAGNOSIS — L2089 Other atopic dermatitis: Secondary | ICD-10-CM | POA: Diagnosis not present

## 2019-06-18 DIAGNOSIS — M75101 Unspecified rotator cuff tear or rupture of right shoulder, not specified as traumatic: Secondary | ICD-10-CM

## 2019-06-18 DIAGNOSIS — M25511 Pain in right shoulder: Secondary | ICD-10-CM | POA: Diagnosis not present

## 2019-06-18 MED ORDER — MELOXICAM 15 MG PO TABS: ORAL_TABLET | ORAL | 3 refills | Status: DC

## 2019-06-18 NOTE — Patient Instructions (Signed)

## 2019-06-18 NOTE — Assessment & Plan Note (Signed)
Left-sided flexural eczema. It is really not bothering the patient so we will hold off on pharmacologic treatment, he will take lukewarm instead of hot showers, and keep the area moisturized. If it starts to bother him we can use a topical triamcinolone cream.

## 2019-06-18 NOTE — Progress Notes (Signed)
    Procedures performed today:    None.  Independent interpretation of notes and tests performed by another provider:   None.  Impression and Recommendations:    Flexural atopic dermatitis Left-sided flexural eczema. It is really not bothering the patient so we will hold off on pharmacologic treatment, he will take lukewarm instead of hot showers, and keep the area moisturized. If it starts to bother him we can use a topical triamcinolone cream.  Rotator cuff syndrome, right Pain with abduction and extension of his shoulder. He does have some subscapularis signs as well with a positive liftoff sign. Overall no other labral signs, bicipital signs, or impingement signs, negative Yergason test, speeds test, negative O'Brien's test. We will start conservatively, x-rays, meloxicam, home rehab exercise, return to see me in 1 month, subacromial injection if no better, considering the subscapularis origin of his pain we may end up doing a subcoracoid space injection instead.    ___________________________________________ Ihor Austin. Benjamin Stain, M.D., ABFM., CAQSM. Primary Care and Sports Medicine [facility]  Adjunct Instructor of Family Medicine  [employer]

## 2019-06-18 NOTE — Assessment & Plan Note (Signed)
Pain with abduction and extension of his shoulder. He does have some subscapularis signs as well with a positive liftoff sign. Overall no other labral signs, bicipital signs, or impingement signs, negative Yergason test, speeds test, negative O'Brien's test. We will start conservatively, x-rays, meloxicam, home rehab exercise, return to see me in 1 month, subacromial injection if no better, considering the subscapularis origin of his pain we may end up doing a subcoracoid space injection instead.

## 2019-07-10 ENCOUNTER — Other Ambulatory Visit: Payer: Self-pay | Admitting: Family Medicine

## 2019-07-30 ENCOUNTER — Other Ambulatory Visit: Payer: Self-pay

## 2019-07-30 ENCOUNTER — Encounter: Payer: Self-pay | Admitting: Sports Medicine

## 2019-07-30 ENCOUNTER — Ambulatory Visit (INDEPENDENT_AMBULATORY_CARE_PROVIDER_SITE_OTHER): Payer: Federal, State, Local not specified - PPO | Admitting: Sports Medicine

## 2019-07-30 DIAGNOSIS — M75101 Unspecified rotator cuff tear or rupture of right shoulder, not specified as traumatic: Secondary | ICD-10-CM

## 2019-07-30 NOTE — Assessment & Plan Note (Signed)
This is a very pleasant 64 year old male, I saw him about 6 weeks ago with pain with abduction and extension, with subscapularis signs on exam. We gave him some rehab exercises, meloxicam, he took the meloxicam but did not really do the rehab, he does overall feel as though his pain is gone, I have encouraged him to do the rotator cuff rehabilitation, and if persistent discomfort after rehab or recurrence of discomfort we will do a subcoracoid space injection. Otherwise return to see me as needed.

## 2019-07-30 NOTE — Progress Notes (Signed)
    Procedures performed today:    None.  Independent interpretation of notes and tests performed by another provider:   None.  Brief History, Exam, Impression, and Recommendations:    Rotator cuff syndrome, right This is a very pleasant 64 year old male, I saw him about 6 weeks ago with pain with abduction and extension, with subscapularis signs on exam. We gave him some rehab exercises, meloxicam, he took the meloxicam but did not really do the rehab, he does overall feel as though his pain is gone, I have encouraged him to do the rotator cuff rehabilitation, and if persistent discomfort after rehab or recurrence of discomfort we will do a subcoracoid space injection. Otherwise return to see me as needed.    ___________________________________________ Ihor Austin. Benjamin Stain, M.D., ABFM., CAQSM. Primary Care and Sports Medicine [facility]  Adjunct Instructor of Family Medicine  [employer]

## 2019-08-06 ENCOUNTER — Telehealth (INDEPENDENT_AMBULATORY_CARE_PROVIDER_SITE_OTHER): Payer: Federal, State, Local not specified - PPO | Admitting: Family Medicine

## 2019-08-06 ENCOUNTER — Encounter: Payer: Self-pay | Admitting: Family Medicine

## 2019-08-06 DIAGNOSIS — R5383 Other fatigue: Secondary | ICD-10-CM | POA: Diagnosis not present

## 2019-08-06 DIAGNOSIS — Z9989 Dependence on other enabling machines and devices: Secondary | ICD-10-CM | POA: Diagnosis not present

## 2019-08-06 DIAGNOSIS — G4733 Obstructive sleep apnea (adult) (pediatric): Secondary | ICD-10-CM | POA: Diagnosis not present

## 2019-08-06 MED ORDER — AMBULATORY NON FORMULARY MEDICATION: 0 refills | Status: DC

## 2019-08-06 NOTE — Assessment & Plan Note (Signed)
Current machine is not working very well.  Will send in updated rx for APAP machine and supplies with pressure setting 7-15cmH20

## 2019-08-06 NOTE — Progress Notes (Signed)
Patient reports that he is not sure if he is having any side effects from the [facility] vaccine.    He is having some dry cough, fatigue and felt like he had a fever but did not check his temperature   He has taken some Advil and that helps some.   He reports that he has sleep apnea and wants to discuss this.

## 2019-08-06 NOTE — Progress Notes (Addendum)
William Morton - 64 y.o. male MRN [id number]  Date of birth: Apr 10, 1956   This visit type was conducted due to national recommendations for restrictions regarding the COVID-19 Pandemic (e.g. social distancing).  This format is felt to be most appropriate for this patient at this time.  All issues noted in this document were discussed and addressed.  No physical exam was performed (except for noted visual exam findings with Video Visits).  I discussed the limitations of evaluation and management by telemedicine and the availability of in person appointments. The patient expressed understanding and agreed to proceed.  I connected with@ on 08/06/19 at  2:20 PM EDT by a video enabled telemedicine application and verified that I am speaking with the correct person using two identifiers.  Interactive audio and video telecommunications were attempted between this provider and patient, however failed, due to patient having technical difficulties OR patient did not have access to video capability.  We continued and completed visit with audio only.    Present at visit: Everrett Coombe, DO Raymond Gurney   Patient Location: Home [address]  [place] [address]   Provider location:   [facility]  Chief Complaint  Patient presents with  . Fatigue  . Cough  . Fever    this lasted a couple of day.    HPI  Molly Maselli is a 64 y.o. male who presents via audio/video conferencing for a telehealth visit today.  He has complaint of fatigue, mild cough, and low grade fever.  Reports that symptoms started after receiving COVID vaccine last week.  They have improved significantly but still with some fatigue.   He also reports that he wants to get restarted on his CPAP.  He had been caring for his sick mother over the past several months and had not been using machine.  Tried using machine recently and it is not working correctly.  He is requesting new machine and supplies.  Has used [facility] for  supplies previously.     ROS:  A comprehensive ROS was completed and negative except as noted per HPI  Past Medical History:  Diagnosis Date  . Atrial fibrillation (HCC) 2017  . Hyperlipidemia   . OSA (obstructive sleep apnea)   . Thyroid disease   . Vitamin D deficiency     Past Surgical History:  Procedure Laterality Date  . CPAP TITRATION  09/22/2015  . EYE SURGERY    . TONSILLECTOMY      Family History  Problem Relation Age of Onset  . Heart disease Mother   . Heart attack Mother   . Cancer Brother        throat CA  . Arrhythmia Father   . Cancer Father     Social History   Socioeconomic History  . Marital status: Unknown    Spouse name: Not on file  . Number of children: Not on file  . Years of education: Not on file  . Highest education level: Not on file  Occupational History  . Occupation: OFFICER    Employer: [employer]  Tobacco Use  . Smoking status: Never Smoker  . Smokeless tobacco: Never Used  Substance and Sexual Activity  . Alcohol use: Yes  . Drug use: No  . Sexual activity: Not on file  Other Topics Concern  . Not on file  Social History Narrative  . Not on file   Social Determinants of Health   Financial Resource Strain:   . Difficulty of Paying Living Expenses:   Food  Insecurity:   . Worried About Charity fundraiser in the Last Year:   . Arboriculturist in the Last Year:   Transportation Needs:   . Film/video editor (Medical):   Marland Kitchen Lack of Transportation (Non-Medical):   Physical Activity:   . Days of Exercise per Week:   . Minutes of Exercise per Session:   Stress:   . Feeling of Stress :   Social Connections:   . Frequency of Communication with Friends and Family:   . Frequency of Social Gatherings with Friends and Family:   . Attends Religious Services:   . Active Member of Clubs or Organizations:   . Attends Archivist Meetings:   Marland Kitchen Marital Status:   Intimate Partner Violence:   . Fear of Current or Ex-Partner:    . Emotionally Abused:   Marland Kitchen Physically Abused:   . Sexually Abused:      Current Outpatient Medications:  .  atorvastatin (LIPITOR) 20 MG tablet, Take 1 tablet (20 mg total) by mouth daily. Appt/labs for further refills, Disp: 30 tablet, Rfl: 0 .  cetirizine (ZYRTEC) 10 MG tablet, Take 10 mg by mouth daily., Disp: , Rfl:  .  flecainide (TAMBOCOR) 50 MG tablet, TAKE 1 TABLET BY MOUTH TWICE A DAY, Disp: 180 tablet, Rfl: 2 .  levothyroxine (SYNTHROID) 100 MCG tablet, Take 1 tablet (100 mcg total) by mouth daily. Appt/labs for further refills, Disp: 30 tablet, Rfl: 0 .  meloxicam (MOBIC) 15 MG tablet, One tab PO qAM with a meal for 2 weeks, then daily prn pain., Disp: 30 tablet, Rfl: 3 .  metoprolol succinate (TOPROL-XL) 25 MG 24 hr tablet, TAKE 1 TABLET BY MOUTH EVERY DAY, Disp: 30 tablet, Rfl: 6  EXAM:  VITALS per patient if applicable: There were no vitals taken for this visit.  GENERAL: alert, oriented,and in no acute distress  PSYCH/NEURO: pleasant and cooperative, no obvious depression or anxiety, speech and thought processing grossly intact  ASSESSMENT AND PLAN:  Discussed the following assessment and plan:  OSA on CPAP Current machine is not working very well.  Will send in updated rx for APAP machine and supplies with pressure setting 7-15cmH20   Fatigue Fatigue, low grade fever, uri symptoms likely related to recent vaccine.  Improved at this point.  Untreated OSA likely playing a role as well.  CPAP rx renewed.   30 minutes spent including pre visit preparation, review of prior notes and labs, encounter with patient  and same day documentation.    I discussed the assessment and treatment plan with the patient. The patient was provided an opportunity to ask questions and all were answered. The patient agreed with the plan and demonstrated an understanding of the instructions.   The patient was advised to call back or seek an in-person evaluation if the symptoms worsen or  if the condition fails to improve as anticipated.    Luetta Nutting, DO

## 2019-08-06 NOTE — Progress Notes (Addendum)
I have faxed the orders to [facility] and patietn is aware. Received fax confirmation. Patient is aware.

## 2019-08-06 NOTE — Assessment & Plan Note (Signed)
Fatigue, low grade fever, uri symptoms likely related to recent vaccine.  Improved at this point.  Untreated OSA likely playing a role as well.  CPAP rx renewed.

## 2019-08-14 ENCOUNTER — Other Ambulatory Visit: Payer: Self-pay | Admitting: Family Medicine

## 2019-08-16 NOTE — Telephone Encounter (Signed)
No longer a pt at [facility]

## 2019-08-17 NOTE — Telephone Encounter (Signed)
Please route to new PCP

## 2019-08-26 ENCOUNTER — Encounter: Payer: Self-pay | Admitting: Family Medicine

## 2019-08-26 ENCOUNTER — Ambulatory Visit: Payer: Federal, State, Local not specified - PPO | Admitting: Family Medicine

## 2019-08-26 ENCOUNTER — Other Ambulatory Visit: Payer: Self-pay

## 2019-08-26 DIAGNOSIS — N481 Balanitis: Secondary | ICD-10-CM

## 2019-08-26 MED ORDER — NYSTATIN-TRIAMCINOLONE 100000-0.1 UNIT/GM-% EX OINT: 1.0000 "application " | TOPICAL_OINTMENT | Freq: Two times a day (BID) | CUTANEOUS | 0 refills | Status: DC

## 2019-08-26 MED ORDER — MUPIROCIN 2 % EX OINT: 1.0000 "application " | TOPICAL_OINTMENT | Freq: Two times a day (BID) | CUTANEOUS | 0 refills | Status: AC

## 2019-08-26 MED ORDER — AMBULATORY NON FORMULARY MEDICATION: 0 refills | Status: DC

## 2019-08-26 NOTE — Progress Notes (Signed)
William Morton - 64 y.o. male MRN [id number]  Date of birth: 1956-01-14  Subjective Chief Complaint  Patient presents with  . Rash    HPI William Morton is a 64 y.o. male here today with complaint of rash on penis.  He reports that this is a recurrent issue.  Area is red, inflamed and painful.  This episode started a few days ago.  He denies drainage, pain with urination, fever or chills.   ROS:  A comprehensive ROS was completed and negative except as noted per HPI   Allergies  Allergen Reactions  . Penicillins Swelling and Rash    Swelling in joints    Past Medical History:  Diagnosis Date  . Atrial fibrillation (HCC) 2017  . Hyperlipidemia   . OSA (obstructive sleep apnea)   . Thyroid disease   . Vitamin D deficiency     Past Surgical History:  Procedure Laterality Date  . CPAP TITRATION  09/22/2015  . EYE SURGERY    . TONSILLECTOMY      Social History   Socioeconomic History  . Marital status: Unknown    Spouse name: Not on file  . Number of children: Not on file  . Years of education: Not on file  . Highest education level: Not on file  Occupational History  . Occupation: OFFICER    Employer: [employer]  Tobacco Use  . Smoking status: Never Smoker  . Smokeless tobacco: Never Used  Substance and Sexual Activity  . Alcohol use: Yes  . Drug use: No  . Sexual activity: Not on file  Other Topics Concern  . Not on file  Social History Narrative  . Not on file   Social Determinants of Health   Financial Resource Strain:   . Difficulty of Paying Living Expenses:   Food Insecurity:   . Worried About Programme researcher, broadcasting/film/video in the Last Year:   . Barista in the Last Year:   Transportation Needs:   . Freight forwarder (Medical):   Marland Kitchen Lack of Transportation (Non-Medical):   Physical Activity:   . Days of Exercise per Week:   . Minutes of Exercise per Session:   Stress:   . Feeling of Stress :   Social Connections:   . Frequency of Communication  with Friends and Family:   . Frequency of Social Gatherings with Friends and Family:   . Attends Religious Services:   . Active Member of Clubs or Organizations:   . Attends Banker Meetings:   Marland Kitchen Marital Status:     Family History  Problem Relation Age of Onset  . Heart disease Mother   . Heart attack Mother   . Cancer Brother        throat CA  . Arrhythmia Father   . Cancer Father     Health Maintenance  Topic Date Due  . INFLUENZA VACCINE  11/14/2019  . COLONOSCOPY  04/15/2020  . TETANUS/TDAP  03/20/2027  . COVID-19 Vaccine  Completed  . Hepatitis C Screening  Completed  . HIV Screening  Completed     ----------------------------------------------------------------------------------------------------------------------------------------------------------------------------------------------------------------- Physical Exam BP 120/68 (BP Location: Right Arm, Patient Position: Sitting, Cuff Size: Large)   Pulse (!) 58   Temp (!) 97.5 F (36.4 C) (Temporal)   Ht 6' 0.05" (1.83 m)   Wt 227 lb 3.2 oz (103.1 kg)   SpO2 96%   BMI 30.77 kg/m   Physical Exam Constitutional:      Appearance: Normal appearance.  HENT:     Head: Normocephalic and atraumatic.  Cardiovascular:     Rate and Rhythm: Normal rate and regular rhythm.  Pulmonary:     Effort: Pulmonary effort is normal.     Breath sounds: Normal breath sounds.  Genitourinary:    Comments: Erythema along glans and coronal ridge of penis.  TTP.  No drainage or vesicular lesions noted.  Neurological:     Mental Status: He is alert.     ------------------------------------------------------------------------------------------------------------------------------------------------------------------------------------------------------------------- Assessment and Plan  Balanitis Will treat with mycolog and mupirocin x7-10 days.  Discussed he should keep area clean and dry.  Suggested using jock itch  powder to area if working outside, sweating etc for moisture control.  He will let me know if not improving.    Meds ordered this encounter  Medications  . nystatin-triamcinolone ointment (MYCOLOG)    Sig: Apply 1 application topically 2 (two) times daily. Use for 7-10 days.    Dispense:  30 g    Refill:  0  . mupirocin ointment (BACTROBAN) 2 %    Sig: Apply 1 application topically 2 (two) times daily for 7 days.    Dispense:  22 g    Refill:  0  . AMBULATORY NON FORMULARY MEDICATION    Sig: Please supply APAP machine with humidfier, tubing, mask and filters Auto titration:  7-15cmH20 No EPR.  Mask: Per patient preference.  Previously Fitted for [employer] and [employer] Simplus Full Face Mask Send Compliance data after 30 days to Dr. Zigmund Daniel at [phone]    Dispense:  1 Device    Refill:  0    No follow-ups on file.    This visit occurred during the SARS-CoV-2 public health emergency.  Safety protocols were in place, including screening questions prior to the visit, additional usage of staff PPE, and extensive cleaning of exam room while observing appropriate contact time as indicated for disinfecting solutions.

## 2019-08-26 NOTE — Assessment & Plan Note (Signed)
Will treat with mycolog and mupirocin x7-10 days.  Discussed he should keep area clean and dry.  Suggested using jock itch powder to area if working outside, sweating etc for moisture control.  He will let me know if not improving.

## 2019-08-26 NOTE — Patient Instructions (Signed)
Use creams for 7-10 days until resolution.  If worsening let me know.  If outside or sweating more use jock itch powder (zeasorb, lotrimin) to help with moisture control and prevention of this.

## 2019-09-01 ENCOUNTER — Other Ambulatory Visit: Payer: Self-pay | Admitting: Family Medicine

## 2019-09-01 ENCOUNTER — Other Ambulatory Visit: Payer: Self-pay | Admitting: Medical-Surgical

## 2019-09-01 MED ORDER — ATORVASTATIN CALCIUM 20 MG PO TABS: 20.0000 mg | ORAL_TABLET | Freq: Every day | ORAL | 1 refills | Status: DC

## 2019-09-01 NOTE — Telephone Encounter (Signed)
Routing to PCP. Due for labs.

## 2019-09-21 ENCOUNTER — Other Ambulatory Visit: Payer: Self-pay

## 2019-09-21 ENCOUNTER — Telehealth: Payer: Self-pay

## 2019-09-21 DIAGNOSIS — Z9989 Dependence on other enabling machines and devices: Secondary | ICD-10-CM

## 2019-09-21 DIAGNOSIS — G4733 Obstructive sleep apnea (adult) (pediatric): Secondary | ICD-10-CM

## 2019-09-21 MED ORDER — AMBULATORY NON FORMULARY MEDICATION: 0 refills | Status: DC

## 2019-09-21 NOTE — Telephone Encounter (Signed)
Attempted to contact [facility] x 3 prior to speaking to someone concerning William Morton CPAP.   Spoke to William Morton [phone]) who states she did not receive any of the previously faxed documentation.   Faxed [phone]) demographics, insurance cards, visit notes, and Rx for CPAP to [facility].

## 2019-11-08 ENCOUNTER — Other Ambulatory Visit: Payer: Self-pay | Admitting: Medical-Surgical

## 2019-11-09 NOTE — Telephone Encounter (Signed)
Routing to PCP

## 2019-11-12 NOTE — Telephone Encounter (Signed)
Sent msg to Scheduling to contact the patient for an appointment and labs.

## 2019-11-12 NOTE — Telephone Encounter (Signed)
Patient overdue for labs. 

## 2019-12-22 ENCOUNTER — Other Ambulatory Visit: Payer: Self-pay | Admitting: Family Medicine

## 2019-12-22 MED ORDER — LEVOTHYROXINE SODIUM 100 MCG PO TABS: 100.0000 ug | ORAL_TABLET | Freq: Every day | ORAL | 0 refills | Status: DC

## 2019-12-23 ENCOUNTER — Ambulatory Visit: Payer: Federal, State, Local not specified - PPO | Admitting: Cardiology

## 2019-12-23 ENCOUNTER — Other Ambulatory Visit: Payer: Self-pay

## 2019-12-23 ENCOUNTER — Encounter: Payer: Self-pay | Admitting: Cardiology

## 2019-12-23 VITALS — BP 118/76 | HR 59 | Ht 72.0 in | Wt 228.4 lb

## 2019-12-23 DIAGNOSIS — I480 Paroxysmal atrial fibrillation: Secondary | ICD-10-CM

## 2019-12-23 NOTE — Patient Instructions (Signed)
Medication Instructions:  Your physician recommends that you continue on your current medications as directed. Please refer to the Current Medication list given to you today.  *If you need a refill on your cardiac medications before your next appointment, please call your pharmacy*   Lab Work: None ordered   Testing/Procedures: None ordered   Follow-Up: At [facility], you and your health needs are our priority.  As part of our continuing mission to provide you with exceptional heart care, we have created designated Provider Care Teams.  These Care Teams include your primary Cardiologist (physician) and Advanced Practice Providers (APPs -  Physician Assistants and Nurse Practitioners) who all work together to provide you with the care you need, when you need it.  We recommend signing up for the patient portal called "MyChart".  Sign up information is provided on this After Visit Summary.  MyChart is used to connect with patients for Virtual Visits (Telemedicine).  Patients are able to view lab/test results, encounter notes, upcoming appointments, etc.  Non-urgent messages can be sent to your provider as well.   To learn more about what you can do with MyChart, go to [email or web address].    Your next appointment:   6 month(s)  The format for your next appointment:   In Person  Provider:   Loman Brooklyn, MD   Thank you for choosing [facility]!!   Dory Horn, RN [phone]    Other Instructions   Cardiac Ablation Cardiac ablation is a procedure to disable (ablate) a small amount of heart tissue in very specific places. The heart has many electrical connections. Sometimes these connections are abnormal and can cause the heart to beat very fast or irregularly. Ablating some of the problem areas can improve the heart rhythm or return it to normal. Ablation may be done for people who:  Have Wolff-Parkinson-White syndrome.  Have fast heart rhythms  (tachycardia).  Have taken medicines for an abnormal heart rhythm (arrhythmia) that were not effective or caused side effects.  Have a high-risk heartbeat that may be life-threatening. During the procedure, a small incision is made in the neck or the groin, and a long, thin, flexible tube (catheter) is inserted into the incision and moved to the heart. Small devices (electrodes) on the tip of the catheter will send out electrical currents. A type of X-ray (fluoroscopy) will be used to help guide the catheter and to provide images of the heart. Tell a health care provider about:  Any allergies you have.  All medicines you are taking, including vitamins, herbs, eye drops, creams, and over-the-counter medicines.  Any problems you or family members have had with anesthetic medicines.  Any blood disorders you have.  Any surgeries you have had.  Any medical conditions you have, such as kidney failure.  Whether you are pregnant or may be pregnant. What are the risks? Generally, this is a safe procedure. However, problems may occur, including:  Infection.  Bruising and bleeding at the catheter insertion site.  Bleeding into the chest, especially into the sac that surrounds the heart. This is a serious complication.  Stroke or blood clots.  Damage to other structures or organs.  Allergic reaction to medicines or dyes.  Need for a permanent pacemaker if the normal electrical system is damaged. A pacemaker is a small computer that sends electrical signals to the heart and helps your heart beat normally.  The procedure not being fully effective. This may not be recognized until months later. Repeat ablation procedures  are sometimes required. What happens before the procedure?  Follow instructions from your health care provider about eating or drinking restrictions.  Ask your health care provider about: ? Changing or stopping your regular medicines. This is especially important if you  are taking diabetes medicines or blood thinners. ? Taking medicines such as aspirin and ibuprofen. These medicines can thin your blood. Do not take these medicines before your procedure if your health care provider instructs you not to.  Plan to have someone take you home from the hospital or clinic.  If you will be going home right after the procedure, plan to have someone with you for 24 hours. What happens during the procedure?  To lower your risk of infection: ? Your health care team will wash or sanitize their hands. ? Your skin will be washed with soap. ? Hair may be removed from the incision area.  An IV tube will be inserted into one of your veins.  You will be given a medicine to help you relax (sedative).  The skin on your neck or groin will be numbed.  An incision will be made in your neck or your groin.  A needle will be inserted through the incision and into a large vein in your neck or groin.  A catheter will be inserted into the needle and moved to your heart.  Dye may be injected through the catheter to help your surgeon see the area of the heart that needs treatment.  Electrical currents will be sent from the catheter to ablate heart tissue in desired areas. There are three types of energy that may be used to ablate heart tissue: ? Heat (radiofrequency energy). ? Laser energy. ? Extreme cold (cryoablation).  When the necessary tissue has been ablated, the catheter will be removed.  Pressure will be held on the catheter insertion area to prevent excessive bleeding.  A bandage (dressing) will be placed over the catheter insertion area. The procedure may vary among health care providers and hospitals. What happens after the procedure?  Your blood pressure, heart rate, breathing rate, and blood oxygen level will be monitored until the medicines you were given have worn off.  Your catheter insertion area will be monitored for bleeding. You will need to lie still  for a few hours to ensure that you do not bleed from the catheter insertion area.  Do not drive for 24 hours or as long as directed by your health care provider. Summary  Cardiac ablation is a procedure to disable (ablate) a small amount of heart tissue in very specific places. Ablating some of the problem areas can improve the heart rhythm or return it to normal.  During the procedure, electrical currents will be sent from the catheter to ablate heart tissue in desired areas. This information is not intended to replace advice given to you by your health care provider. Make sure you discuss any questions you have with your health care provider. Document Revised: 09/22/2017 Document Reviewed: 02/19/2016 Elsevier Patient Education  2020 [employer].   ]

## 2019-12-23 NOTE — Progress Notes (Signed)
Electrophysiology Office Note   Date:  12/23/2019   ID:  William Morton, DOB 02-03-1956, MRN [id number]  PCP:  Everrett Coombe, DO  Cardiologist:  Hilty Primary Electrophysiologist:  Maranda Marte Jorja Loa, MD    Chief Complaint: AF   History of Present Illness: William Morton is a 64 y.o. male who is being seen today for the evaluation of AF at the request of Everrett Coombe, DO. Presenting today for electrophysiology evaluation.  He has a history significant for paroxysmal atrial fibrillation, hyperlipidemia, and sleep apnea.  He has been having more frequent episodes of atrial fibrillation.  He is currently on flecainide and metoprolol.  Today, denies symptoms of palpitations, chest pain, shortness of breath, orthopnea, PND, lower extremity edema, claudication, dizziness, presyncope, syncope, bleeding, or neurologic sequela. The patient is tolerating medications without difficulties.  He currently feels well.  He continues to have episodes of atrial fibrillation that occur once every few weeks.  His episodes last a few hours and then go away.  He has not had any other prolonged episodes.  He otherwise feels well.  He is interested in ablation, but has a lot going on at the moment and would like to hold off for the next 6 months.  Past Medical History:  Diagnosis Date  . Atrial fibrillation (HCC) 2017  . Hyperlipidemia   . OSA (obstructive sleep apnea)   . Thyroid disease   . Vitamin D deficiency    Past Surgical History:  Procedure Laterality Date  . CPAP TITRATION  09/22/2015  . EYE SURGERY    . TONSILLECTOMY       Current Outpatient Medications  Medication Sig Dispense Refill  . AMBULATORY NON FORMULARY MEDICATION Please supply APAP machine with humidfier, tubing, mask and filters Auto titration:  7-15cmH20 No EPR.  Mask: Per patient preference.  Previously Fitted for [employer] and [employer] Simplus Full Face Mask Send Compliance data after 30 days to Dr. Ashley Royalty at  [phone] 1 Device 0  . atorvastatin (LIPITOR) 20 MG tablet Take 1 tablet (20 mg total) by mouth daily. 90 tablet 1  . cetirizine (ZYRTEC) 10 MG tablet Take 10 mg by mouth daily.    . flecainide (TAMBOCOR) 50 MG tablet TAKE 1 TABLET BY MOUTH TWICE A DAY 180 tablet 2  . levothyroxine (SYNTHROID) 100 MCG tablet Take 1 tablet (100 mcg total) by mouth daily. Appt/labs for further refills 7 tablet 0  . meloxicam (MOBIC) 15 MG tablet One tab PO qAM with a meal for 2 weeks, then daily prn pain. 30 tablet 3  . metoprolol succinate (TOPROL-XL) 25 MG 24 hr tablet TAKE 1 TABLET BY MOUTH EVERY DAY 30 tablet 6  . nystatin-triamcinolone ointment (MYCOLOG) Apply 1 application topically 2 (two) times daily. Use for 7-10 days. 30 g 0   No current facility-administered medications for this visit.    Allergies:   Penicillins   Social History:  The patient  reports that he has never smoked. He has never used smokeless tobacco. He reports current alcohol use. He reports that he does not use drugs.   Family History:  The patient's family history includes Arrhythmia in his father; Cancer in his brother and father; Heart attack in his mother; Heart disease in his mother.    ROS:  Please see the history of present illness.   Otherwise, review of systems is positive for none.   All other systems are reviewed and negative.   PHYSICAL EXAM: VS:  There were no vitals taken for  this visit. , BMI There is no height or weight on file to calculate BMI. GEN: Well nourished, well developed, in no acute distress  HEENT: normal  Neck: no JVD, carotid bruits, or masses Cardiac: RRR; no murmurs, rubs, or gallops,no edema  Respiratory:  clear to auscultation bilaterally, normal work of breathing GI: soft, nontender, nondistended, + BS MS: no deformity or atrophy  Skin: warm and dry Neuro:  Strength and sensation are intact Psych: euthymic mood, full affect  EKG:  EKG is ordered today. Personal review of the ekg  ordered shows sinus rhythm, rate 59  Recent Labs: No results found for requested labs within last 8760 hours.    Lipid Panel     Component Value Date/Time   CHOL 125 05/06/2018 1337   TRIG 116 05/06/2018 1337   HDL 32 (L) 05/06/2018 1337   CHOLHDL 3.9 05/06/2018 1337   VLDL 17 03/18/2016 0938   LDLCALC 73 05/06/2018 1337     Wt Readings from Last 3 Encounters:  08/26/19 227 lb 3.2 oz (103.1 kg)  06/18/19 236 lb 1.9 oz (107.1 kg)  03/18/19 233 lb 9.6 oz (106 kg)      Other studies Reviewed: Additional studies/ records that were reviewed today include: TTE 04/22/18  Review of the above records today demonstrates:  - Left ventricle: The cavity size was normal. Wall thickness was   increased in a pattern of mild LVH. Systolic function was normal.   The estimated ejection fraction was in the range of 60% to 65%.   Wall motion was normal; there were no regional wall motion   abnormalities. The study is indeterminate for the evaluation of   LV diastolic function. - Mitral valve: There was trivial regurgitation. - Left atrium: The atrium was mildly dilated. - Right ventricle: The cavity size was mildly dilated. Wall   thickness was normal. - Right atrium: The atrium was mildly dilated. - Atrial septum: No defect or patent foramen ovale was identified. - Tricuspid valve: There was trivial regurgitation. - Pulmonary arteries: Systolic pressure was within the normal   range.   ASSESSMENT AND PLAN:  1.  Paroxysmal atrial fibrillation: Currently on flecainide (monitoring for this high risk medication) and metoprolol.  CHA2DS2-VASc of 0.  Not anticoagulated due to a low stroke risk.  He is continued to have short episodes of atrial fibrillation approximately twice a month.  He is comfortable with his control now, but would like to have ablation performed down the road.  I Marquise Lambson see him back in 6 months for further discussions.   2.  Obstructive sleep apnea: CPAP compliance  encouraged   Current medicines are reviewed at length with the patient today.   The patient does not have concerns regarding his medicines.  The following changes were made today: None  Labs/ tests ordered today include:  No orders of the defined types were placed in this encounter.    Disposition:   FU with Aja Bolander 6 months  Signed, Mikala Podoll Jorja Loa, MD  12/23/2019 8:39 AM     [facility] [address] [place] [address] [phone] (office) [phone] (fax)

## 2019-12-29 ENCOUNTER — Encounter: Payer: Self-pay | Admitting: Family Medicine

## 2019-12-29 ENCOUNTER — Other Ambulatory Visit: Payer: Self-pay

## 2019-12-29 ENCOUNTER — Ambulatory Visit (INDEPENDENT_AMBULATORY_CARE_PROVIDER_SITE_OTHER): Payer: Federal, State, Local not specified - PPO | Admitting: Family Medicine

## 2019-12-29 DIAGNOSIS — Z0000 Encounter for general adult medical examination without abnormal findings: Secondary | ICD-10-CM

## 2019-12-29 DIAGNOSIS — E039 Hypothyroidism, unspecified: Secondary | ICD-10-CM

## 2019-12-29 DIAGNOSIS — R3121 Asymptomatic microscopic hematuria: Secondary | ICD-10-CM | POA: Diagnosis not present

## 2019-12-29 DIAGNOSIS — E782 Mixed hyperlipidemia: Secondary | ICD-10-CM | POA: Diagnosis not present

## 2019-12-29 DIAGNOSIS — Z23 Encounter for immunization: Secondary | ICD-10-CM | POA: Diagnosis not present

## 2019-12-29 NOTE — Assessment & Plan Note (Signed)
Well adult Chronic conditions remain well controlled.  Orders Placed This Encounter  Procedures  . Flu Vaccine QUAD 6+ mos PF IM (Fluarix Quad PF)  . COMPLETE METABOLIC PANEL WITH GFR  . CBC  . Lipid Profile  . TSH  . PSA  Screening: PSA Immunizations: flu vaccine given today.  Anticipatory guidance/Risk factor reduction:  Recommendations per AVS

## 2019-12-29 NOTE — Progress Notes (Signed)
William Morton - 64 y.o. male MRN [id number]  Date of birth: [birth year]  Subjective Chief Complaint  Patient presents with  . Annual Exam    HPI William Morton is a 64 y.o. male here today for annual exam.  He is due to have updated labs as well.  He continues to see cardiology for his A. Fib.  This remains well controlled with flecainide and metoprolol.  He is not currently on anticoagulation.    Tolerating atorvastatin well without myalgias.  He feels good with current dose of levothyroxine.   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  He feels that is diet is pretty good.   He is a non-smoker and consumes a couple of servings of EtOH each week.   Review of Systems  Constitutional: Negative for chills, fever, malaise/fatigue and weight loss.  HENT: Negative for congestion, ear pain and sore throat.   Eyes: Negative for blurred vision, double vision and pain.  Respiratory: Negative for cough and shortness of breath.   Cardiovascular: Negative for chest pain and palpitations.  Gastrointestinal: Negative for abdominal pain, blood in stool, constipation, heartburn and nausea.  Genitourinary: Negative for dysuria and urgency.  Musculoskeletal: Negative for joint pain and myalgias.  Neurological: Negative for dizziness and headaches.  Endo/Heme/Allergies: Does not bruise/bleed easily.  Psychiatric/Behavioral: Negative for depression. The patient is not nervous/anxious and does not have insomnia.     Allergies  Allergen Reactions  . Penicillins Swelling and Rash    Swelling in joints    Past Medical History:  Diagnosis Date  . Atrial fibrillation (HCC) 2017  . Hyperlipidemia   . OSA (obstructive sleep apnea)   . Thyroid disease   . Vitamin D deficiency     Past Surgical History:  Procedure Laterality Date  . CPAP TITRATION  09/22/2015  . EYE SURGERY    . TONSILLECTOMY      Social History   Socioeconomic History  .  Marital status: Unknown    Spouse name: Not on file  . Number of children: Not on file  . Years of education: Not on file  . Highest education level: Not on file  Occupational History  . Occupation: OFFICER    Employer: [employer]  Tobacco Use  . Smoking status: Never Smoker  . Smokeless tobacco: Never Used  Substance and Sexual Activity  . Alcohol use: Yes  . Drug use: No  . Sexual activity: Not on file  Other Topics Concern  . Not on file  Social History Narrative  . Not on file   Social Determinants of Health   Financial Resource Strain:   . Difficulty of Paying Living Expenses: Not on file  Food Insecurity:   . Worried About Programme researcher, broadcasting/film/video in the Last Year: Not on file  . Ran Out of Food in the Last Year: Not on file  Transportation Needs:   . Lack of Transportation (Medical): Not on file  . Lack of Transportation (Non-Medical): Not on file  Physical Activity:   . Days of Exercise per Week: Not on file  . Minutes of Exercise per Session: Not on file  Stress:   . Feeling of Stress : Not on file  Social Connections:   . Frequency of Communication with Friends and Family: Not on file  . Frequency of Social Gatherings with Friends and Family: Not on file  . Attends Religious Services: Not on file  . Active Member  of Clubs or Organizations: Not on file  . Attends Banker Meetings: Not on file  . Marital Status: Not on file    Family History  Problem Relation Age of Onset  . Heart disease Mother   . Heart attack Mother   . Cancer Brother        throat CA  . Arrhythmia Father   . Cancer Father     Health Maintenance  Topic Date Due  . COLONOSCOPY  04/15/2020  . TETANUS/TDAP  03/20/2027  . INFLUENZA VACCINE  Completed  . COVID-19 Vaccine  Completed  . Hepatitis C Screening  Completed  . HIV Screening  Completed      ----------------------------------------------------------------------------------------------------------------------------------------------------------------------------------------------------------------- Physical Exam BP 128/66 (BP Location: Left Arm, Patient Position: Sitting, Cuff Size: Large)   Pulse 69   Temp 97.9 F (36.6 C)   Ht 6' 0.05" (1.83 m)   Wt 228 lb 12.8 oz (103.8 kg)   SpO2 100%   BMI 30.99 kg/m   Physical Exam Constitutional:      General: He is not in acute distress. HENT:     Head: Normocephalic and atraumatic.     Right Ear: External ear normal.     Left Ear: External ear normal.  Eyes:     General: No scleral icterus. Neck:     Thyroid: No thyromegaly.  Cardiovascular:     Rate and Rhythm: Normal rate and regular rhythm.     Heart sounds: Normal heart sounds.  Pulmonary:     Effort: Pulmonary effort is normal.     Breath sounds: Normal breath sounds.  Abdominal:     General: Bowel sounds are normal. There is no distension.     Palpations: Abdomen is soft.     Tenderness: There is no abdominal tenderness. There is no guarding.  Musculoskeletal:     Cervical back: Normal range of motion.  Lymphadenopathy:     Cervical: No cervical adenopathy.  Skin:    General: Skin is warm and dry.     Findings: No rash.  Neurological:     Mental Status: He is alert and oriented to person, place, and time.     Cranial Nerves: No cranial nerve deficit.     Motor: No abnormal muscle tone.  Psychiatric:        Behavior: Behavior normal.     ------------------------------------------------------------------------------------------------------------------------------------------------------------------------------------------------------------------- Assessment and Plan  Well adult exam Well adult Chronic conditions remain well controlled.  Orders Placed This Encounter  Procedures  . Flu Vaccine QUAD 6+ mos PF IM (Fluarix Quad PF)  . COMPLETE  METABOLIC PANEL WITH GFR  . CBC  . Lipid Profile  . TSH  . PSA  Screening: PSA Immunizations: flu vaccine given today.  Anticipatory guidance/Risk factor reduction:  Recommendations per AVS   No orders of the defined types were placed in this encounter.   Return in about 6 months (around 06/27/2020) for Hypothyroid/HTN.    This visit occurred during the SARS-CoV-2 public health emergency.  Safety protocols were in place, including screening questions prior to the visit, additional usage of staff PPE, and extensive cleaning of exam room while observing appropriate contact time as indicated for disinfecting solutions.

## 2019-12-29 NOTE — Patient Instructions (Signed)
Preventive Care 41-62 Years Old, Male Preventive care refers to lifestyle choices and visits with your health care provider that can promote health and wellness. This includes:  A yearly physical exam. This is also called an annual well check.  Regular dental and eye exams.  Immunizations.  Screening for certain conditions.  Healthy lifestyle choices, such as eating a healthy diet, getting regular exercise, not using drugs or products that contain nicotine and tobacco, and limiting alcohol use. What can I expect for my preventive care visit? Physical exam Your health care provider will check:  Height and weight. These may be used to calculate body mass index (BMI), which is a measurement that tells if you are at a healthy weight.  Heart rate and blood pressure.  Your skin for abnormal spots. Counseling Your health care provider may ask you questions about:  Alcohol, tobacco, and drug use.  Emotional well-being.  Home and relationship well-being.  Sexual activity.  Eating habits.  Work and work Statistician. What immunizations do I need?  Influenza (flu) vaccine  This is recommended every year. Tetanus, diphtheria, and pertussis (Tdap) vaccine  You may need a Td booster every 10 years. Varicella (chickenpox) vaccine  You may need this vaccine if you have not already been vaccinated. Zoster (shingles) vaccine  You may need this after age 62. Measles, mumps, and rubella (MMR) vaccine  You may need at least one dose of MMR if you were born in 1957 or later. You may also need a second dose. Pneumococcal conjugate (PCV13) vaccine  You may need this if you have certain conditions and were not previously vaccinated. Pneumococcal polysaccharide (PPSV23) vaccine  You may need one or two doses if you smoke cigarettes or if you have certain conditions. Meningococcal conjugate (MenACWY) vaccine  You may need this if you have certain conditions. Hepatitis A  vaccine  You may need this if you have certain conditions or if you travel or work in places where you may be exposed to hepatitis A. Hepatitis B vaccine  You may need this if you have certain conditions or if you travel or work in places where you may be exposed to hepatitis B. Haemophilus influenzae type b (Hib) vaccine  You may need this if you have certain risk factors. Human papillomavirus (HPV) vaccine  If recommended by your health care provider, you may need three doses over 6 months. You may receive vaccines as individual doses or as more than one vaccine together in one shot (combination vaccines). Talk with your health care provider about the risks and benefits of combination vaccines. What tests do I need? Blood tests  Lipid and cholesterol levels. These may be checked every 5 years, or more frequently if you are over 60 years old.  Hepatitis C test.  Hepatitis B test. Screening  Lung cancer screening. You may have this screening every year starting at age 62 if you have a 30-pack-year history of smoking and currently smoke or have quit within the past 15 years.  Prostate cancer screening. Recommendations will vary depending on your family history and other risks.  Colorectal cancer screening. All adults should have this screening starting at age 62 and continuing until age 62. Your health care provider may recommend screening at age 62 if you are at increased risk. You will have tests every 1-10 years, depending on your results and the type of screening test.  Diabetes screening. This is done by checking your blood sugar (glucose) after you have not eaten  for a while (fasting). You may have this done every 1-3 years.  Sexually transmitted disease (STD) testing. Follow these instructions at home: Eating and drinking  Eat a diet that includes fresh fruits and vegetables, whole grains, lean protein, and low-fat dairy products.  Take vitamin and mineral supplements as  recommended by your health care provider.  Do not drink alcohol if your health care provider tells you not to drink.  If you drink alcohol: ? Limit how much you have to 0-2 drinks a day. ? Be aware of how much alcohol is in your drink. In the [place] one drink equals one 12 oz bottle of beer (355 mL), one 5 oz glass of wine (148 mL), or one 1 oz glass of hard liquor (44 mL). Lifestyle  Take daily care of your teeth and gums.  Stay active. Exercise for at least 30 minutes on 5 or more days each week.  Do not use any products that contain nicotine or tobacco, such as cigarettes, e-cigarettes, and chewing tobacco. If you need help quitting, ask your health care provider.  If you are sexually active, practice safe sex. Use a condom or other form of protection to prevent STIs (sexually transmitted infections).  Talk with your health care provider about taking a low-dose aspirin every day starting at age 62. What's next?  Go to your health care provider once a year for a well check visit.  Ask your health care provider how often you should have your eyes and teeth checked.  Stay up to date on all vaccines. This information is not intended to replace advice given to you by your health care provider. Make sure you discuss any questions you have with your health care provider. Document Revised: 03/26/2018 Document Reviewed: 03/26/2018 Elsevier Patient Education  2020 [employer].

## 2019-12-30 LAB — COMPLETE METABOLIC PANEL WITH GFR
AG Ratio: 1.9 (calc) (ref 1.0–2.5)
ALT: 16 U/L (ref 9–46)
AST: 19 U/L (ref 10–35)
Albumin: 4.6 g/dL (ref 3.6–5.1)
Alkaline phosphatase (APISO): 60 U/L (ref 35–144)
BUN: 13 mg/dL (ref 7–25)
CO2: 27 mmol/L (ref 20–32)
Calcium: 9.5 mg/dL (ref 8.6–10.3)
Chloride: 104 mmol/L (ref 98–110)
Creat: 0.98 mg/dL (ref 0.70–1.25)
GFR, Est African American: 95 mL/min/{1.73_m2} (ref >=60)
GFR, Est Non African American: 82 mL/min/{1.73_m2} (ref >=60)
Globulin: 2.4 g/dL (calc) (ref 1.9–3.7)
Glucose, Bld: 85 mg/dL (ref 65–99)
Potassium: 4.1 mmol/L (ref 3.5–5.3)
Sodium: 139 mmol/L (ref 135–146)
Total Bilirubin: 0.9 mg/dL (ref 0.2–1.2)
Total Protein: 7 g/dL (ref 6.1–8.1)

## 2019-12-30 LAB — CBC
HCT: 45 % (ref 38.5–50.0)
Hemoglobin: 15.6 g/dL (ref 13.2–17.1)
MCH: 32.4 pg (ref 27.0–33.0)
MCHC: 34.7 g/dL (ref 32.0–36.0)
MCV: 93.4 fL (ref 80.0–100.0)
MPV: 10.7 fL (ref 7.5–12.5)
Platelets: 217 10*3/uL (ref 140–400)
RBC: 4.82 10*6/uL (ref 4.20–5.80)
RDW: 11.9 % (ref 11.0–15.0)
WBC: 6.7 10*3/uL (ref 3.8–10.8)

## 2019-12-30 LAB — TSH: TSH: 1.22 mIU/L (ref 0.40–4.50)

## 2019-12-30 LAB — LIPID PANEL
Cholesterol: 122 mg/dL (ref <200)
HDL: 35 mg/dL — ABNORMAL LOW (ref >=40)
LDL Cholesterol (Calc): 67 mg/dL (calc)
Non-HDL Cholesterol (Calc): 87 mg/dL (calc) (ref <130)
Total CHOL/HDL Ratio: 3.5 (calc) (ref <5.0)
Triglycerides: 114 mg/dL (ref <150)

## 2019-12-30 LAB — PSA: PSA: 0.62 ng/mL (ref <=4.0)

## 2019-12-31 ENCOUNTER — Other Ambulatory Visit: Payer: Self-pay

## 2019-12-31 ENCOUNTER — Telehealth: Payer: Self-pay | Admitting: Cardiology

## 2019-12-31 ENCOUNTER — Other Ambulatory Visit: Payer: Self-pay | Admitting: Family Medicine

## 2019-12-31 ENCOUNTER — Other Ambulatory Visit: Payer: Self-pay | Admitting: Cardiology

## 2019-12-31 MED ORDER — FLECAINIDE ACETATE 50 MG PO TABS
50.0000 mg | ORAL_TABLET | Freq: Two times a day (BID) | ORAL | 3 refills | Status: DC
Start: 2019-12-31 — End: 2020-12-25

## 2019-12-31 MED ORDER — LEVOTHYROXINE SODIUM 100 MCG PO TABS: 100.0000 ug | ORAL_TABLET | Freq: Every day | ORAL | 3 refills | Status: DC

## 2019-12-31 MED ORDER — METOPROLOL SUCCINATE ER 25 MG PO TB24
25.0000 mg | ORAL_TABLET | Freq: Every day | ORAL | 3 refills | Status: DC
Start: 2019-12-31 — End: 2020-12-25

## 2019-12-31 NOTE — Telephone Encounter (Signed)
*  STAT* If patient is at the pharmacy, call can be transferred to refill team.   1. Which medications need to be refilled? (please list name of each medication and dose if known) flecainide (TAMBOCOR) 50 MG tablet;   metoprolol succinate (TOPROL-XL) 25 MG 24 hr tablet     2. Which pharmacy/location (including street and city if local pharmacy) is medication to be sent to? [id number] - , [place] - [address]  3. Do they need a 30 day or 90 day supply? 90 day for both

## 2020-03-28 ENCOUNTER — Ambulatory Visit: Payer: Federal, State, Local not specified - PPO | Admitting: Cardiology

## 2020-06-14 ENCOUNTER — Encounter: Payer: Self-pay | Admitting: General Practice

## 2020-06-27 ENCOUNTER — Encounter: Payer: Self-pay | Admitting: Family Medicine

## 2020-06-27 ENCOUNTER — Other Ambulatory Visit: Payer: Self-pay

## 2020-06-27 ENCOUNTER — Ambulatory Visit: Payer: Federal, State, Local not specified - PPO | Admitting: Family Medicine

## 2020-06-27 DIAGNOSIS — I480 Paroxysmal atrial fibrillation: Secondary | ICD-10-CM

## 2020-06-27 DIAGNOSIS — E782 Mixed hyperlipidemia: Secondary | ICD-10-CM | POA: Diagnosis not present

## 2020-06-27 DIAGNOSIS — E039 Hypothyroidism, unspecified: Secondary | ICD-10-CM

## 2020-06-27 MED ORDER — ATORVASTATIN CALCIUM 20 MG PO TABS
20.0000 mg | ORAL_TABLET | Freq: Every day | ORAL | 1 refills | Status: DC
Start: 2020-06-27 — End: 2020-12-26

## 2020-06-27 NOTE — Assessment & Plan Note (Signed)
Well controlled with current medications.  Plans to discuss ablation with cardiologist at upcoming appt.

## 2020-06-27 NOTE — Progress Notes (Signed)
William Morton - 65 y.o. male MRN [id number]  Date of birth: October 26, 1955  Subjective Chief Complaint  Patient presents with  . Hypothyroid    HPI William Morton is a 65 y.o. male here today for follow up of Hypothyroidism and HLD.  He reports that he is doing well.  Exposed to COVID last week but has not developed any symptoms.    BP is managed with metoprolol which he is also using for rate control related to A. Fib.  He has f/u with his cardiologist next week and plans to discuss ablation.  Currently he is on flecainide.  He denies chest pain, shortness of breath or increased fatigue.    He feels good with current dose of levothyroxine.    No issues with atorvastatin including myalgias.  Lab Results  Component Value Date   LDLCALC 67 12/29/2019   ROS:  A comprehensive ROS was completed and negative except as noted per HPI  Allergies  Allergen Reactions  . Penicillins Swelling and Rash    Swelling in joints    Past Medical History:  Diagnosis Date  . Atrial fibrillation (HCC) 2017  . Hyperlipidemia   . OSA (obstructive sleep apnea)   . Thyroid disease   . Vitamin D deficiency     Past Surgical History:  Procedure Laterality Date  . CPAP TITRATION  09/22/2015  . EYE SURGERY    . TONSILLECTOMY      Social History   Socioeconomic History  . Marital status: Unknown    Spouse name: Not on file  . Number of children: Not on file  . Years of education: Not on file  . Highest education level: Not on file  Occupational History  . Occupation: OFFICER    Employer: [employer]  Tobacco Use  . Smoking status: Never Smoker  . Smokeless tobacco: Never Used  Substance and Sexual Activity  . Alcohol use: Yes  . Drug use: No  . Sexual activity: Not on file  Other Topics Concern  . Not on file  Social History Narrative  . Not on file   Social Determinants of Health   Financial Resource Strain: Not on file  Food Insecurity: Not on file  Transportation Needs: Not on file   Physical Activity: Not on file  Stress: Not on file  Social Connections: Not on file    Family History  Problem Relation Age of Onset  . Heart disease Mother   . Heart attack Mother   . Cancer Brother        throat CA  . Arrhythmia Father   . Cancer Father     Health Maintenance  Topic Date Due  . COVID-19 Vaccine (3 - Booster for Moderna series) 02/02/2020  . COLONOSCOPY (Pts 45-27yrs Insurance coverage will need to be confirmed)  04/15/2020  . TETANUS/TDAP  03/20/2027  . INFLUENZA VACCINE  Completed  . Hepatitis C Screening  Completed  . HIV Screening  Completed  . HPV VACCINES  Aged Out     ----------------------------------------------------------------------------------------------------------------------------------------------------------------------------------------------------------------- Physical Exam BP 127/71 (BP Location: Left Arm, Patient Position: Sitting, Cuff Size: Large)   Pulse 63   Temp 98 F (36.7 C)   Wt 228 lb 3.2 oz (103.5 kg)   SpO2 97%   BMI 30.91 kg/m   Physical Exam Constitutional:      Appearance: Normal appearance.  HENT:     Head: Normocephalic and atraumatic.  Eyes:     General: No scleral icterus. Cardiovascular:     Rate and  Rhythm: Normal rate and regular rhythm.  Pulmonary:     Effort: Pulmonary effort is normal.     Breath sounds: Normal breath sounds.  Musculoskeletal:     Cervical back: Neck supple.  Skin:    General: Skin is warm and dry.  Neurological:     General: No focal deficit present.     Mental Status: He is alert.  Psychiatric:        Mood and Affect: Mood normal.        Behavior: Behavior normal.     ------------------------------------------------------------------------------------------------------------------------------------------------------------------------------------------------------------------- Assessment and Plan  No problem-specific Assessment & Plan notes found for this  encounter.   Meds ordered this encounter  Medications  . atorvastatin (LIPITOR) 20 MG tablet    Sig: Take 1 tablet (20 mg total) by mouth daily.    Dispense:  90 tablet    Refill:  1    Return in about 6 months (around 12/28/2020) for Hypothyroid.    This visit occurred during the SARS-CoV-2 public health emergency.  Safety protocols were in place, including screening questions prior to the visit, additional usage of staff PPE, and extensive cleaning of exam room while observing appropriate contact time as indicated for disinfecting solutions.

## 2020-06-27 NOTE — Assessment & Plan Note (Signed)
Lab Results  Component Value Date   TSH 1.22 12/29/2019  Continue levothyroxine at current strength.

## 2020-06-27 NOTE — Assessment & Plan Note (Signed)
Doing well with atorvastatin, continue at current strength.  

## 2020-07-04 ENCOUNTER — Other Ambulatory Visit: Payer: Self-pay

## 2020-07-04 ENCOUNTER — Ambulatory Visit: Payer: Federal, State, Local not specified - PPO | Admitting: Cardiology

## 2020-07-04 ENCOUNTER — Encounter: Payer: Self-pay | Admitting: Cardiology

## 2020-07-04 VITALS — BP 118/74 | HR 66 | Ht 72.0 in | Wt 227.4 lb

## 2020-07-04 DIAGNOSIS — I480 Paroxysmal atrial fibrillation: Secondary | ICD-10-CM

## 2020-07-04 NOTE — Patient Instructions (Signed)
Medication Instructions:  Your physician recommends that you continue on your current medications as directed. Please refer to the Current Medication list given to you today.  *If you need a refill on your cardiac medications before your next appointment, please call your pharmacy*   Lab Work: None ordered  Testing/Procedures: None ordered   Follow-Up: At [facility], you and your health needs are our priority.  As part of our continuing mission to provide you with exceptional heart care, we have created designated Provider Care Teams.  These Care Teams include your primary Cardiologist (physician) and Advanced Practice Providers (APPs -  Physician Assistants and Nurse Practitioners) who all work together to provide you with the care you need, when you need it.   Your next appointment:   6 month(s)  The format for your next appointment:   In Person  Provider:   You may see one of the following Advanced Practice Providers on your designated Care Team:    Amber Seiler, NP  Renee Ursuy, PA-C  Michael "Andy" Tillery, PA-C     Thank you for choosing [facility]!!   Omare Bilotta, RN [phone]     

## 2020-07-04 NOTE — Progress Notes (Signed)
Electrophysiology Office Note   Date:  07/04/2020   ID:  William Morton, DOB [birth year], MRN [id number]  PCP:  Everrett Coombe, DO  Cardiologist:  [facility] Primary Electrophysiologist:  Will Jorja Loa, MD    Chief Complaint: AF   History of Present Illness: William Morton is a 65 y.o. male who is being seen today for the evaluation of AF at the request of Everrett Coombe, DO. Presenting today for electrophysiology evaluation.  He has a history significant for paroxysmal atrial fibrillation, hyperlipidemia, and sleep apnea.  He is currently on flecainide and metoprolol.  Today, denies symptoms of palpitations, chest pain, shortness of breath, orthopnea, PND, lower extremity edema, claudication, dizziness, presyncope, syncope, bleeding, or neurologic sequela. The patient is tolerating medications without difficulties.  Since last being seen he has done well.  He has no chest pain or shortness of breath.  He has noted no further episodes of atrial fibrillation.  He has had some fatigue.  He is taking his metoprolol in the middle of the day.  He also has been somewhat noncompliant with his CPAP.  Past Medical History:  Diagnosis Date  . Atrial fibrillation (HCC) 2017  . Hyperlipidemia   . OSA (obstructive sleep apnea)   . Thyroid disease   . Vitamin D deficiency    Past Surgical History:  Procedure Laterality Date  . CPAP TITRATION  09/22/2015  . EYE SURGERY    . TONSILLECTOMY       Current Outpatient Medications  Medication Sig Dispense Refill  . AMBULATORY NON FORMULARY MEDICATION Please supply APAP machine with humidfier, tubing, mask and filters Auto titration:  7-15cmH20 No EPR.  Mask: Per patient preference.  Previously Fitted for [employer] and [employer] Full Face Mask Send Compliance data after 30 days to Dr. Ashley Royalty at [phone] 1 Device 0  . atorvastatin (LIPITOR) 20 MG tablet Take 1 tablet (20 mg total) by mouth daily. 90 tablet 1  . cetirizine  (ZYRTEC) 10 MG tablet Take 10 mg by mouth daily.    . flecainide (TAMBOCOR) 50 MG tablet Take 1 tablet (50 mg total) by mouth 2 (two) times daily. 180 tablet 3  . levothyroxine (SYNTHROID) 100 MCG tablet Take 1 tablet (100 mcg total) by mouth daily. 90 tablet 3  . meloxicam (MOBIC) 15 MG tablet One tab PO qAM with a meal for 2 weeks, then daily prn pain. 30 tablet 3  . metoprolol succinate (TOPROL-XL) 25 MG 24 hr tablet Take 1 tablet (25 mg total) by mouth daily. 90 tablet 3  . nystatin-triamcinolone ointment (MYCOLOG) Apply 1 application topically 2 (two) times daily. Use for 7-10 days. 30 g 0   No current facility-administered medications for this visit.    Allergies:   Penicillins   Social History:  The patient  reports that he has never smoked. He has never used smokeless tobacco. He reports current alcohol use. He reports that he does not use drugs.   Family History:  The patient's family history includes Arrhythmia in his father; Cancer in his brother and father; Heart attack in his mother; Heart disease in his mother.   ROS:  Please see the history of present illness.   Otherwise, review of systems is positive for none.   All other systems are reviewed and negative.   PHYSICAL EXAM: VS:  BP 118/74   Pulse 66   Ht 6' (1.829 m)   Wt 227 lb 6.4 oz (103.1 kg)   SpO2 96%   BMI 30.84 kg/m  ,  BMI Body mass index is 30.84 kg/m. GEN: Well nourished, well developed, in no acute distress  HEENT: normal  Neck: no JVD, carotid bruits, or masses Cardiac: RRR; no murmurs, rubs, or gallops,no edema  Respiratory:  clear to auscultation bilaterally, normal work of breathing GI: soft, nontender, nondistended, + BS MS: no deformity or atrophy  Skin: warm and dry Neuro:  Strength and sensation are intact Psych: euthymic mood, full affect  EKG:  EKG is ordered today. Personal review of the ekg ordered shows sinus rhythm, rate 66  Recent Labs: 12/29/2019: ALT 16; BUN 13; Creat 0.98;  Hemoglobin 15.6; Platelets 217; Potassium 4.1; Sodium 139; TSH 1.22    Lipid Panel     Component Value Date/Time   CHOL 122 12/29/2019 1413   TRIG 114 12/29/2019 1413   HDL 35 (L) 12/29/2019 1413   CHOLHDL 3.5 12/29/2019 1413   VLDL 17 03/18/2016 0938   LDLCALC 67 12/29/2019 1413     Wt Readings from Last 3 Encounters:  07/04/20 227 lb 6.4 oz (103.1 kg)  06/27/20 228 lb 3.2 oz (103.5 kg)  12/29/19 228 lb 12.8 oz (103.8 kg)      Other studies Reviewed: Additional studies/ records that were reviewed today include: TTE 04/22/18  Review of the above records today demonstrates:  - Left ventricle: The cavity size was normal. Wall thickness was   increased in a pattern of mild LVH. Systolic function was normal.   The estimated ejection fraction was in the range of 60% to 65%.   Wall motion was normal; there were no regional wall motion   abnormalities. The study is indeterminate for the evaluation of   LV diastolic function. - Mitral valve: There was trivial regurgitation. - Left atrium: The atrium was mildly dilated. - Right ventricle: The cavity size was mildly dilated. Wall   thickness was normal. - Right atrium: The atrium was mildly dilated. - Atrial septum: No defect or patent foramen ovale was identified. - Tricuspid valve: There was trivial regurgitation. - Pulmonary arteries: Systolic pressure was within the normal   range.   ASSESSMENT AND PLAN:  1.  Paroxysmal atrial fibrillation: Currently on flecainide, Eliquis and metoprolol.  CHA2DS2-VASc of 0.  High risk medication monitoring.  He currently feels well.  He has not had any further episodes of atrial fibrillation.  He does have some fatigue.  I have told him to take his metoprolol right before he goes to sleep.   2.  Obstructive sleep apnea: He is not been super compliant with his CPAP.  This could be the cause of his fatigue.  CPAP compliance encouraged   Current medicines are reviewed at length with the  patient today.   The patient does not have concerns regarding his medicines.  The following changes were made today: None  Labs/ tests ordered today include:  Orders Placed This Encounter  Procedures  . EKG 12-Lead     Disposition:   FU with Will Camnitz 6 months  Signed, Will Jorja Loa, MD  07/04/2020 2:39 PM     [facility] [address] [place] [address] [phone] (office) [phone] (fax)

## 2020-07-25 ENCOUNTER — Telehealth (INDEPENDENT_AMBULATORY_CARE_PROVIDER_SITE_OTHER): Payer: Federal, State, Local not specified - PPO | Admitting: Family Medicine

## 2020-07-25 ENCOUNTER — Other Ambulatory Visit: Payer: Self-pay

## 2020-07-25 ENCOUNTER — Encounter: Payer: Self-pay | Admitting: Family Medicine

## 2020-07-25 DIAGNOSIS — J069 Acute upper respiratory infection, unspecified: Secondary | ICD-10-CM | POA: Diagnosis not present

## 2020-07-25 NOTE — Progress Notes (Signed)
Symptoms: fever, fatigue, sinus congestion, and headache.   Home test COVID: negative

## 2020-07-25 NOTE — Assessment & Plan Note (Signed)
Symptoms consistent with viral etiology.   He had negative covid test at home.  He will take a 2nd one today.   If he does test positive for COVID therapeutic options such as paxlovid are limited due to flecainide, but he may benefit from MAB infusion.   For now I recommended symptomatic and supportive care with increase fluids and rest.  He will contact our clinic if symptoms worsen.

## 2020-07-25 NOTE — Progress Notes (Signed)
William Morton - 65 y.o. male MRN [id number]  Date of birth: 21-May-1955   This visit type was conducted due to national recommendations for restrictions regarding the COVID-19 Pandemic (e.g. social distancing).  This format is felt to be most appropriate for this patient at this time.  All issues noted in this document were discussed and addressed.  No physical exam was performed (except for noted visual exam findings with Video Visits).  I discussed the limitations of evaluation and management by telemedicine and the availability of in person appointments. The patient expressed understanding and agreed to proceed.  I connected with@ on 07/25/20 at 10:50 AM EDT by a video enabled telemedicine application and verified that I am speaking with the correct person using two identifiers.  Present at visit: Everrett Coombe, DO Raymond Gurney   Patient Location: Home [address] [place] [address]   Provider location:   [facility]  No chief complaint on file.   HPI  William Morton is a 65 y.o. male who presents via audio/video conferencing for a telehealth visit today.  He has complaint of fever, chills, body aches, fatigue and sinus congestion.  Symptoms started yesterday.  Tmax: 100.8.  He denies nausea, vomiting, diarrhea, chest pain, shortness of breath.  He  Is taking zyrtec currently.  He did take a home COVID test which was negative.     ROS:  A comprehensive ROS was completed and negative except as noted per HPI  Past Medical History:  Diagnosis Date  . Atrial fibrillation (HCC) 2017  . Hyperlipidemia   . OSA (obstructive sleep apnea)   . Thyroid disease   . Vitamin D deficiency     Past Surgical History:  Procedure Laterality Date  . CPAP TITRATION  09/22/2015  . EYE SURGERY    . TONSILLECTOMY      Family History  Problem Relation Age of Onset  . Heart disease Mother   . Heart attack Mother   . Cancer Brother        throat CA  . Arrhythmia Father   . Cancer  Father     Social History   Socioeconomic History  . Marital status: Unknown    Spouse name: Not on file  . Number of children: Not on file  . Years of education: Not on file  . Highest education level: Not on file  Occupational History  . Occupation: OFFICER    Employer: [employer]  Tobacco Use  . Smoking status: Never Smoker  . Smokeless tobacco: Never Used  Substance and Sexual Activity  . Alcohol use: Yes  . Drug use: No  . Sexual activity: Not on file  Other Topics Concern  . Not on file  Social History Narrative  . Not on file   Social Determinants of Health   Financial Resource Strain: Not on file  Food Insecurity: Not on file  Transportation Needs: Not on file  Physical Activity: Not on file  Stress: Not on file  Social Connections: Not on file  Intimate Partner Violence: Not on file     Current Outpatient Medications:  .  AMBULATORY NON FORMULARY MEDICATION, Please supply APAP machine with humidfier, tubing, mask and filters Auto titration:  7-15cmH20 No EPR.  Mask: Per patient preference.  Previously Fitted for [employer] and [employer] Full Face Mask Send Compliance data after 30 days to Dr. Ashley Royalty at [phone], Disp: 1 Device, Rfl: 0 .  atorvastatin (LIPITOR) 20 MG tablet, Take 1 tablet (20 mg total) by mouth  daily., Disp: 90 tablet, Rfl: 1 .  cetirizine (ZYRTEC) 10 MG tablet, Take 10 mg by mouth daily., Disp: , Rfl:  .  flecainide (TAMBOCOR) 50 MG tablet, Take 1 tablet (50 mg total) by mouth 2 (two) times daily., Disp: 180 tablet, Rfl: 3 .  levothyroxine (SYNTHROID) 100 MCG tablet, Take 1 tablet (100 mcg total) by mouth daily., Disp: 90 tablet, Rfl: 3 .  meloxicam (MOBIC) 15 MG tablet, One tab PO qAM with a meal for 2 weeks, then daily prn pain., Disp: 30 tablet, Rfl: 3 .  nystatin-triamcinolone ointment (MYCOLOG), Apply 1 application topically 2 (two) times daily. Use for 7-10 days., Disp: 30 g, Rfl: 0 .  metoprolol succinate (TOPROL-XL) 25 MG 24 hr  tablet, Take 1 tablet (25 mg total) by mouth daily. (Patient not taking: Reported on 07/25/2020), Disp: 90 tablet, Rfl: 3  EXAM:  VITALS per patient if applicable: Pulse 68   Temp 100.3 F (37.9 C)   Ht 6' (1.829 m)   Wt 228 lb (103.4 kg)   SpO2 97%   BMI 30.92 kg/m   GENERAL: alert, oriented, appears well and in no acute distress  HEENT: atraumatic, conjunttiva clear, no obvious abnormalities on inspection of external nose and ears  NECK: normal movements of the head and neck  LUNGS: on inspection no signs of respiratory distress, breathing rate appears normal, no obvious gross SOB, gasping or wheezing  CV: no obvious cyanosis  MS: moves all visible extremities without noticeable abnormality  PSYCH/NEURO: pleasant and cooperative, no obvious depression or anxiety, speech and thought processing grossly intact  ASSESSMENT AND PLAN:  Discussed the following assessment and plan:  Viral URI Symptoms consistent with viral etiology.   He had negative covid test at home.  He will take a 2nd one today.   If he does test positive for COVID therapeutic options such as paxlovid are limited due to flecainide, but he may benefit from MAB infusion.   For now I recommended symptomatic and supportive care with increase fluids and rest.  He will contact our clinic if symptoms worsen.       I discussed the assessment and treatment plan with the patient. The patient was provided an opportunity to ask questions and all were answered. The patient agreed with the plan and demonstrated an understanding of the instructions.   The patient was advised to call back or seek an in-person evaluation if the symptoms worsen or if the condition fails to improve as anticipated.    Everrett Coombe, DO

## 2020-08-30 DIAGNOSIS — G4733 Obstructive sleep apnea (adult) (pediatric): Secondary | ICD-10-CM | POA: Diagnosis not present

## 2020-08-31 ENCOUNTER — Telehealth: Payer: Self-pay

## 2020-08-31 NOTE — Telephone Encounter (Signed)
Please call and schedule patient for appt between 09/30/20 & 11/28/20 for CPAP follow-up. Required per insurance.   Thanks

## 2020-09-01 NOTE — Telephone Encounter (Signed)
Appointment has been scheduled for 10/12/20. AM

## 2020-09-30 DIAGNOSIS — G4733 Obstructive sleep apnea (adult) (pediatric): Secondary | ICD-10-CM | POA: Diagnosis not present

## 2020-10-12 ENCOUNTER — Encounter: Payer: Self-pay | Admitting: Family Medicine

## 2020-10-12 ENCOUNTER — Ambulatory Visit: Payer: Federal, State, Local not specified - PPO | Admitting: Family Medicine

## 2020-10-12 ENCOUNTER — Other Ambulatory Visit: Payer: Self-pay

## 2020-10-12 DIAGNOSIS — Z9989 Dependence on other enabling machines and devices: Secondary | ICD-10-CM

## 2020-10-12 DIAGNOSIS — G4733 Obstructive sleep apnea (adult) (pediatric): Secondary | ICD-10-CM | POA: Diagnosis not present

## 2020-10-12 NOTE — Assessment & Plan Note (Signed)
Doing well with current CPAP.  Will request compliance data from DME company.

## 2020-10-12 NOTE — Progress Notes (Signed)
William Morton - 65 y.o. male MRN [id number]  Date of birth: [birth year]  Subjective No chief complaint on file.   HPI William Morton is a 65 y.o. male here today for follow up of OSA.  He received new CPAP machine and is following up for compliance of CPAP.  He is using 4+ hours most of the time.  Feels good with current settings.  Good mask fit and feels well rested in the mornings.   ROS:  A comprehensive ROS was completed and negative except as noted per HPI  Allergies  Allergen Reactions   Penicillins Swelling and Rash    Swelling in joints    Past Medical History:  Diagnosis Date   Atrial fibrillation (HCC) 2017   Hyperlipidemia    OSA (obstructive sleep apnea)    Thyroid disease    Vitamin D deficiency     Past Surgical History:  Procedure Laterality Date   CPAP TITRATION  09/22/2015   EYE SURGERY     TONSILLECTOMY      Social History   Socioeconomic History   Marital status: Unknown    Spouse name: Not on file   Number of children: Not on file   Years of education: Not on file   Highest education level: Not on file  Occupational History   Occupation: OFFICER    Employer: [employer]  Tobacco Use   Smoking status: Never   Smokeless tobacco: Never  Substance and Sexual Activity   Alcohol use: Yes   Drug use: No   Sexual activity: Not on file  Other Topics Concern   Not on file  Social History Narrative   Not on file   Social Determinants of Health   Financial Resource Strain: Not on file  Food Insecurity: Not on file  Transportation Needs: Not on file  Physical Activity: Not on file  Stress: Not on file  Social Connections: Not on file    Family History  Problem Relation Age of Onset   Heart disease Mother    Heart attack Mother    Cancer Brother        throat CA   Arrhythmia Father    Cancer Father     Health Maintenance  Topic Date Due   Zoster Vaccines- Shingrix (1 of 2) Never done   COVID-19 Vaccine (3 - Booster for Moderna series)  01/03/2020   COLONOSCOPY (Pts 45-20yrs Insurance coverage will need to be confirmed)  04/15/2020   INFLUENZA VACCINE  11/13/2020   TETANUS/TDAP  03/20/2027   Hepatitis C Screening  Completed   HIV Screening  Completed   Pneumococcal Vaccine 61-46 Years old  Aged Out   HPV VACCINES  Aged Out     ----------------------------------------------------------------------------------------------------------------------------------------------------------------------------------------------------------------- Physical Exam BP 116/72   Pulse 76   Ht 6' (1.829 m)   Wt 223 lb (101.2 kg)   BMI 30.24 kg/m   Physical Exam Constitutional:      Appearance: Normal appearance.  HENT:     Head: Normocephalic and atraumatic.  Eyes:     General: No scleral icterus. Cardiovascular:     Rate and Rhythm: Normal rate and regular rhythm.  Pulmonary:     Effort: Pulmonary effort is normal.     Breath sounds: Normal breath sounds.  Musculoskeletal:     Cervical back: Neck supple.  Neurological:     General: No focal deficit present.     Mental Status: He is alert.  Psychiatric:        Mood and Affect:  Mood normal.        Behavior: Behavior normal.    ------------------------------------------------------------------------------------------------------------------------------------------------------------------------------------------------------------------- Assessment and Plan  No problem-specific Assessment & Plan notes found for this encounter.   No orders of the defined types were placed in this encounter.   No follow-ups on file.    This visit occurred during the SARS-CoV-2 public health emergency.  Safety protocols were in place, including screening questions prior to the visit, additional usage of staff PPE, and extensive cleaning of exam room while observing appropriate contact time as indicated for disinfecting solutions.

## 2020-10-30 DIAGNOSIS — G4733 Obstructive sleep apnea (adult) (pediatric): Secondary | ICD-10-CM | POA: Diagnosis not present

## 2020-11-30 DIAGNOSIS — G4733 Obstructive sleep apnea (adult) (pediatric): Secondary | ICD-10-CM | POA: Diagnosis not present

## 2020-12-24 ENCOUNTER — Other Ambulatory Visit: Payer: Self-pay | Admitting: Cardiology

## 2020-12-24 ENCOUNTER — Other Ambulatory Visit: Payer: Self-pay | Admitting: Family Medicine

## 2020-12-28 ENCOUNTER — Encounter: Payer: Self-pay | Admitting: Family Medicine

## 2020-12-28 ENCOUNTER — Other Ambulatory Visit: Payer: Self-pay

## 2020-12-28 ENCOUNTER — Ambulatory Visit (INDEPENDENT_AMBULATORY_CARE_PROVIDER_SITE_OTHER): Payer: Federal, State, Local not specified - PPO | Admitting: Family Medicine

## 2020-12-28 VITALS — BP 131/61 | HR 57 | Temp 98.0°F | Ht 72.0 in | Wt 222.0 lb

## 2020-12-28 DIAGNOSIS — E559 Vitamin D deficiency, unspecified: Secondary | ICD-10-CM | POA: Diagnosis not present

## 2020-12-28 DIAGNOSIS — Z23 Encounter for immunization: Secondary | ICD-10-CM | POA: Diagnosis not present

## 2020-12-28 DIAGNOSIS — I480 Paroxysmal atrial fibrillation: Secondary | ICD-10-CM | POA: Diagnosis not present

## 2020-12-28 DIAGNOSIS — E782 Mixed hyperlipidemia: Secondary | ICD-10-CM | POA: Diagnosis not present

## 2020-12-28 DIAGNOSIS — E039 Hypothyroidism, unspecified: Secondary | ICD-10-CM | POA: Diagnosis not present

## 2020-12-28 NOTE — Assessment & Plan Note (Signed)
Updating TSH today. 

## 2020-12-28 NOTE — Assessment & Plan Note (Signed)
Medications managed by cardiology.  Will complete FMLA paperwork for him.

## 2020-12-28 NOTE — Assessment & Plan Note (Signed)
Continues to do well with atorvastatin.  Update lipid panel. 

## 2020-12-28 NOTE — Patient Instructions (Signed)
Great to see you today! We'll be in touch with lab results and recommendations.  

## 2020-12-28 NOTE — Progress Notes (Signed)
William Morton - 65 y.o. male MRN [id number]  Date of birth: 1955/11/22  Subjective Chief Complaint  Patient presents with   Follow-up    HPI William Morton is a 65 year old male here today for follow-up visit.  Reports that overall he is doing well.  He does continue to see cardiology for management of atrial fibrillation.  He does have intermittent episodes related to his atrial fibrillation where he misses work.  This occurs every couple of months and typically only lasts a couple of days.  He would like FMLA paperwork completed for this.  His hypertension is managed with metoprolol.  Blood pressures remain well controlled with this.  He denies chest pain, shortness of breath, palpitations, headache or vision changes.  He feels good at current dose of levothyroxine.  No symptoms of hypo or hyperthyroidism at this time.  ROS:  A comprehensive ROS was completed and negative except as noted per HPI  Allergies  Allergen Reactions   Penicillins Swelling and Rash    Swelling in joints    Past Medical History:  Diagnosis Date   Atrial fibrillation (HCC) 2017   Hyperlipidemia    OSA (obstructive sleep apnea)    Thyroid disease    Vitamin D deficiency     Past Surgical History:  Procedure Laterality Date   CPAP TITRATION  09/22/2015   EYE SURGERY     TONSILLECTOMY      Social History   Socioeconomic History   Marital status: Unknown    Spouse name: Not on file   Number of children: Not on file   Years of education: Not on file   Highest education level: Not on file  Occupational History   Occupation: OFFICER    Employer: [employer]  Tobacco Use   Smoking status: Never   Smokeless tobacco: Never  Substance and Sexual Activity   Alcohol use: Yes   Drug use: No   Sexual activity: Not on file  Other Topics Concern   Not on file  Social History Narrative   Not on file   Social Determinants of Health   Financial Resource Strain: Not on file  Food Insecurity: Not on file   Transportation Needs: Not on file  Physical Activity: Not on file  Stress: Not on file  Social Connections: Not on file    Family History  Problem Relation Age of Onset   Heart disease Mother    Heart attack Mother    Cancer Brother        throat CA   Arrhythmia Father    Cancer Father     Health Maintenance  Topic Date Due   COVID-19 Vaccine (3 - Booster for Moderna series) 01/03/2020   COLONOSCOPY (Pts 45-54yrs Insurance coverage will need to be confirmed)  04/15/2020   Zoster Vaccines- Shingrix (2 of 2) 02/22/2021   TETANUS/TDAP  03/20/2027   INFLUENZA VACCINE  Completed   Hepatitis C Screening  Completed   HIV Screening  Completed   Pneumococcal Vaccine 41-45 Years old  Aged Out   HPV VACCINES  Aged Out     ----------------------------------------------------------------------------------------------------------------------------------------------------------------------------------------------------------------- Physical Exam BP 131/61 (BP Location: Left Arm, Patient Position: Sitting, Cuff Size: Normal)   Pulse (!) 57   Temp 98 F (36.7 C)   Ht 6' (1.829 m)   Wt 222 lb (100.7 kg)   SpO2 97%   BMI 30.11 kg/m   Physical Exam Constitutional:      Appearance: Normal appearance.  HENT:     Head: Normocephalic and  atraumatic.  Eyes:     General: No scleral icterus. Cardiovascular:     Rate and Rhythm: Normal rate and regular rhythm.  Pulmonary:     Effort: Pulmonary effort is normal.     Breath sounds: Normal breath sounds.  Musculoskeletal:     Cervical back: Neck supple.  Neurological:     General: No focal deficit present.     Mental Status: He is alert.  Psychiatric:        Mood and Affect: Mood normal.        Behavior: Behavior normal.     ------------------------------------------------------------------------------------------------------------------------------------------------------------------------------------------------------------------- Assessment and Plan  Hypothyroid Updating TSH today.  Atrial fibrillation (HCC) Medications managed by cardiology.  Will complete FMLA paperwork for him.  HLD (hyperlipidemia) Continues to do well with atorvastatin.  Update lipid panel.   No orders of the defined types were placed in this encounter.   Return in about 6 months (around 06/27/2021) for Thyroid/HTN.    This visit occurred during the SARS-CoV-2 public health emergency.  Safety protocols were in place, including screening questions prior to the visit, additional usage of staff PPE, and extensive cleaning of exam room while observing appropriate contact time as indicated for disinfecting solutions.

## 2020-12-29 ENCOUNTER — Telehealth: Payer: Self-pay

## 2020-12-29 LAB — COMPLETE METABOLIC PANEL WITH GFR
AG Ratio: 1.8 (calc) (ref 1.0–2.5)
ALT: 19 U/L (ref 9–46)
AST: 21 U/L (ref 10–35)
Albumin: 4.6 g/dL (ref 3.6–5.1)
Alkaline phosphatase (APISO): 64 U/L (ref 35–144)
BUN: 14 mg/dL (ref 7–25)
CO2: 26 mmol/L (ref 20–32)
Calcium: 9.7 mg/dL (ref 8.6–10.3)
Chloride: 104 mmol/L (ref 98–110)
Creat: 1.01 mg/dL (ref 0.70–1.35)
Globulin: 2.5 g/dL (calc) (ref 1.9–3.7)
Glucose, Bld: 81 mg/dL (ref 65–99)
Potassium: 4.2 mmol/L (ref 3.5–5.3)
Sodium: 139 mmol/L (ref 135–146)
Total Bilirubin: 0.9 mg/dL (ref 0.2–1.2)
Total Protein: 7.1 g/dL (ref 6.1–8.1)
eGFR: 84 mL/min/{1.73_m2} (ref >=60)

## 2020-12-29 LAB — LIPID PANEL W/REFLEX DIRECT LDL
Cholesterol: 113 mg/dL (ref <200)
HDL: 38 mg/dL — ABNORMAL LOW (ref >=40)
LDL Cholesterol (Calc): 58 mg/dL (calc)
Non-HDL Cholesterol (Calc): 75 mg/dL (calc) (ref <130)
Total CHOL/HDL Ratio: 3 (calc) (ref <5.0)
Triglycerides: 88 mg/dL (ref <150)

## 2020-12-29 LAB — CBC WITH DIFFERENTIAL/PLATELET
Absolute Monocytes: 725 cells/uL (ref 200–950)
Basophils Absolute: 69 cells/uL (ref 0–200)
Basophils Relative: 1 %
Eosinophils Absolute: 400 cells/uL (ref 15–500)
Eosinophils Relative: 5.8 %
HCT: 45.1 % (ref 38.5–50.0)
Hemoglobin: 15.5 g/dL (ref 13.2–17.1)
Lymphs Abs: 2346 cells/uL (ref 850–3900)
MCH: 32.6 pg (ref 27.0–33.0)
MCHC: 34.4 g/dL (ref 32.0–36.0)
MCV: 94.9 fL (ref 80.0–100.0)
MPV: 10.6 fL (ref 7.5–12.5)
Monocytes Relative: 10.5 %
Neutro Abs: 3360 cells/uL (ref 1500–7800)
Neutrophils Relative %: 48.7 %
Platelets: 202 10*3/uL (ref 140–400)
RBC: 4.75 10*6/uL (ref 4.20–5.80)
RDW: 12 % (ref 11.0–15.0)
Total Lymphocyte: 34 %
WBC: 6.9 10*3/uL (ref 3.8–10.8)

## 2020-12-29 LAB — TSH: TSH: 1.32 mIU/L (ref 0.40–4.50)

## 2020-12-29 LAB — VITAMIN D 25 HYDROXY (VIT D DEFICIENCY, FRACTURES): Vit D, 25-Hydroxy: 28 ng/mL — ABNORMAL LOW (ref 30–100)

## 2020-12-29 NOTE — Telephone Encounter (Signed)
Patient notified paperwork is up front and ready to be picked up and will pick paperwork up in a little bit he said. AM

## 2020-12-29 NOTE — Telephone Encounter (Signed)
FMLA documentation completed.   Front Desk:  Original - call patient for pick-up Copy - send to scan  Thanks

## 2020-12-31 DIAGNOSIS — G4733 Obstructive sleep apnea (adult) (pediatric): Secondary | ICD-10-CM | POA: Diagnosis not present

## 2021-01-04 ENCOUNTER — Ambulatory Visit: Payer: Federal, State, Local not specified - PPO | Admitting: Student

## 2021-01-15 ENCOUNTER — Telehealth: Payer: Federal, State, Local not specified - PPO | Admitting: Emergency Medicine

## 2021-01-15 DIAGNOSIS — U071 COVID-19: Secondary | ICD-10-CM

## 2021-01-15 MED ORDER — BENZONATATE 100 MG PO CAPS: 100.0000 mg | ORAL_CAPSULE | Freq: Two times a day (BID) | ORAL | 0 refills | Status: DC | PRN

## 2021-01-15 MED ORDER — MOLNUPIRAVIR EUA 200MG CAPSULE: 4.0000 | ORAL_CAPSULE | Freq: Two times a day (BID) | ORAL | 0 refills | Status: AC

## 2021-01-15 NOTE — Progress Notes (Signed)
Virtual Visit Consent   [name], you are scheduled for a virtual visit with a [facility] provider today.     Just as with appointments in the office, your consent must be obtained to participate.  Your consent will be active for this visit and any virtual visit you may have with one of our providers in the next 365 days.     If you have a MyChart account, a copy of this consent can be sent to you electronically.  All virtual visits are billed to your insurance company just like a traditional visit in the office.    As this is a virtual visit, video technology does not allow for your provider to perform a traditional examination.  This may limit your provider's ability to fully assess your condition.  If your provider identifies any concerns that need to be evaluated in person or the need to arrange testing (such as labs, EKG, etc.), we will make arrangements to do so.     Although advances in technology are sophisticated, we cannot ensure that it will always work on either your end or our end.  If the connection with a video visit is poor, the visit may have to be switched to a telephone visit.  With either a video or telephone visit, we are not always able to ensure that we have a secure connection.     I need to obtain your verbal consent now.   Are you willing to proceed with your visit today?    Kyser Wandel has provided verbal consent on 01/15/2021 for a virtual visit telephone. Started as video, but audio failed on the patient's end and was converted to telephone.   Roxy Horseman, PA-C   Date: 01/15/2021 8:03 AM   Virtual Visit via Video Note   I, Roxy Horseman, connected with  William Morton  ([id number], 63-17-1957) on 01/15/21 at  8:00 AM EDT by a video-enabled telemedicine application and verified that I am speaking with the correct person using two identifiers.  Location: Patient: Virtual Visit Location Patient: Home Provider: Virtual Visit Location Provider:  Home Office   I discussed the limitations of evaluation and management by telemedicine and the availability of in person appointments. The patient expressed understanding and agreed to proceed.    History of Present Illness: William Morton is a 65 y.o. who identifies as a male who was assigned male at birth, and is being seen today for COVID 63.  He states that he had the positive test yesterday.  Started feeling bad 2 days ago.  Is vaccinated against COVID.  Denies having had COVID before.  Reports fever to 101.7.  Reports associated headache and fatigue.  Reports that his oxygen levels have been 97-98%.  Denies SOB or CP.  Reports slight cough.  HPI: HPI  Problems:  Patient Active Problem List   Diagnosis Date Noted   Well adult exam 12/29/2019   Balanitis 08/26/2019   Rotator cuff syndrome, right 06/18/2019   Flexural atopic dermatitis 06/18/2019   Asymptomatic microscopic hematuria 10/13/2015   OSA on CPAP 08/22/2015   Lesion of penis 08/01/2015   Anxiety state 08/01/2015   H/O cataract extraction 06/13/2015   Regular astigmatism of right eye 06/12/2015   Nuclear sclerosis, right 06/11/2015   Atrial fibrillation (HCC)    Fatigue    PSC (posterior subcapsular cataract), bilateral 04/04/2015   Vitamin D deficiency 03/17/2015   HLD (hyperlipidemia) 03/16/2015   Hypothyroid 03/16/2015   Lumbar degenerative disc disease 09/21/2014  Allergies:  Allergies  Allergen Reactions   Penicillins Swelling and Rash    Swelling in joints   Medications:  Current Outpatient Medications:    AMBULATORY NON FORMULARY MEDICATION, Please supply APAP machine with humidfier, tubing, mask and filters Auto titration:  7-15cmH20 No EPR.  Mask: Per patient preference.  Previously Fitted for [employer] and [employer] Full Face Mask Send Compliance data after 30 days to Dr. Ashley Royalty at [phone], Disp: 1 Device, Rfl: 0   atorvastatin (LIPITOR) 20 MG tablet, TAKE 1 TABLET BY MOUTH EVERY DAY,  Disp: 30 tablet, Rfl: 0   cetirizine (ZYRTEC) 10 MG tablet, Take 10 mg by mouth daily., Disp: , Rfl:    flecainide (TAMBOCOR) 50 MG tablet, TAKE 1 TABLET BY MOUTH TWICE A DAY, Disp: 180 tablet, Rfl: 1   levothyroxine (SYNTHROID) 100 MCG tablet, TAKE 1 TABLET BY MOUTH EVERY DAY, Disp: 30 tablet, Rfl: 0   meloxicam (MOBIC) 15 MG tablet, One tab PO qAM with a meal for 2 weeks, then daily prn pain., Disp: 30 tablet, Rfl: 3   metoprolol succinate (TOPROL-XL) 25 MG 24 hr tablet, TAKE 1 TABLET BY MOUTH EVERY DAY, Disp: 90 tablet, Rfl: 1   nystatin-triamcinolone ointment (MYCOLOG), Apply 1 application topically 2 (two) times daily. Use for 7-10 days., Disp: 30 g, Rfl: 0  Observations/Objective: Patient is well-developed, well-nourished in no acute distress.  Resting comfortably at home.  Head is normocephalic, atraumatic.  No labored breathing.  Speech is clear and coherent with logical content.  Patient is alert and oriented at baseline.    Assessment and Plan: 1. COVID-19 -Molnupiravir -Tessalon perles  Follow Up Instructions: I discussed the assessment and treatment plan with the patient. The patient was provided an opportunity to ask questions and all were answered. The patient agreed with the plan and demonstrated an understanding of the instructions.  A copy of instructions were sent to the patient via MyChart unless otherwise noted below.     The patient was advised to call back or seek an in-person evaluation if the symptoms worsen or if the condition fails to improve as anticipated.  Time:  I spent 10 minutes with the patient via telehealth technology discussing the above problems/concerns.    Roxy Horseman, PA-C

## 2021-01-18 ENCOUNTER — Telehealth: Payer: Self-pay

## 2021-01-18 ENCOUNTER — Ambulatory Visit: Payer: Federal, State, Local not specified - PPO | Admitting: Physician Assistant

## 2021-01-18 NOTE — Telephone Encounter (Signed)
Pt lvm stating he is COVID + and was prescribed antiviral meds. Due to the meds being on Emergency Use Authorization he is leary of taking the meds. He's already started taking the medications without any side effects.   Returned patient's call. Advised, per Dr. Ashley Royalty, to take the medication. The meds have been in use prior to COVID. Currently under Emergency Use due to not being used for this particular purpose.   Patient expressed understanding and gratitude for the information. He will continue treatment.

## 2021-01-22 ENCOUNTER — Encounter: Payer: Self-pay | Admitting: Family Medicine

## 2021-01-23 ENCOUNTER — Other Ambulatory Visit: Payer: Self-pay | Admitting: Family Medicine

## 2021-01-30 DIAGNOSIS — G4733 Obstructive sleep apnea (adult) (pediatric): Secondary | ICD-10-CM | POA: Diagnosis not present

## 2021-02-10 NOTE — Progress Notes (Signed)
Cardiology Office Note Date:  02/12/2021  Patient ID:  William Morton, [employer] 07/13/1955, MRN [id number] PCP:  Everrett Coombe, DO  Cardiologist:  Dr. Rennis Golden Electrophysiologist: Dr. Elberta Fortis    Chief Complaint:  flecainide visit  History of Present Illness: William Morton is a 65 y.o. male with history of OSA w/CPAP, HLD, AFib, hypothyroidism.  He comes today to be seen for Dr. Elberta Fortis, last seen by him 07/04/20, at that time no symptoms of AFib though feeling fatigued.  Recommended moving his metoprolol to the evenings to see if this helped his fatigue. No other changes were made.  TODAY He is doing well [place] [employer] and is walking all day long. He remains quite happy with his AFib control Last weekend he had about an hour of Afib, this the 1st in several months. He feels poorly in AFib,but otherwise when not in AFib, no CP, SOB, feels like he has very good exertional capacity and tolerance No near syncope or syncope  His PMD monitors and manages his lipids/labs    AFib hx Diagnosed Jan 2017  AAD Hx Flecainide started 2018 w/pill/pocket strategy >> daily 2020  Past Medical History:  Diagnosis Date   Atrial fibrillation (HCC) 2017   Hyperlipidemia    OSA (obstructive sleep apnea)    Thyroid disease    Vitamin D deficiency     Past Surgical History:  Procedure Laterality Date   CPAP TITRATION  09/22/2015   EYE SURGERY     TONSILLECTOMY      Current Outpatient Medications  Medication Sig Dispense Refill   AMBULATORY NON FORMULARY MEDICATION Please supply APAP machine with humidfier, tubing, mask and filters Auto titration:  7-15cmH20 No EPR.  Mask: Per patient preference.  Previously Fitted for [employer] and [employer] Full Face Mask Send Compliance data after 30 days to Dr. Ashley Royalty at [phone] 1 Device 0   atorvastatin (LIPITOR) 20 MG tablet Take 1 tablet (20 mg total) by mouth daily. 90 tablet 3   benzonatate (TESSALON) 100 MG  capsule Take 1 capsule (100 mg total) by mouth 2 (two) times daily as needed for cough. 20 capsule 0   cetirizine (ZYRTEC) 10 MG tablet Take 10 mg by mouth daily.     flecainide (TAMBOCOR) 50 MG tablet TAKE 1 TABLET BY MOUTH TWICE A DAY 180 tablet 1   levothyroxine (SYNTHROID) 100 MCG tablet Take 1 tablet (100 mcg total) by mouth daily. 90 tablet 1   meloxicam (MOBIC) 15 MG tablet Take 15 mg by mouth as needed for pain.     metoprolol succinate (TOPROL-XL) 25 MG 24 hr tablet TAKE 1 TABLET BY MOUTH EVERY DAY 90 tablet 1   nystatin-triamcinolone ointment (MYCOLOG) Apply 1 application topically as needed (for skin).     No current facility-administered medications for this visit.    Allergies:   Penicillins   Social History:  The patient  reports that he has never smoked. He has never used smokeless tobacco. He reports current alcohol use. He reports that he does not use drugs.   Family History:  The patient's family history includes Arrhythmia in his father; Cancer in his brother and father; Heart attack in his mother; Heart disease in his mother.  ROS:  Please see the history of present illness.    All other systems are reviewed and otherwise negative.   PHYSICAL EXAM:  VS:  BP 100/62   Pulse 71   Ht 6' (1.829 m)   Wt 218 lb  12.8 oz (99.2 kg)   SpO2 96%   BMI 29.67 kg/m  BMI: Body mass index is 29.67 kg/m. Well nourished, well developed, in no acute distress HEENT: normocephalic, atraumatic Neck: no JVD, carotid bruits or masses Cardiac:  RRR; no significant murmurs, no rubs, or gallops Lungs:  CTA b/l, no wheezing, rhonchi or rales Abd: soft, nontender MS: no deformity or atrophy Ext:  no edema Skin: warm and dry, no rash Neuro:  No gross deficits appreciated Psych: euthymic mood, full affect    EKG:  Done today and reviewed by myself shows  SR 71bpm, PR , QRS , QTc , stable intervals   04/22/2018: TTE Study Conclusions  - Left ventricle: The cavity size  was normal. Wall thickness was    increased in a pattern of mild LVH. Systolic function was normal.    The estimated ejection fraction was in the range of 60% to 65%.    Wall motion was normal; there were no regional wall motion    abnormalities. The study is indeterminate for the evaluation of    LV diastolic function.  - Mitral valve: There was trivial regurgitation.  - Left atrium: The atrium was mildly dilated.  - Right ventricle: The cavity size was mildly dilated. Wall    thickness was normal.  - Right atrium: The atrium was mildly dilated.  - Atrial septum: No defect or patent foramen ovale was identified.  - Tricuspid valve: There was trivial regurgitation.  - Pulmonary arteries: Systolic pressure was within the normal    range.   Impressions:   - Normal LV EF. Mild biatrial enlargement, mild RV enlargement.   04/09/2018: EST Non-diagnostic for ischemia due to baseline ST changes Rhythm rapid afib 2 mm ST depression with stress  Poor resting HR control 111 bpm No QRS widening with stress   Recent Labs: 12/28/2020: ALT 19; BUN 14; Creat 1.01; Hemoglobin 15.5; Platelets 202; Potassium 4.2; Sodium 139; TSH 1.32  12/28/2020: Cholesterol 113; HDL 38; LDL Cholesterol (Calc) 58; Total CHOL/HDL Ratio 3.0; Triglycerides 88   CrCl cannot be calculated (Patient's most recent lab result is older than the maximum 21 days allowed.).   Wt Readings from Last 3 Encounters:  02/12/21 218 lb 12.8 oz (99.2 kg)  12/28/20 222 lb (100.7 kg)  10/12/20 223 lb (101.2 kg)     Other studies reviewed: Additional studies/records reviewed today include: summarized above  ASSESSMENT AND PLAN:  Paroxysmal Afib CHA2DS2Vasc is zero, not on a/c flecainide and metoprolol, stable intervals low burden by symptoms  We discussed general cardiac health, healthy living habits, sounds like he does a good job, could lose a few pounds  Disposition: F/u with [place] in 83mo, sooner if needed  Current  medicines are reviewed at length with the patient today.  The patient did not have any concerns regarding medicines.  Norma Fredrickson, PA-C 02/12/2021 2:29 PM     [facility] [address] [place] [address] [phone] (office)  [phone] (fax)

## 2021-02-12 ENCOUNTER — Ambulatory Visit: Payer: Federal, State, Local not specified - PPO | Admitting: Physician Assistant

## 2021-02-12 ENCOUNTER — Other Ambulatory Visit: Payer: Self-pay

## 2021-02-12 ENCOUNTER — Encounter: Payer: Self-pay | Admitting: Physician Assistant

## 2021-02-12 VITALS — BP 100/62 | HR 71 | Ht 72.0 in | Wt 218.8 lb

## 2021-02-12 DIAGNOSIS — Z5181 Encounter for therapeutic drug level monitoring: Secondary | ICD-10-CM

## 2021-02-12 DIAGNOSIS — Z79899 Other long term (current) drug therapy: Secondary | ICD-10-CM

## 2021-02-12 DIAGNOSIS — I480 Paroxysmal atrial fibrillation: Secondary | ICD-10-CM | POA: Diagnosis not present

## 2021-02-12 NOTE — Patient Instructions (Signed)

## 2021-03-02 DIAGNOSIS — G4733 Obstructive sleep apnea (adult) (pediatric): Secondary | ICD-10-CM | POA: Diagnosis not present

## 2021-04-01 DIAGNOSIS — G4733 Obstructive sleep apnea (adult) (pediatric): Secondary | ICD-10-CM | POA: Diagnosis not present

## 2021-05-01 DIAGNOSIS — G4733 Obstructive sleep apnea (adult) (pediatric): Secondary | ICD-10-CM | POA: Diagnosis not present

## 2021-05-02 DIAGNOSIS — G4733 Obstructive sleep apnea (adult) (pediatric): Secondary | ICD-10-CM | POA: Diagnosis not present

## 2021-06-01 DIAGNOSIS — G4733 Obstructive sleep apnea (adult) (pediatric): Secondary | ICD-10-CM | POA: Diagnosis not present

## 2021-06-27 ENCOUNTER — Encounter: Payer: Self-pay | Admitting: Family Medicine

## 2021-06-27 ENCOUNTER — Other Ambulatory Visit: Payer: Self-pay

## 2021-06-27 ENCOUNTER — Ambulatory Visit: Payer: Federal, State, Local not specified - PPO | Admitting: Family Medicine

## 2021-06-27 DIAGNOSIS — E782 Mixed hyperlipidemia: Secondary | ICD-10-CM | POA: Diagnosis not present

## 2021-06-27 DIAGNOSIS — I480 Paroxysmal atrial fibrillation: Secondary | ICD-10-CM | POA: Diagnosis not present

## 2021-06-27 DIAGNOSIS — E039 Hypothyroidism, unspecified: Secondary | ICD-10-CM

## 2021-06-27 NOTE — Assessment & Plan Note (Signed)
Doing well on current dose of levothyroxine.  Continue current strength. ?

## 2021-06-27 NOTE — Assessment & Plan Note (Signed)
Continues to tolerate atorvastatin well at current strength. ?

## 2021-06-27 NOTE — Assessment & Plan Note (Signed)
Management per cardiology.  Stable at this time with current medications. 

## 2021-06-27 NOTE — Progress Notes (Signed)
?William Morton - 66 y.o. male MRN [id number]  Date of birth: 1955/08/03 ? ?Subjective ?Chief Complaint  ?Patient presents with  ? Hypertension  ? Thyroid Problem  ? ? ?HPI ?[name] is a 66 year old male here today for follow-up visit.  Reports overall he is doing well.  No new concerns at this time. ? ?Continues to see cardiology for management of atrial fibrillation.  This is managed blood pressure remains well controlled with metoprolol and flecainide.  He denies any dizziness, chest pain or shortness of breath. ? ?He feels good with current dose of levothyroxine.  TSH levels have been stable. ? ?Tolerating atorvastatin well. ? ?ROS:  A comprehensive ROS was completed and negative except as noted per HPI ? ?Allergies  ?Allergen Reactions  ? Penicillins Swelling and Rash  ?  Swelling in joints  ? ? ?Past Medical History:  ?Diagnosis Date  ? Atrial fibrillation (HCC) 2017  ? Hyperlipidemia   ? OSA (obstructive sleep apnea)   ? Thyroid disease   ? Vitamin D deficiency   ? ? ?Past Surgical History:  ?Procedure Laterality Date  ? CPAP TITRATION  09/22/2015  ? EYE SURGERY    ? TONSILLECTOMY    ? ? ?Social History  ? ?Socioeconomic History  ? Marital status: Unknown  ?  Spouse name: Not on file  ? Number of children: Not on file  ? Years of education: Not on file  ? Highest education level: Not on file  ?Occupational History  ? Occupation: OFFICER  ?  Employer: [employer]  ?Tobacco Use  ? Smoking status: Never  ? Smokeless tobacco: Never  ?Substance and Sexual Activity  ? Alcohol use: Yes  ? Drug use: No  ? Sexual activity: Not on file  ?Other Topics Concern  ? Not on file  ?Social History Narrative  ? Not on file  ? ?Social Determinants of Health  ? ?Financial Resource Strain: Not on file  ?Food Insecurity: Not on file  ?Transportation Needs: Not on file  ?Physical Activity: Not on file  ?Stress: Not on file  ?Social Connections: Not on file  ? ? ?Family History  ?Problem Relation Age of Onset  ? Heart disease Mother   ? Heart  attack Mother   ? Cancer Brother   ?     throat CA  ? Arrhythmia Father   ? Cancer Father   ? ? ?Health Maintenance  ?Topic Date Due  ? COVID-19 Vaccine (3 - Moderna risk series) 08/31/2019  ? COLONOSCOPY (Pts 45-74yrs Insurance coverage will need to be confirmed)  04/15/2020  ? Zoster Vaccines- Shingrix (2 of 2) 02/22/2021  ? TETANUS/TDAP  03/20/2027  ? INFLUENZA VACCINE  Completed  ? Hepatitis C Screening  Completed  ? HIV Screening  Completed  ? HPV VACCINES  Aged Out  ? ? ? ?----------------------------------------------------------------------------------------------------------------------------------------------------------------------------------------------------------------- ?Physical Exam ?BP 118/66 (BP Location: Left Arm, Patient Position: Sitting, Cuff Size: Normal)   Pulse (!) 53   Ht 6' (1.829 m)   Wt 220 lb (99.8 kg)   SpO2 95%   BMI 29.84 kg/m?  ? ?Physical Exam ?Constitutional:   ?   Appearance: Normal appearance.  ?Eyes:  ?   General: No scleral icterus. ?Cardiovascular:  ?   Rate and Rhythm: Normal rate and regular rhythm.  ?Pulmonary:  ?   Effort: Pulmonary effort is normal.  ?   Breath sounds: Normal breath sounds.  ?Musculoskeletal:  ?   Cervical back: Neck supple.  ?Neurological:  ?   Mental Status: He  is alert.  ?Psychiatric:     ?   Mood and Affect: Mood normal.     ?   Behavior: Behavior normal.  ? ? ?------------------------------------------------------------------------------------------------------------------------------------------------------------------------------------------------------------------- ?Assessment and Plan ? ?Atrial fibrillation (HCC) ?Management per cardiology.  Stable at this time with current medications. ? ?Hypothyroid ?Doing well on current dose of levothyroxine.  Continue current strength. ? ?HLD (hyperlipidemia) ?Continues to tolerate atorvastatin well at current strength. ? ? ?No orders of the defined types were placed in this encounter. ? ? ?Return in  about 6 months (around 12/28/2021) for Annual exam/Fasting labs. ? ? ? ?This visit occurred during the SARS-CoV-2 public health emergency.  Safety protocols were in place, including screening questions prior to the visit, additional usage of staff PPE, and extensive cleaning of exam room while observing appropriate contact time as indicated for disinfecting solutions.  ? ?

## 2021-06-29 DIAGNOSIS — G4733 Obstructive sleep apnea (adult) (pediatric): Secondary | ICD-10-CM | POA: Diagnosis not present

## 2021-07-26 ENCOUNTER — Other Ambulatory Visit: Payer: Self-pay | Admitting: Family Medicine

## 2021-08-15 DIAGNOSIS — G4733 Obstructive sleep apnea (adult) (pediatric): Secondary | ICD-10-CM | POA: Diagnosis not present

## 2021-09-15 DIAGNOSIS — G4733 Obstructive sleep apnea (adult) (pediatric): Secondary | ICD-10-CM | POA: Diagnosis not present

## 2021-09-17 ENCOUNTER — Emergency Department (HOSPITAL_BASED_OUTPATIENT_CLINIC_OR_DEPARTMENT_OTHER): Payer: Federal, State, Local not specified - PPO

## 2021-09-17 ENCOUNTER — Emergency Department (HOSPITAL_BASED_OUTPATIENT_CLINIC_OR_DEPARTMENT_OTHER)
Admission: EM | Admit: 2021-09-17 | Discharge: 2021-09-17 | Disposition: A | Discharge status: Home / Self Care | Payer: Federal, State, Local not specified - PPO | Attending: Emergency Medicine | Admitting: Emergency Medicine

## 2021-09-17 ENCOUNTER — Emergency Department: Admission: EM | Admit: 2021-09-17 | Discharge: 2021-09-17 | Discharge status: Absent without leave | Payer: Self-pay

## 2021-09-17 ENCOUNTER — Other Ambulatory Visit: Payer: Self-pay

## 2021-09-17 DIAGNOSIS — W19XXXA Unspecified fall, initial encounter: Secondary | ICD-10-CM

## 2021-09-17 DIAGNOSIS — S60511A Abrasion of right hand, initial encounter: Secondary | ICD-10-CM | POA: Diagnosis not present

## 2021-09-17 DIAGNOSIS — S60222A Contusion of left hand, initial encounter: Secondary | ICD-10-CM | POA: Diagnosis not present

## 2021-09-17 DIAGNOSIS — Y93K1 Activity, walking an animal: Secondary | ICD-10-CM | POA: Diagnosis not present

## 2021-09-17 DIAGNOSIS — W01198A Fall on same level from slipping, tripping and stumbling with subsequent striking against other object, initial encounter: Secondary | ICD-10-CM | POA: Diagnosis not present

## 2021-09-17 DIAGNOSIS — S6992XA Unspecified injury of left wrist, hand and finger(s), initial encounter: Secondary | ICD-10-CM | POA: Diagnosis not present

## 2021-09-17 DIAGNOSIS — S0990XA Unspecified injury of head, initial encounter: Secondary | ICD-10-CM | POA: Insufficient documentation

## 2021-09-17 DIAGNOSIS — S8002XA Contusion of left knee, initial encounter: Secondary | ICD-10-CM | POA: Diagnosis not present

## 2021-09-17 DIAGNOSIS — Y92480 Sidewalk as the place of occurrence of the external cause: Secondary | ICD-10-CM | POA: Insufficient documentation

## 2021-09-17 DIAGNOSIS — M79641 Pain in right hand: Secondary | ICD-10-CM | POA: Diagnosis not present

## 2021-09-17 DIAGNOSIS — M7989 Other specified soft tissue disorders: Secondary | ICD-10-CM | POA: Diagnosis not present

## 2021-09-17 DIAGNOSIS — M79642 Pain in left hand: Secondary | ICD-10-CM | POA: Diagnosis not present

## 2021-09-17 NOTE — ED Notes (Signed)
Patient ambulatory to treatment room 

## 2021-09-17 NOTE — ED Triage Notes (Signed)
Reported falling and hitting head against the pavement while playing with 75 lbs dog; c/o left hand and knee and concern for concussion; denies LOC; and blood thinners.

## 2021-09-17 NOTE — Discharge Instructions (Addendum)
You were seen in the emergency room today after a fall with head injury.  Your CT scan did not show any broken bones or bleeding.  We discussed taking Tylenol and elevation/ice for the swollen/tender areas.  Please follow with your primary care doctor.  I listed the name of a neurologist on this form.  If you develop any concussion symptoms you might consider seeing them.  You can discuss further with your primary care doctor.

## 2021-09-17 NOTE — ED Provider Notes (Signed)
Emergency Department Provider Note   I have reviewed the triage vital signs and the nursing notes.   HISTORY  Chief Complaint Fall   HPI William Morton is a 66 y.o. male presents to the ED after falling while walking his large dog. He reports the dog suddenly pulled and he lost his footing, falling to the ground. He notes pain in multiple areas along with head injury and abrasions. No numbness. No LOC.    Past Medical History:  Diagnosis Date   Atrial fibrillation (HCC) 2017   Hyperlipidemia    OSA (obstructive sleep apnea)    Thyroid disease    Vitamin D deficiency     Review of Systems  Constitutional: No fever/chills Eyes: No visual changes. ENT: No sore throat. Cardiovascular: Denies chest pain. Respiratory: Denies shortness of breath. Gastrointestinal: No abdominal pain.  No nausea, no vomiting.  No diarrhea.  No constipation. Genitourinary: Negative for dysuria. Musculoskeletal: Negative for back pain. Positive left knee and bilateral hand pain.  Skin: Abrasion to the left shoulder, hands, left face, and bruising to the left knee.  Neurological: Negative for focal weakness or numbness. Positive HA.    ____________________________________________   PHYSICAL EXAM:  VITAL SIGNS: ED Triage Vitals  Enc Vitals Group     BP 09/17/21 1917 (!) 105/39     Pulse Rate 09/17/21 1917 62     Resp 09/17/21 1917 20     Temp 09/17/21 1917 98.5 F (36.9 C)     Temp Source 09/17/21 1917 Oral     SpO2 09/17/21 1917 100 %     Weight 09/17/21 1916 220 lb (99.8 kg)     Height 09/17/21 1916 6' (1.829 m)   Constitutional: Alert and oriented. Well appearing and in no acute distress. Eyes: Conjunctivae are normal. PERRL. Head: Abrasion to the left lateral eyebrow without laceration. No hematoma.  Nose: No congestion/rhinnorhea. Mouth/Throat: Mucous membranes are moist.   Neck: No stridor. No cervical spine tenderness to palpation. Cardiovascular: Normal rate, regular  rhythm. Good peripheral circulation. Grossly normal heart sounds.   Respiratory: Normal respiratory effort.  No retractions. Lungs CTAB. Gastrointestinal: Soft and nontender. No distention.  Musculoskeletal: Decreased ROM of the left ring finger without bruising or laceration. Pain in palms of the bilateral hands. No scaphoid tenderness bilaterally. No medial knee tenderness or bruising to correlate with imaging.  Neurologic:  Normal speech and language. No gross focal neurologic deficits are appreciated.  Skin:  Skin is warm and dry. Abrasions to the face and left posterior shoulder. No laceration. Bruising to the left superior and lateral knee. No skin changes medially.   ____________________________________________  RADIOLOGY  CT Head Wo Contrast  Result Date: 09/17/2021 CLINICAL DATA:  Head trauma, minor, normal mental status (Age 44-64y) EXAM: CT HEAD WITHOUT CONTRAST TECHNIQUE: Contiguous axial images were obtained from the base of the skull through the vertex without intravenous contrast. RADIATION DOSE REDUCTION: This exam was performed according to the departmental dose-optimization program which includes automated exposure control, adjustment of the mA and/or kV according to patient size and/or use of iterative reconstruction technique. COMPARISON:  None Available. FINDINGS: Brain: No evidence of acute infarction, hemorrhage, hydrocephalus, extra-axial collection or mass lesion/mass effect. Cavum septum pellucidum. Mild age related cerebral volume loss. Vascular: No hyperdense vessel or unexpected calcification. Skull: Normal. Negative for fracture or focal lesion. Sinuses/Orbits: No acute finding. Other: None. IMPRESSION: No acute intracranial abnormality. Electronically Signed   By: Duanne Guess D.O.   On: 09/17/2021 19:37  DG Knee Complete 4 Views Left  Result Date: 09/17/2021 CLINICAL DATA:  Fall EXAM: LEFT KNEE - COMPLETE 4+ VIEW COMPARISON:  None Available. FINDINGS: Minimal  articular surface irregularity at the medial proximal tibia. Soft tissue swelling on the medial side of the knee. No sizable effusion. No dislocation. IMPRESSION: Question subtle peripheral articular surface fracture deformity of the proximal medial tibia. If clinical suspicion for fracture is high, consider correlation with CT. Electronically Signed   By: Jasmine Pang M.D.   On: 09/17/2021 19:59   DG Hand Complete Left  Result Date: 09/17/2021 CLINICAL DATA:  Fall.  Hand pain. EXAM: LEFT HAND - COMPLETE 3+ VIEW COMPARISON:  None Available. FINDINGS: Mild interphalangeal articular space narrowing compatible with mild osteoarthritis. Mild spurring at the first carpometacarpal articulation. No fracture or acute bony findings identified. IMPRESSION: 1. No fracture or acute bony findings of the left hand. 2. 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 Electronically Signed   By: Gaylyn Rong M.D.   On: 09/17/2021 20:00   DG Hand Complete Right  Result Date: 09/17/2021 CLINICAL DATA:  Fall, hand pain. EXAM: RIGHT HAND - COMPLETE 3+ VIEW COMPARISON:  None Available. FINDINGS: Mild interphalangeal articular space narrowing. Off axis lateral view attempt reducing diagnostic sensitivity and specificity. No fracture or acute bony findings identified. IMPRESSION: Negative. Electronically Signed   By: Gaylyn Rong M.D.   On: 09/17/2021 20:01    ____________________________________________   PROCEDURES  Procedure(s) performed:   Procedures  None ____________________________________________   INITIAL IMPRESSION / ASSESSMENT AND PLAN / ED COURSE  Pertinent labs & imaging results that were available during my care of the patient were reviewed by me and considered in my medical decision making (see chart for details).   This patient is Presenting for Evaluation of head injury, which does require a range of treatment options, and is a complaint that involves a high  risk of morbidity and mortality.  The Differential Diagnoses includes subdural hematoma, epidural hematoma, acute concussion, traumatic subarachnoid hemorrhage, cerebral contusions, etc.   I did obtain Additional Historical Information from wife at bedside.   Radiologic Tests Ordered, included CT head, hand x-rays, and knee x-ray. I independently interpreted the images and agree with radiology interpretation.    Medical Decision Making: Summary:  Patient with mechanical fall and pain. No fracture or dislocation on plain films. Question area to the medial left knee but normal ROM, normal gait, and no point tenderness. No advanced knee imaging planned.   Reevaluation with update and discussion with patient and wife. Discussed follow up plan and mgmt of symptoms at home. Discussed ED return precautions.   Disposition: discharge  ____________________________________________  FINAL CLINICAL IMPRESSION(S) / ED DIAGNOSES  Final diagnoses:  Injury of head, initial encounter  Fall, initial encounter  Contusion of left knee, initial encounter  Contusion of left hand, initial encounter  Abrasion of palm of right hand, initial encounter    Note:  This document was prepared using Dragon voice recognition software and may include unintentional dictation errors.  Alona Bene, MD, [facility] Emergency Medicine    Vincie Linn, Arlyss Repress, MD 09/18/21 [phone]

## 2021-09-20 ENCOUNTER — Encounter: Payer: Self-pay | Admitting: Family Medicine

## 2021-09-20 ENCOUNTER — Ambulatory Visit (INDEPENDENT_AMBULATORY_CARE_PROVIDER_SITE_OTHER): Payer: Federal, State, Local not specified - PPO

## 2021-09-20 ENCOUNTER — Ambulatory Visit: Payer: Federal, State, Local not specified - PPO | Admitting: Family Medicine

## 2021-09-20 VITALS — BP 117/75 | HR 59 | Ht 72.0 in | Wt 220.0 lb

## 2021-09-20 DIAGNOSIS — R0789 Other chest pain: Secondary | ICD-10-CM | POA: Insufficient documentation

## 2021-09-20 DIAGNOSIS — R0781 Pleurodynia: Secondary | ICD-10-CM

## 2021-09-20 DIAGNOSIS — W19XXXA Unspecified fall, initial encounter: Secondary | ICD-10-CM | POA: Diagnosis not present

## 2021-09-20 MED ORDER — TIZANIDINE HCL 4 MG PO TABS: 4.0000 mg | ORAL_TABLET | Freq: Four times a day (QID) | ORAL | 0 refills | Status: DC | PRN

## 2021-09-20 NOTE — Assessment & Plan Note (Signed)
He does have quite a bit of tenderness along the left lower ribs.  X-rays of chest with rib detail on the left side ordered.  He does have meloxicam at home which I recommended he try.  Additionally we will add tizanidine

## 2021-09-20 NOTE — Patient Instructions (Signed)
Try meloxicam and tizanidine as needed.  We'll be in touch with rib xray results.

## 2021-09-20 NOTE — Progress Notes (Signed)
William Morton - 66 y.o. male MRN [id number]  Date of birth: 01/28/1956  Subjective Chief Complaint  Patient presents with   Fall    HPI William Morton is a 66 year old male here today for follow-up of recent fall.  Reports that his dog pulled him over and he landed on his hand, left side and shoulder and did hit his head.  He was seen in the emergency room afterwards.  He did have a CT scan of his head which was negative.  He also had x-rays of his hand and knee.  Which were negative.  He did not have any significant pain elsewhere but the following day he did develop pain in his left ribs.  This is worse with movement.  He denies shortness of breath.  He did try some ibuprofen which helped some.  ROS:  A comprehensive ROS was completed and negative except as noted per HPI  Allergies  Allergen Reactions   Penicillins Swelling and Rash    Swelling in joints    Past Medical History:  Diagnosis Date   Atrial fibrillation (HCC) 2017   Hyperlipidemia    OSA (obstructive sleep apnea)    Thyroid disease    Vitamin D deficiency     Past Surgical History:  Procedure Laterality Date   CPAP TITRATION  09/22/2015   EYE SURGERY     TONSILLECTOMY      Social History   Socioeconomic History   Marital status: Unknown    Spouse name: Not on file   Number of children: Not on file   Years of education: Not on file   Highest education level: Not on file  Occupational History   Occupation: OFFICER    Employer: [employer]  Tobacco Use   Smoking status: Never   Smokeless tobacco: Never  Substance and Sexual Activity   Alcohol use: Yes   Drug use: No   Sexual activity: Not on file  Other Topics Concern   Not on file  Social History Narrative   Not on file   Social Determinants of Health   Financial Resource Strain: Not on file  Food Insecurity: Not on file  Transportation Needs: Not on file  Physical Activity: Not on file  Stress: Not on file  Social Connections: Not on file    Family  History  Problem Relation Age of Onset   Heart disease Mother    Heart attack Mother    Cancer Brother        throat CA   Arrhythmia Father    Cancer Father     Health Maintenance  Topic Date Due   COVID-19 Vaccine (3 - Moderna risk series) 12/20/2021 (Originally 08/31/2019)   Zoster Vaccines- Shingrix (2 of 2) 12/21/2021 (Originally 02/22/2021)   COLONOSCOPY (Pts 45-43yrs Insurance coverage will need to be confirmed)  09/21/2022 (Originally 04/15/2020)   INFLUENZA VACCINE  11/13/2021   TETANUS/TDAP  03/20/2027   Hepatitis C Screening  Completed   HIV Screening  Completed   HPV VACCINES  Aged Out     ----------------------------------------------------------------------------------------------------------------------------------------------------------------------------------------------------------------- Physical Exam BP 117/75 (BP Location: Right Arm, Patient Position: Sitting, Cuff Size: Large)   Pulse (!) 59   Ht 6' (1.829 m)   Wt 220 lb (99.8 kg)   SpO2 95%   BMI 29.84 kg/m   Physical Exam Constitutional:      Appearance: Normal appearance.  Eyes:     General: No scleral icterus. Cardiovascular:     Rate and Rhythm: Normal rate and regular rhythm.  Pulmonary:     Effort: Pulmonary effort is normal.     Breath sounds: Normal breath sounds.  Chest:     Chest wall: Tenderness (Tenderness palpation along the left lower ribs.) present.  Musculoskeletal:     Cervical back: Neck supple.  Neurological:     General: No focal deficit present.     Mental Status: He is alert.  Psychiatric:        Mood and Affect: Mood normal.        Behavior: Behavior normal.     ------------------------------------------------------------------------------------------------------------------------------------------------------------------------------------------------------------------- Assessment and Plan  Rib pain He does have quite a bit of tenderness along the left lower ribs.   X-rays of chest with rib detail on the left side ordered.  He does have meloxicam at home which I recommended he try.  Additionally we will add tizanidine   Meds ordered this encounter  Medications   tiZANidine (ZANAFLEX) 4 MG tablet    Sig: Take 1 tablet (4 mg total) by mouth every 6 (six) hours as needed for muscle spasms.    Dispense:  30 tablet    Refill:  0    No follow-ups on file.    This visit occurred during the SARS-CoV-2 public health emergency.  Safety protocols were in place, including screening questions prior to the visit, additional usage of staff PPE, and extensive cleaning of exam room while observing appropriate contact time as indicated for disinfecting solutions.

## 2021-10-08 DIAGNOSIS — Z0279 Encounter for issue of other medical certificate: Secondary | ICD-10-CM

## 2021-10-09 ENCOUNTER — Ambulatory Visit (INDEPENDENT_AMBULATORY_CARE_PROVIDER_SITE_OTHER): Payer: Federal, State, Local not specified - PPO

## 2021-10-09 ENCOUNTER — Ambulatory Visit: Payer: Federal, State, Local not specified - PPO | Admitting: Cardiology

## 2021-10-09 ENCOUNTER — Encounter: Payer: Self-pay | Admitting: Cardiology

## 2021-10-09 VITALS — BP 108/64 | HR 99 | Ht 72.0 in | Wt 222.0 lb

## 2021-10-09 DIAGNOSIS — I480 Paroxysmal atrial fibrillation: Secondary | ICD-10-CM | POA: Diagnosis not present

## 2021-10-09 DIAGNOSIS — I493 Ventricular premature depolarization: Secondary | ICD-10-CM

## 2021-10-11 DIAGNOSIS — I480 Paroxysmal atrial fibrillation: Secondary | ICD-10-CM

## 2021-10-11 DIAGNOSIS — I493 Ventricular premature depolarization: Secondary | ICD-10-CM

## 2021-10-15 DIAGNOSIS — G4733 Obstructive sleep apnea (adult) (pediatric): Secondary | ICD-10-CM | POA: Diagnosis not present

## 2021-10-22 ENCOUNTER — Telehealth: Payer: Self-pay | Admitting: Cardiology

## 2021-10-22 NOTE — Telephone Encounter (Signed)
FMLA papers has been completed Called th pt to come pick up Papers left behind the front desk.

## 2021-10-30 ENCOUNTER — Telehealth: Payer: Self-pay | Admitting: Cardiology

## 2021-10-30 NOTE — Telephone Encounter (Signed)
New Message:     [place] calling to report abnormal EKG

## 2021-10-30 NOTE — Telephone Encounter (Signed)
[employer] called this results from patient's zio patch. Patient wore monitor for 11 days. Patient had A. FIB, see Strip # 4, patient had 1 minute at 180 bpm. A. FIB burden is 3%. Patient had one 3.6 second pause during the 11 days. Will forward to Dr. Elberta Fortis so he is aware of end service.

## 2021-11-14 DIAGNOSIS — G4733 Obstructive sleep apnea (adult) (pediatric): Secondary | ICD-10-CM | POA: Diagnosis not present

## 2021-11-15 ENCOUNTER — Telehealth: Payer: Self-pay | Admitting: Cardiology

## 2021-11-15 ENCOUNTER — Encounter: Payer: Self-pay | Admitting: Cardiology

## 2021-11-15 ENCOUNTER — Other Ambulatory Visit: Payer: Self-pay | Admitting: Cardiology

## 2021-11-15 ENCOUNTER — Other Ambulatory Visit: Payer: Self-pay

## 2021-11-15 MED ORDER — FLECAINIDE ACETATE 50 MG PO TABS: 50.0000 mg | ORAL_TABLET | Freq: Two times a day (BID) | ORAL | 3 refills | Status: DC

## 2021-11-15 MED ORDER — FLECAINIDE ACETATE 100 MG PO TABS: 100.0000 mg | ORAL_TABLET | Freq: Two times a day (BID) | ORAL | 3 refills | Status: DC

## 2021-11-15 NOTE — Telephone Encounter (Signed)
Follow Up:     Patient said someone had called on Tuesday, but could not remember who it was. He said it was about his Monitor results and medicine.

## 2021-11-15 NOTE — Telephone Encounter (Signed)
Returned call to pt. Results reviewed. Instructed to increase Flecainide to 100 mg BID d/t elevated PVC burden. Pt agreeable. Scheduled for follow up EKG nurse visit next Friday. Patient verbalized understanding and agreeable to plan.

## 2021-11-15 NOTE — Addendum Note (Signed)
Addended by: Baird Lyons on: 11/15/2021 06:13 PM   Modules accepted: Orders

## 2021-11-15 NOTE — Telephone Encounter (Signed)
med

## 2021-11-19 DIAGNOSIS — G4733 Obstructive sleep apnea (adult) (pediatric): Secondary | ICD-10-CM | POA: Diagnosis not present

## 2021-11-23 ENCOUNTER — Ambulatory Visit (INDEPENDENT_AMBULATORY_CARE_PROVIDER_SITE_OTHER): Payer: Federal, State, Local not specified - PPO

## 2021-11-23 VITALS — BP 120/72 | HR 59 | Ht 72.0 in | Wt 220.2 lb

## 2021-11-23 DIAGNOSIS — I493 Ventricular premature depolarization: Secondary | ICD-10-CM

## 2021-11-23 DIAGNOSIS — I480 Paroxysmal atrial fibrillation: Secondary | ICD-10-CM | POA: Diagnosis not present

## 2021-11-23 NOTE — Progress Notes (Unsigned)
   Nurse Visit   Date of Encounter: 11/23/2021 ID: William Morton, DOB 07/24/1955, MRN [id number]  PCP:  Everrett Coombe, DO   [facility] Providers Cardiologist:  Chrystie Nose, MD Electrophysiologist:  Regan Lemming, MD { Click to update primary MD,subspecialty MD or APP then REFRESH:1}     Visit Details   VS:  BP 120/72   Pulse (!) 59   Ht 6' (1.829 m)   Wt 220 lb 3.2 oz (99.9 kg)   BMI 29.86 kg/m  , BMI Body mass index is 29.86 kg/m.  Wt Readings from Last 3 Encounters:  11/23/21 220 lb 3.2 oz (99.9 kg)  10/09/21 222 lb (100.7 kg)  09/20/21 220 lb (99.8 kg)     Reason for visit: EKG for Flecainide Performed today: Vital signs, EKG, perovided education and consulted with Dr Eldridge Dace, DOD Changes (medications, testing, etc.) : no changes Length of Visit: 30 minutes    Medications Adjustments/Labs and Tests Ordered: Pt present today for EKG for Flecainide.  Pt states he is tolerating medication without problems other than some mild fatigue.  Dr Eldridge Dace has reviewed the EKG and recommends continue current medications and scheduled follow up.  Pt verbalizes understanding and is agreeable with current plan.    Signed, Alois Cliche, RN  11/23/2021 12:04 PM

## 2021-11-23 NOTE — Patient Instructions (Signed)
Medication Instructions:  Your physician recommends that you continue on your current medications as directed. Please refer to the Current Medication list given to you today.  *If you need a refill on your cardiac medications before your next appointment, please call your pharmacy*   Lab Work: None ordered.  If you have labs (blood work) drawn today and your tests are completely normal, you will receive your results only by: MyChart Message (if you have MyChart) OR A paper copy in the mail If you have any lab test that is abnormal or we need to change your treatment, we will call you to review the results.   Testing/Procedures: None ordered.    Follow-Up: At [facility], you and your health needs are our priority.  As part of our continuing mission to provide you with exceptional heart care, we have created designated Provider Care Teams.  These Care Teams include your primary Cardiologist (physician) and Advanced Practice Providers (APPs -  Physician Assistants and Nurse Practitioners) who all work together to provide you with the care you need, when you need it.  We recommend signing up for the patient portal called "MyChart".  Sign up information is provided on this After Visit Summary.  MyChart is used to connect with patients for Virtual Visits (Telemedicine).  Patients are able to view lab/test results, encounter notes, upcoming appointments, etc.  Non-urgent messages can be sent to your provider as well.   To learn more about what you can do with MyChart, go to [email or web address].    Your next appointment:   Follow up with Dr Elberta Fortis as scheduled  Important Information About Sugar

## 2021-12-28 ENCOUNTER — Encounter: Payer: Self-pay | Admitting: Family Medicine

## 2021-12-28 ENCOUNTER — Ambulatory Visit (INDEPENDENT_AMBULATORY_CARE_PROVIDER_SITE_OTHER): Payer: Federal, State, Local not specified - PPO | Admitting: Family Medicine

## 2021-12-28 VITALS — BP 114/63 | HR 54 | Ht 72.0 in | Wt 223.0 lb

## 2021-12-28 DIAGNOSIS — E039 Hypothyroidism, unspecified: Secondary | ICD-10-CM | POA: Diagnosis not present

## 2021-12-28 DIAGNOSIS — Z125 Encounter for screening for malignant neoplasm of prostate: Secondary | ICD-10-CM

## 2021-12-28 DIAGNOSIS — E559 Vitamin D deficiency, unspecified: Secondary | ICD-10-CM | POA: Diagnosis not present

## 2021-12-28 DIAGNOSIS — E782 Mixed hyperlipidemia: Secondary | ICD-10-CM

## 2021-12-28 DIAGNOSIS — Z23 Encounter for immunization: Secondary | ICD-10-CM

## 2021-12-28 DIAGNOSIS — Z0000 Encounter for general adult medical examination without abnormal findings: Secondary | ICD-10-CM | POA: Diagnosis not present

## 2021-12-28 NOTE — Assessment & Plan Note (Addendum)
Well adult Orders Placed This Encounter  Procedures  . COMPLETE METABOLIC PANEL WITH GFR  . CBC with Differential  . Lipid Panel w/reflex Direct LDL  . TSH  . PSA  . Vitamin D (25 hydroxy)  Screenings: Per lab orders Immunizations:  Flu vaccine given.  Shingrix #2- Anticipatory guidance/Risk factor reduction:  Recommendations per AVS.

## 2021-12-28 NOTE — Patient Instructions (Signed)

## 2021-12-28 NOTE — Progress Notes (Signed)
William Morton - 66 y.o. male MRN [id number]  Date of birth: 1955/08/06  Subjective No chief complaint on file.   HPI William Morton is a 66 y.o. male here today for annual exam.    Reports that he is doing well at this time.  No new concerns today.  Continues to see cardiology for history of PAF.  Stable at this time.    He is moderately active.  He feels like diet is pretty good.   He is a non-smoker  Denies significant EtOH use.   He is due for Shingrix #2 and Flu vaccine.   Review of Systems  Constitutional:  Negative for chills, fever, malaise/fatigue and weight loss.  HENT:  Negative for congestion, ear pain and sore throat.   Eyes:  Negative for blurred vision, double vision and pain.  Respiratory:  Negative for cough and shortness of breath.   Cardiovascular:  Negative for chest pain and palpitations.  Gastrointestinal:  Negative for abdominal pain, blood in stool, constipation, heartburn and nausea.  Genitourinary:  Negative for dysuria and urgency.  Musculoskeletal:  Negative for joint pain and myalgias.  Neurological:  Negative for dizziness and headaches.  Endo/Heme/Allergies:  Does not bruise/bleed easily.  Psychiatric/Behavioral:  Negative for depression. The patient is not nervous/anxious and does not have insomnia.       Allergies  Allergen Reactions   Penicillins Swelling and Rash    Swelling in joints    Past Medical History:  Diagnosis Date   Atrial fibrillation (HCC) 2017   Hyperlipidemia    OSA (obstructive sleep apnea)    Thyroid disease    Vitamin D deficiency     Past Surgical History:  Procedure Laterality Date   CPAP TITRATION  09/22/2015   EYE SURGERY     TONSILLECTOMY      Social History   Socioeconomic History   Marital status: Unknown    Spouse name: Not on file   Number of children: Not on file   Years of education: Not on file   Highest education level: Not on file  Occupational History   Occupation: OFFICER     Employer: [employer]  Tobacco Use   Smoking status: Never   Smokeless tobacco: Never  Substance and Sexual Activity   Alcohol use: Yes   Drug use: No   Sexual activity: Not on file  Other Topics Concern   Not on file  Social History Narrative   Not on file   Social Determinants of Health   Financial Resource Strain: Not on file  Food Insecurity: Not on file  Transportation Needs: Not on file  Physical Activity: Not on file  Stress: Not on file  Social Connections: Not on file    Family History  Problem Relation Age of Onset   Heart disease Mother    Heart attack Mother    Cancer Brother        throat CA   Arrhythmia Father    Cancer Father     Health Maintenance  Topic Date Due   COVID-19 Vaccine (3 - Moderna risk series) 08/31/2019   Zoster Vaccines- Shingrix (2 of 2) 02/22/2021   INFLUENZA VACCINE  11/13/2021   COLONOSCOPY (Pts 45-44yrs Insurance coverage will need to be confirmed)  09/21/2022 (Originally 04/15/2020)   TETANUS/TDAP  03/20/2027   Hepatitis C Screening  Completed   HIV Screening  Completed   HPV VACCINES  Aged Out     ----------------------------------------------------------------------------------------------------------------------------------------------------------------------------------------------------------------- Physical Exam BP 114/63 (BP Location: Right Arm, Patient  Position: Sitting, Cuff Size: Large)   Pulse (!) 54   Ht 6' (1.829 m)   Wt 223 lb (101.2 kg)   SpO2 96%   BMI 30.24 kg/m   Physical Exam Constitutional:      General: He is not in acute distress. HENT:     Head: Normocephalic and atraumatic.     Right Ear: Tympanic membrane and external ear normal.     Left Ear: Tympanic membrane and external ear normal.  Eyes:     General: No scleral icterus. Neck:     Thyroid: No thyromegaly.  Cardiovascular:     Rate and Rhythm: Normal rate and regular rhythm.     Heart sounds: Normal heart sounds.  Pulmonary:     Effort:  Pulmonary effort is normal.     Breath sounds: Normal breath sounds.  Abdominal:     General: Bowel sounds are normal. There is no distension.     Palpations: Abdomen is soft.     Tenderness: There is no abdominal tenderness. There is no guarding.  Musculoskeletal:     Cervical back: Normal range of motion.  Lymphadenopathy:     Cervical: No cervical adenopathy.  Skin:    General: Skin is warm and dry.     Findings: No rash.  Neurological:     Mental Status: He is alert and oriented to person, place, and time.     Cranial Nerves: No cranial nerve deficit.     Motor: No abnormal muscle tone.  Psychiatric:        Mood and Affect: Mood normal.        Behavior: Behavior normal.     ------------------------------------------------------------------------------------------------------------------------------------------------------------------------------------------------------------------- Assessment and Plan  Well adult exam Well adult Orders Placed This Encounter  Procedures   COMPLETE METABOLIC PANEL WITH GFR   CBC with Differential   Lipid Panel w/reflex Direct LDL   TSH   PSA   Vitamin D (25 hydroxy)  Screenings: Per lab orders Immunizations:  Flu vaccine given.  Shingrix #2- Anticipatory guidance/Risk factor reduction:  Recommendations per AVS.     No orders of the defined types were placed in this encounter.   No follow-ups on file.    This visit occurred during the SARS-CoV-2 public health emergency.  Safety protocols were in place, including screening questions prior to the visit, additional usage of staff PPE, and extensive cleaning of exam room while observing appropriate contact time as indicated for disinfecting solutions.

## 2021-12-29 LAB — CBC WITH DIFFERENTIAL/PLATELET
Absolute Monocytes: 666 cells/uL (ref 200–950)
Basophils Absolute: 62 cells/uL (ref 0–200)
Basophils Relative: 1.1 %
Eosinophils Absolute: 330 cells/uL (ref 15–500)
Eosinophils Relative: 5.9 %
HCT: 47.6 % (ref 38.5–50.0)
Hemoglobin: 16.2 g/dL (ref 13.2–17.1)
Lymphs Abs: 1602 cells/uL (ref 850–3900)
MCH: 32.3 pg (ref 27.0–33.0)
MCHC: 34 g/dL (ref 32.0–36.0)
MCV: 94.8 fL (ref 80.0–100.0)
MPV: 10.2 fL (ref 7.5–12.5)
Monocytes Relative: 11.9 %
Neutro Abs: 2940 cells/uL (ref 1500–7800)
Neutrophils Relative %: 52.5 %
Platelets: 205 10*3/uL (ref 140–400)
RBC: 5.02 10*6/uL (ref 4.20–5.80)
RDW: 11.9 % (ref 11.0–15.0)
Total Lymphocyte: 28.6 %
WBC: 5.6 10*3/uL (ref 3.8–10.8)

## 2021-12-29 LAB — COMPLETE METABOLIC PANEL WITH GFR
AG Ratio: 1.8 (calc) (ref 1.0–2.5)
ALT: 17 U/L (ref 9–46)
AST: 23 U/L (ref 10–35)
Albumin: 4.6 g/dL (ref 3.6–5.1)
Alkaline phosphatase (APISO): 59 U/L (ref 35–144)
BUN: 16 mg/dL (ref 7–25)
CO2: 30 mmol/L (ref 20–32)
Calcium: 9.9 mg/dL (ref 8.6–10.3)
Chloride: 105 mmol/L (ref 98–110)
Creat: 1.13 mg/dL (ref 0.70–1.35)
Globulin: 2.5 g/dL (calc) (ref 1.9–3.7)
Glucose, Bld: 88 mg/dL (ref 65–99)
Potassium: 4.6 mmol/L (ref 3.5–5.3)
Sodium: 140 mmol/L (ref 135–146)
Total Bilirubin: 0.9 mg/dL (ref 0.2–1.2)
Total Protein: 7.1 g/dL (ref 6.1–8.1)
eGFR: 73 mL/min/{1.73_m2} (ref >=60)

## 2021-12-29 LAB — LIPID PANEL W/REFLEX DIRECT LDL
Cholesterol: 112 mg/dL (ref <200)
HDL: 38 mg/dL — ABNORMAL LOW (ref >=40)
LDL Cholesterol (Calc): 56 mg/dL (calc)
Non-HDL Cholesterol (Calc): 74 mg/dL (calc) (ref <130)
Total CHOL/HDL Ratio: 2.9 (calc) (ref <5.0)
Triglycerides: 93 mg/dL (ref <150)

## 2021-12-29 LAB — VITAMIN D 25 HYDROXY (VIT D DEFICIENCY, FRACTURES): Vit D, 25-Hydroxy: 25 ng/mL — ABNORMAL LOW (ref 30–100)

## 2021-12-29 LAB — PSA: PSA: 0.77 ng/mL (ref <=4.00)

## 2021-12-29 LAB — TSH: TSH: 1.42 mIU/L (ref 0.40–4.50)

## 2021-12-31 ENCOUNTER — Telehealth: Payer: Self-pay | Admitting: Cardiology

## 2021-12-31 ENCOUNTER — Other Ambulatory Visit: Payer: Self-pay | Admitting: Family Medicine

## 2021-12-31 MED ORDER — FLECAINIDE ACETATE 100 MG PO TABS: 100.0000 mg | ORAL_TABLET | Freq: Two times a day (BID) | ORAL | 1 refills | Status: DC

## 2021-12-31 NOTE — Telephone Encounter (Signed)
*  STAT* If patient is at the pharmacy, call can be transferred to refill team.   1. Which medications need to be refilled? (please list name of each medication and dose if known) flecainide (TAMBOCOR) 100 MG tablet  2. Which pharmacy/location (including street and city if local pharmacy) is medication to be sent to? [id number] - [place], [place] - [address]  3. Do they need a 30 day or 90 day supply? 90 day supply

## 2022-02-15 DIAGNOSIS — G4733 Obstructive sleep apnea (adult) (pediatric): Secondary | ICD-10-CM | POA: Diagnosis not present

## 2022-02-19 DIAGNOSIS — G4733 Obstructive sleep apnea (adult) (pediatric): Secondary | ICD-10-CM | POA: Diagnosis not present

## 2022-03-01 ENCOUNTER — Ambulatory Visit (INDEPENDENT_AMBULATORY_CARE_PROVIDER_SITE_OTHER): Payer: Federal, State, Local not specified - PPO | Admitting: Family Medicine

## 2022-03-01 VITALS — BP 109/65 | HR 57 | Temp 98.7°F | Ht 72.0 in

## 2022-03-01 DIAGNOSIS — Z23 Encounter for immunization: Secondary | ICD-10-CM | POA: Diagnosis not present

## 2022-03-01 NOTE — Progress Notes (Unsigned)
   Established Patient Office Visit  Subjective   Patient ID: William Morton, male    DOB: [birth year]  Age: 66 y.o. MRN: [id number]  Chief Complaint  Patient presents with   2nd shingles vaccine    Nurse visit    HPI 2nd Shingrix vaccine-  first give 12/2020- nurse visit  ROS    Objective:     BP 109/65   Pulse (!) 57   Temp 98.7 F (37.1 C)   Ht 6' (1.829 m)   SpO2 98%   BMI 30.24 kg/m  {Vitals History (Optional):23777}  Physical Exam   No results found for any visits on 03/01/22.  {Labs (Optional):23779}  The ASCVD Risk score (Arnett DK, et al., 2019) failed to calculate for the following reasons:   The valid total cholesterol range is 130 to 320 mg/dL    Assessment & Plan:  2nd shingrix vaccine- administered vaccine - patient tolerated injection well without complication.  Problem List Items Addressed This Visit   None Visit Diagnoses     Need for shingles vaccine    -  Primary   Relevant Orders   Varicella-zoster vaccine IM (Completed)       No follow-ups on file.    Elizabeth Palau, LPN

## 2022-03-03 NOTE — Progress Notes (Signed)
Medical screening examination/treatment was performed by qualified clinical staff member and as supervising physician I was immediately available for consultation/collaboration. I have reviewed documentation and agree with assessment and plan.  Kumari Sculley, DO  

## 2022-03-17 DIAGNOSIS — G4733 Obstructive sleep apnea (adult) (pediatric): Secondary | ICD-10-CM | POA: Diagnosis not present

## 2022-03-25 ENCOUNTER — Ambulatory Visit: Payer: Federal, State, Local not specified - PPO | Admitting: Cardiology

## 2022-04-17 DIAGNOSIS — G4733 Obstructive sleep apnea (adult) (pediatric): Secondary | ICD-10-CM | POA: Diagnosis not present

## 2022-04-22 NOTE — Progress Notes (Unsigned)
   PCP:  Luetta Nutting, DO Primary Cardiologist: Pixie Casino, MD Electrophysiologist: Constance Haw, MD   William Morton is a 67 y.o. male with h/o Paroxysmal AF, HLD, OSA, Hypothyroidism, and PVCs seen today for Will Meredith Leeds, MD for routine electrophysiology followup. Since last being seen in our clinic the patient reports doing very well. He has actually felt better on the higher dose of flecainide (increased for PVCs)  he denies chest pain, palpitations, dyspnea, PND, orthopnea, nausea, vomiting, dizziness, syncope, edema, weight gain, or early satiety.   Current Outpatient Medications  Medication Instructions   AMBULATORY NON FORMULARY MEDICATION Please supply APAP machine with humidfier, tubing, mask and filters<BR>Auto titration:  7-15cmH20<BR>No EPR. <BR>Mask: Per patient preference.  Previously Fitted for [employer] and [employer] Simplus Full Face Mask<BR>Send Compliance data after 30 days to Dr. Zigmund Daniel at [phone]   atorvastatin (LIPITOR) 20 mg, Oral, Daily   cetirizine (ZYRTEC) 10 mg, Oral, Daily   flecainide (TAMBOCOR) 100 mg, Oral, 2 times daily, Dose increase   Ibuprofen 400 mg, Oral, Daily   levothyroxine (SYNTHROID) 100 MCG tablet TAKE 1 TABLET BY MOUTH EVERY DAY   meloxicam (MOBIC) 15 mg, Oral, As needed   metoprolol succinate (TOPROL-XL) 25 MG 24 hr tablet TAKE 1 TABLET BY MOUTH EVERY DAY   nystatin-triamcinolone ointment (MYCOLOG) 1 application , Topical, As needed   Physical Exam: Vitals:   04/25/22 0832  BP: 108/66  Pulse: 63  SpO2: 97%  Weight: 222 lb (100.7 kg)  Height: 6' (1.829 m)    GEN- The patient is well appearing, alert and oriented x 3 today.   HEENT: normocephalic, atraumatic Lungs- Clear to ausculation bilaterally, normal work of breathing.  Heart- Regular rate and rhythm, no murmurs, rubs or gallops GI- soft, non-tender, non-distended Extremities- No peripheral edema. No clubbing or cyanosis; DP/PT/radial pulses 2+  bilaterally MS- no significant deformity or atrophy Skin- warm and dry, no rash or lesion Psych- euthymic mood, full affect  EKG is ordered. Personal review of EKG from today shows NSR at 61 bpm, intervals stable from prior  Additional studies reviewed include: Previous EP notes.   Assessment and Plan:  1. Paroxysmal Atrial Fibrillation  EKG today shows NSR with stable intervals Maintaining NSR Continue Flecainide 100 mg BID Continue Toprol  25 mg daily  2. OSA  Encouraged nightly CPAP   3. PVCs None on EKG today. Previously had 11% on monitor and EKG was increased.  Follow up with Dr. Curt Bears in in 6 months  Shirley Friar, [place]  04/25/22 8:38 AM

## 2022-04-25 ENCOUNTER — Ambulatory Visit: Payer: Federal, State, Local not specified - PPO | Attending: Cardiology | Admitting: Student

## 2022-04-25 ENCOUNTER — Encounter: Payer: Self-pay | Admitting: Student

## 2022-04-25 VITALS — BP 108/66 | HR 63 | Ht 72.0 in | Wt 222.0 lb

## 2022-04-25 DIAGNOSIS — I480 Paroxysmal atrial fibrillation: Secondary | ICD-10-CM | POA: Diagnosis not present

## 2022-04-25 DIAGNOSIS — G4733 Obstructive sleep apnea (adult) (pediatric): Secondary | ICD-10-CM | POA: Diagnosis not present

## 2022-04-25 DIAGNOSIS — I493 Ventricular premature depolarization: Secondary | ICD-10-CM | POA: Diagnosis not present

## 2022-04-25 NOTE — Patient Instructions (Signed)
Medication Instructions:  Your physician recommends that you continue on your current medications as directed. Please refer to the Current Medication list given to you today.  *If you need a refill on your cardiac medications before your next appointment, please call your pharmacy*   Lab Work: None If you have labs (blood work) drawn today and your tests are completely normal, you will receive your results only by: MyChart Message (if you have MyChart) OR A paper copy in the mail If you have any lab test that is abnormal or we need to change your treatment, we will call you to review the results.   Follow-Up: At [facility], you and your health needs are our priority.  As part of our continuing mission to provide you with exceptional heart care, we have created designated Provider Care Teams.  These Care Teams include your primary Cardiologist (physician) and Advanced Practice Providers (APPs -  Physician Assistants and Nurse Practitioners) who all work together to provide you with the care you need, when you need it.   Your next appointment:   6 month(s)  Provider:   Will Camnitz, MD     

## 2022-06-28 ENCOUNTER — Ambulatory Visit: Payer: Federal, State, Local not specified - PPO | Admitting: Family Medicine

## 2022-06-28 ENCOUNTER — Encounter: Payer: Self-pay | Admitting: Family Medicine

## 2022-06-28 VITALS — BP 124/78 | HR 50 | Ht 72.0 in | Wt 221.0 lb

## 2022-06-28 DIAGNOSIS — E039 Hypothyroidism, unspecified: Secondary | ICD-10-CM | POA: Diagnosis not present

## 2022-06-28 DIAGNOSIS — I480 Paroxysmal atrial fibrillation: Secondary | ICD-10-CM | POA: Diagnosis not present

## 2022-06-28 DIAGNOSIS — H43391 Other vitreous opacities, right eye: Secondary | ICD-10-CM | POA: Diagnosis not present

## 2022-06-28 DIAGNOSIS — E782 Mixed hyperlipidemia: Secondary | ICD-10-CM | POA: Diagnosis not present

## 2022-06-28 NOTE — Assessment & Plan Note (Signed)
Lab Results  Component Value Date   LDLCALC 56 12/28/2021  Continue atorvastatin at current strength.

## 2022-06-28 NOTE — Assessment & Plan Note (Signed)
Doing well with current strength of levothyroxine.  Will plan to continue.

## 2022-06-28 NOTE — Progress Notes (Signed)
William Morton - 67 y.o. male MRN [id number]  Date of birth: 12-Aug-1955  Subjective Chief Complaint  Patient presents with   Follow-up   Eye Injury    HPI William Morton is a 67 y.o. male here today for follow up visit.   He reports that he is doing ok.  Noticed visual floater yesterday.  This is affecting his vision.  Describes as round/circular area of vision loss.  Denies pain.  Denies flashes.   He has history of A. Fib and continues to see cardiology regularly. Remains on flecainide and metoprolol.  Doing well on this combination.  Denies symptoms at this time.  BP has remained stable.  He has not had palpitations, chest pain, shortness of breath, palpitations, headache or vision changes.   Continues on atorvastatin for management of HLD.  Tolerating well.   Feels good with current strength of levothyroxine.  Allergies  Allergen Reactions   Penicillins Swelling and Rash    Swelling in joints    Past Medical History:  Diagnosis Date   Atrial fibrillation ([place]) 2017   Hyperlipidemia    OSA (obstructive sleep apnea)    Thyroid disease    Vitamin D deficiency     Past Surgical History:  Procedure Laterality Date   CPAP TITRATION  09/22/2015   EYE SURGERY     TONSILLECTOMY      Social History   Socioeconomic History   Marital status: Unknown    Spouse name: Not on file   Number of children: Not on file   Years of education: Not on file   Highest education level: Not on file  Occupational History   Occupation: OFFICER    Employer: [employer]  Tobacco Use   Smoking status: Never   Smokeless tobacco: Never  Substance and Sexual Activity   Alcohol use: Yes   Drug use: No   Sexual activity: Not on file  Other Topics Concern   Not on file  Social History Narrative   Not on file   Social Determinants of Health   Financial Resource Strain: Not on file  Food Insecurity: Not on file  Transportation Needs: Not on file  Physical Activity: Not on file  Stress: Not  on file  Social Connections: Not on file    Family History  Problem Relation Age of Onset   Heart disease Mother    Heart attack Mother    Cancer Brother        throat CA   Arrhythmia Father    Cancer Father     Health Maintenance  Topic Date Due   COVID-19 Vaccine (3 - Moderna risk series) 07/04/2022 (Originally 08/31/2019)   COLONOSCOPY (Pts 45-43yrs Insurance coverage will need to be confirmed)  09/21/2022 (Originally 04/15/2020)   DTaP/Tdap/Td (2 - Td or Tdap) 03/20/2027   INFLUENZA VACCINE  Completed   Hepatitis C Screening  Completed   HIV Screening  Completed   Zoster Vaccines- Shingrix  Completed   HPV VACCINES  Aged Out     ----------------------------------------------------------------------------------------------------------------------------------------------------------------------------------------------------------------- Physical Exam BP 124/78 (BP Location: Left Arm, Patient Position: Sitting, Cuff Size: Normal)   Pulse (!) 50   Ht 6' (1.829 m)   Wt 221 lb (100.2 kg)   SpO2 98%   BMI 29.97 kg/m   Physical Exam Constitutional:      Appearance: Normal appearance.  HENT:     Head: Normocephalic and atraumatic.  Eyes:     Extraocular Movements: Extraocular movements intact.     Pupils: Pupils are  equal, round, and reactive to light.     Comments: Red blob like appearance in the vitreous, ?vitreous hemorrhage.   Neurological:     Mental Status: He is alert.     ------------------------------------------------------------------------------------------------------------------------------------------------------------------------------------------------------------------- Assessment and Plan  Floaters in visual field, right ?Vitreous hemorrhage.  Urgent referral to ophthalmology entered.    Atrial fibrillation [facility]) Doing well with current medications.  Continue management per cardiology.    Hypothyroid Doing well with current strength of  levothyroxine.  Will plan to continue.    HLD (hyperlipidemia) Lab Results  Component Value Date   LDLCALC 56 12/28/2021  Continue atorvastatin at current strength.    No orders of the defined types were placed in this encounter.   No follow-ups on file.    This visit occurred during the SARS-CoV-2 public health emergency.  Safety protocols were in place, including screening questions prior to the visit, additional usage of staff PPE, and extensive cleaning of exam room while observing appropriate contact time as indicated for disinfecting solutions.

## 2022-06-28 NOTE — Assessment & Plan Note (Signed)
?  Vitreous hemorrhage.  Urgent referral to ophthalmology entered.

## 2022-06-28 NOTE — Assessment & Plan Note (Signed)
Doing well with current medications.  Continue management per cardiology.

## 2022-07-01 DIAGNOSIS — H43811 Vitreous degeneration, right eye: Secondary | ICD-10-CM | POA: Diagnosis not present

## 2022-07-01 DIAGNOSIS — H43391 Other vitreous opacities, right eye: Secondary | ICD-10-CM | POA: Diagnosis not present

## 2022-07-01 DIAGNOSIS — H524 Presbyopia: Secondary | ICD-10-CM | POA: Diagnosis not present

## 2022-07-06 ENCOUNTER — Other Ambulatory Visit: Payer: Self-pay | Admitting: Family Medicine

## 2022-07-11 NOTE — Telephone Encounter (Signed)
Ok to refill 

## 2022-07-15 ENCOUNTER — Ambulatory Visit: Payer: Federal, State, Local not specified - PPO | Admitting: Family Medicine

## 2022-07-15 ENCOUNTER — Encounter: Payer: Self-pay | Admitting: Family Medicine

## 2022-07-15 VITALS — BP 111/70 | HR 68 | Temp 99.1°F | Ht 72.0 in | Wt 219.0 lb

## 2022-07-15 DIAGNOSIS — Z20822 Contact with and (suspected) exposure to covid-19: Secondary | ICD-10-CM

## 2022-07-15 DIAGNOSIS — J329 Chronic sinusitis, unspecified: Secondary | ICD-10-CM | POA: Insufficient documentation

## 2022-07-15 DIAGNOSIS — R509 Fever, unspecified: Secondary | ICD-10-CM

## 2022-07-15 DIAGNOSIS — J029 Acute pharyngitis, unspecified: Secondary | ICD-10-CM | POA: Diagnosis not present

## 2022-07-15 DIAGNOSIS — R0981 Nasal congestion: Secondary | ICD-10-CM | POA: Diagnosis not present

## 2022-07-15 DIAGNOSIS — R051 Acute cough: Secondary | ICD-10-CM

## 2022-07-15 DIAGNOSIS — J40 Bronchitis, not specified as acute or chronic: Secondary | ICD-10-CM

## 2022-07-15 LAB — POCT RAPID STREP A (OFFICE): Rapid Strep A Screen: NEGATIVE

## 2022-07-15 LAB — POCT INFLUENZA A/B
Influenza A, POC: NEGATIVE
Influenza B, POC: NEGATIVE

## 2022-07-15 LAB — POC COVID19 BINAXNOW: SARS Coronavirus 2 Ag: NEGATIVE

## 2022-07-15 MED ORDER — DOXYCYCLINE HYCLATE 100 MG PO TABS: 100.0000 mg | ORAL_TABLET | Freq: Two times a day (BID) | ORAL | 0 refills | Status: AC

## 2022-07-15 MED ORDER — HYDROCOD POLI-CHLORPHE POLI ER 10-8 MG/5ML PO SUER: 5.0000 mL | Freq: Two times a day (BID) | ORAL | 0 refills | Status: DC | PRN

## 2022-07-15 NOTE — Progress Notes (Unsigned)
Established patient visit   Patient: William Morton   DOB: [birth year]   67 y.o. Male  MRN: [id number] Visit Date: 07/15/2022  Today's healthcare provider: Owens Loffler, DO   Chief Complaint  Patient presents with   Sore Throat   Cough   Nasal Congestion   Headache   Fatigue   Fever    SUBJECTIVE    Chief Complaint  Patient presents with   Sore Throat   Cough   Nasal Congestion   Headache   Fatigue   Fever   HPI  Patient presents with congestion and sinus pain.   Review of Systems  Constitutional:  Negative for activity change, fatigue and fever.  Respiratory:  Negative for cough and shortness of breath.   Cardiovascular:  Negative for chest pain.  Gastrointestinal:  Negative for abdominal pain.  Genitourinary:  Negative for difficulty urinating.       Current Meds  Medication Sig   AMBULATORY NON FORMULARY MEDICATION Please supply APAP machine with humidfier, tubing, mask and filters Auto titration:  7-15cmH20 No EPR.  Mask: Per patient preference.  Previously Fitted for [employer] and [employer] Full Face Mask Send Compliance data after 30 days to Dr. Zigmund Daniel at [phone] (Patient taking differently: Please supply CPAP machine with humidfier, tubing, mask and filters Auto titration:  7-15cmH20 No EPR.  Mask: Per patient preference.  Previously Fitted for [employer] and [employer] Full Face Mask Send Compliance data after 30 days to Dr. Zigmund Daniel at [phone])   cetirizine (ZYRTEC) 10 MG tablet Take 10 mg by mouth daily.   chlorpheniramine-HYDROcodone (TUSSIONEX) 10-8 MG/5ML Take 5 mLs by mouth every 12 (twelve) hours as needed for cough (cough, will cause drowsiness.).   doxycycline (VIBRA-TABS) 100 MG tablet Take 1 tablet (100 mg total) by mouth 2 (two) times daily for 7 days.   flecainide (TAMBOCOR) 100 MG tablet TAKE 1 TABLET (100 MG TOTAL) BY MOUTH 2 (TWO) TIMES DAILY. DOSE INCREASE   Ibuprofen 200 MG CAPS Take 400 mg by mouth  daily.   levothyroxine (SYNTHROID) 100 MCG tablet TAKE 1 TABLET BY MOUTH EVERY DAY   meloxicam (MOBIC) 15 MG tablet Take 15 mg by mouth as needed for pain.   metoprolol succinate (TOPROL-XL) 25 MG 24 hr tablet TAKE 1 TABLET BY MOUTH EVERY DAY   nystatin-triamcinolone ointment (MYCOLOG) Apply 1 application topically as needed (for skin).    OBJECTIVE    BP 111/70 (BP Location: Left Arm, Patient Position: Sitting, Cuff Size: Large)   Pulse 68   Temp 99.1 F (37.3 C) (Oral)   Ht 6' (1.829 m)   Wt 219 lb (99.3 kg)   SpO2 96%   BMI 29.70 kg/m   Physical Exam Vitals and nursing note reviewed.  Constitutional:      General: He is not in acute distress.    Appearance: Normal appearance.  HENT:     Head: Normocephalic and atraumatic.     Right Ear: External ear normal.     Left Ear: External ear normal.     Nose: Nose normal.  Eyes:     Conjunctiva/sclera: Conjunctivae normal.  Cardiovascular:     Rate and Rhythm: Normal rate and regular rhythm.  Pulmonary:     Effort: Pulmonary effort is normal.     Breath sounds: Normal breath sounds.  Neurological:     General: No focal deficit present.     Mental Status: He is alert and oriented to person, place, and time.  Psychiatric:        Mood and Affect: Mood normal.        Behavior: Behavior normal.        Thought Content: Thought content normal.        Judgment: Judgment normal.       ASSESSMENT & PLAN    Problem List Items Addressed This Visit   None Visit Diagnoses     Suspected COVID-19 virus infection    -  Primary   Relevant Orders   POC COVID-19 (Completed)   Nasal congestion       Relevant Orders   POCT Influenza A/B (Completed)   Fever, unspecified fever cause       Relevant Orders   POCT Influenza A/B (Completed)   Sore throat       Relevant Orders   POCT rapid strep A (Completed)   Sinobronchitis       Relevant Medications   doxycycline (VIBRA-TABS) 100 MG tablet   chlorpheniramine-HYDROcodone  (TUSSIONEX) 10-8 MG/5ML   Acute cough       Relevant Medications   chlorpheniramine-HYDROcodone (TUSSIONEX) 10-8 MG/5ML       No follow-ups on file.      Meds ordered this encounter  Medications   doxycycline (VIBRA-TABS) 100 MG tablet    Sig: Take 1 tablet (100 mg total) by mouth 2 (two) times daily for 7 days.    Dispense:  14 tablet    Refill:  0   chlorpheniramine-HYDROcodone (TUSSIONEX) 10-8 MG/5ML    Sig: Take 5 mLs by mouth every 12 (twelve) hours as needed for cough (cough, will cause drowsiness.).    Dispense:  120 mL    Refill:  0    Orders Placed This Encounter  Procedures   POCT rapid strep A   POCT Influenza A/B   POC COVID-19    Order Specific Question:   Previously tested for COVID-19    Answer:   No    Order Specific Question:   Resident in a congregate (group) care setting    Answer:   No    Order Specific Question:   Employed in healthcare setting    Answer:   No     Owens Loffler, [place] at [facility] [phone] (phone) [phone] (fax)  [place]

## 2022-07-15 NOTE — Assessment & Plan Note (Signed)
-   given doxycycline due to maxillary sinus pain and pressure to palpation

## 2022-07-15 NOTE — Assessment & Plan Note (Addendum)
-   given patient cough given tussinex

## 2022-08-10 DIAGNOSIS — H43391 Other vitreous opacities, right eye: Secondary | ICD-10-CM | POA: Diagnosis not present

## 2022-08-10 DIAGNOSIS — H2512 Age-related nuclear cataract, left eye: Secondary | ICD-10-CM | POA: Diagnosis not present

## 2022-08-10 DIAGNOSIS — H43811 Vitreous degeneration, right eye: Secondary | ICD-10-CM | POA: Diagnosis not present

## 2022-09-16 DIAGNOSIS — H25812 Combined forms of age-related cataract, left eye: Secondary | ICD-10-CM | POA: Diagnosis not present

## 2022-09-16 DIAGNOSIS — H43811 Vitreous degeneration, right eye: Secondary | ICD-10-CM | POA: Diagnosis not present

## 2022-09-16 DIAGNOSIS — H524 Presbyopia: Secondary | ICD-10-CM | POA: Diagnosis not present

## 2022-09-16 DIAGNOSIS — Z961 Presence of intraocular lens: Secondary | ICD-10-CM | POA: Diagnosis not present

## 2022-10-07 ENCOUNTER — Encounter: Payer: Self-pay | Admitting: Physician Assistant

## 2022-10-07 ENCOUNTER — Ambulatory Visit: Payer: Federal, State, Local not specified - PPO | Admitting: Physician Assistant

## 2022-10-07 VITALS — BP 120/81 | HR 71 | Ht 72.0 in | Wt 213.0 lb

## 2022-10-07 DIAGNOSIS — R6884 Jaw pain: Secondary | ICD-10-CM | POA: Diagnosis not present

## 2022-10-07 NOTE — Patient Instructions (Signed)
Continue tylenol and ibuprofen for pain CT ordered

## 2022-10-07 NOTE — Progress Notes (Signed)
Acute Office Visit  Subjective:     Patient ID: William Morton, male    DOB: [birth year], 67 y.o.   MRN: [id number]  Chief Complaint  Patient presents with   Pain    Right side face pain    Patient presents with right sided facial pain for last 4 days. Pain is described as an intense pressure "someone stepping on my face" with a deep and achy pain. It radiates from his lower jaw up along his cheek bone and all down his jaw. The forehead, temple, nose, and ear are spared. Pain is relieved with Tylenol but is rated 6/10 without Tylenol. It is unaffected by chewing and mouth movements.   He currently has several right bottom teeth that need dental work and a wisdom tooth on his right upper jaw needing removal. However, he has not been to the dentist in years and has no PMH of dental infections. PMH also includes R sided visual floaters but it was investigated and considered benign by ophthalmology. PMH of VZV infection. Shingles vax UTD. Denies dizziness, sensory changes, rash, fever, dysphagia, u/l weakness, eye pain.   Review of Systems  Constitutional:  Negative for chills and fever.  HENT:  Negative for congestion, ear pain, hearing loss, sinus pain and sore throat.   Eyes:  Negative for blurred vision, double vision, pain and redness.  Skin:  Negative for itching and rash.  Neurological:  Negative for dizziness, tingling, sensory change, speech change, weakness and headaches.       Objective:    BP 120/81 (BP Location: Left Arm, Patient Position: Sitting, Cuff Size: Normal)   Pulse 71   Ht 6' (1.829 m)   Wt 213 lb (96.6 kg)   SpO2 99%   BMI 28.89 kg/m  BP Readings from Last 3 Encounters:  10/07/22 120/81  07/15/22 111/70  06/28/22 124/78   Wt Readings from Last 3 Encounters:  10/07/22 213 lb (96.6 kg)  07/15/22 219 lb (99.3 kg)  06/28/22 221 lb (100.2 kg)      Physical Exam HENT:     Head: Normocephalic and atraumatic.     Right Ear: Tympanic membrane, ear  canal and external ear normal.     Nose: Nose normal. No congestion or rhinorrhea.     Mouth/Throat:     Mouth: Mucous membranes are moist.     Pharynx: Oropharynx is clear. No oropharyngeal exudate or posterior oropharyngeal erythema.     Comments: No signs of obstruction or dental infection. Non-TTP. No TMJ. Uvula midline. Eyes:     Extraocular Movements: Extraocular movements intact.     Conjunctiva/sclera: Conjunctivae normal.     Pupils: Pupils are equal, round, and reactive to light.  Musculoskeletal:     Cervical back: No tenderness.  Lymphadenopathy:     Cervical: No cervical adenopathy.  Skin:    Findings: No bruising, erythema, lesion or rash.     Comments: Non-TTP over right face  Neurological:     General: No focal deficit present.     Mental Status: He is alert and oriented to person, place, and time.     Cranial Nerves: No cranial nerve deficit.     Sensory: No sensory deficit.     Motor: No weakness.     No results found for any visits on 10/07/22.      Assessment & Plan:  Marland KitchenMarland KitchenLinkoln was seen today for pain.  Diagnoses and all orders for this visit:  Pain in lower jaw -  CT Maxillofacial WO CM; Future -     Ambulatory referral to Dentistry  Pain in upper jaw -     CT Maxillofacial WO CM; Future -     Ambulatory referral to Dentistry  Unclear etiology of pain  Pt has not seen dentist in years-referral placed Awaiting insurance approval of CT to evaluate pain No sign of TMJ or salivary stones or sinusitis Could be wisdom tooth and/or dental infection Vitals look great today To continue Tylenol and alternate with ibuprofen to control pain Can use heat or ice PRN   Return if symptoms worsen or fail to improve.  Tandy Gaw, PA-C

## 2022-10-11 ENCOUNTER — Ambulatory Visit (INDEPENDENT_AMBULATORY_CARE_PROVIDER_SITE_OTHER): Payer: Federal, State, Local not specified - PPO

## 2022-10-11 DIAGNOSIS — R519 Headache, unspecified: Secondary | ICD-10-CM | POA: Diagnosis not present

## 2022-10-11 DIAGNOSIS — R6884 Jaw pain: Secondary | ICD-10-CM

## 2022-10-22 NOTE — Progress Notes (Signed)
No CT findings to explain pain. How are your symptoms? Have you seen dentist yet?

## 2022-11-11 ENCOUNTER — Encounter: Payer: Self-pay | Admitting: Cardiology

## 2022-11-11 NOTE — Telephone Encounter (Signed)
Error

## 2022-11-18 DIAGNOSIS — G4733 Obstructive sleep apnea (adult) (pediatric): Secondary | ICD-10-CM | POA: Diagnosis not present

## 2022-12-04 ENCOUNTER — Other Ambulatory Visit: Payer: Self-pay | Admitting: Cardiology

## 2022-12-12 NOTE — Progress Notes (Signed)
Cardiology Office Note Date:  12/12/2022  Patient ID:  William, Morton 26-Feb-1956, MRN [id number] PCP:  Everrett Coombe, DO  Cardiologist:  Dr. Rennis Golden Electrophysiologist: Dr. Elberta Fortis    Chief Complaint:   flecainide visit  History of Present Illness: William Morton is a 67 y.o. male with history of OSA w/CPAP, HLD, AFib, hypothyroidism, PVCs  He saw Dr. Elberta Fortis 10/09/21, having monthy episodes pf palpitations/Afib , otherwise doing well.  He would perhaps be interesting in pursuing ablation though timing at the time of this visit was not good to look at a procedure. Generally happy with rhythm control. PVCs on his EKG, planned for monitor to assess burden  Monitor with 11% PVC burden, 3% AFib burden > Flecainide increased  Saw [name], PA-C 04/25/22, feeling better, less palpitations  TODAY He is doing well He works [employer] and is on his feet all of the time at work, good exertional capacity without CP or SOB He has some palpitations, about once a month an Afib episode 2-3 hours, he can tell, rates tends to be faster and he doesn't feel as well in Afib No near syncope or syncope No SOB    AFib hx Diagnosed Jan 2017  AAD Hx Flecainide started 2018 w/pill/pocket strategy >> daily 2020  Past Medical History:  Diagnosis Date   Atrial fibrillation ([facility]) 2017   Hyperlipidemia    OSA (obstructive sleep apnea)    Thyroid disease    Vitamin D deficiency     Past Surgical History:  Procedure Laterality Date   CPAP TITRATION  09/22/2015   EYE SURGERY     TONSILLECTOMY      Current Outpatient Medications  Medication Sig Dispense Refill   atorvastatin (LIPITOR) 20 MG tablet Take 1 tablet (20 mg total) by mouth daily. 90 tablet 3   cetirizine (ZYRTEC) 10 MG tablet Take 10 mg by mouth daily.     flecainide (TAMBOCOR) 100 MG tablet TAKE 1 TABLET (100 MG TOTAL) BY MOUTH 2 (TWO) TIMES DAILY. DOSE INCREASE 180 tablet 3   Ibuprofen 200 MG CAPS Take 400 mg by mouth daily.      levothyroxine (SYNTHROID) 100 MCG tablet TAKE 1 TABLET BY MOUTH EVERY DAY 90 tablet 1   meloxicam (MOBIC) 15 MG tablet Take 15 mg by mouth as needed for pain.     metoprolol succinate (TOPROL-XL) 25 MG 24 hr tablet TAKE 1 TABLET BY MOUTH EVERY DAY 90 tablet 3   nystatin-triamcinolone ointment (MYCOLOG) Apply 1 application topically as needed (for skin).     No current facility-administered medications for this visit.    Allergies:   Penicillins   Social History:  The patient  reports that he has never smoked. He has never used smokeless tobacco. He reports current alcohol use. He reports that he does not use drugs.   Family History:  The patient's family history includes Arrhythmia in his father; Cancer in his brother and father; Heart attack in his mother; Heart disease in his mother.  ROS:  Please see the history of present illness.    All other systems are reviewed and otherwise negative.   PHYSICAL EXAM:  VS:  There were no vitals taken for this visit. BMI: There is no height or weight on file to calculate BMI. Well nourished, well developed, in no acute distress HEENT: normocephalic, atraumatic Neck: no JVD, carotid bruits or masses Cardiac:  RRR; no significant murmurs, no rubs, or gallops Lungs: CTA b/l, no wheezing, rhonchi or rales  Abd: soft, nontender MS: no deformity or atrophy Ext: no edema Skin: warm and dry, no rash Neuro:  No gross deficits appreciated Psych: euthymic mood, full affect    EKG:  Done today and reviewed by myself shows  SR 62bpm, PR , RBBB ( ), LAD  July 2023: monitor Predominant rhythm was sinus rhythm 3% atrial fibrillation burden Triggered episodes associated with PVCs 11.8% ventricular ectopy burden  04/22/2018: TTE Study Conclusions  - Left ventricle: The cavity size was normal. Wall thickness was    increased in a pattern of mild LVH. Systolic function was normal.    The estimated ejection fraction was in the range of 60% to  65%.    Wall motion was normal; there were no regional wall motion    abnormalities. The study is indeterminate for the evaluation of    LV diastolic function.  - Mitral valve: There was trivial regurgitation.  - Left atrium: The atrium was mildly dilated.  - Right ventricle: The cavity size was mildly dilated. Wall    thickness was normal.  - Right atrium: The atrium was mildly dilated.  - Atrial septum: No defect or patent foramen ovale was identified.  - Tricuspid valve: There was trivial regurgitation.  - Pulmonary arteries: Systolic pressure was within the normal    range. Impressions: - Normal LV EF. Mild biatrial enlargement, mild RV enlargement.   04/09/2018: EST Non-diagnostic for ischemia due to baseline ST changes Rhythm rapid afib 2 mm ST depression with stress  Poor resting HR control 111 bpm No QRS widening with stress   Recent Labs: 12/28/2021: ALT 17; BUN 16; Creat 1.13; Hemoglobin 16.2; Platelets 205; Potassium 4.6; Sodium 140; TSH 1.42  12/28/2021: Cholesterol 112; HDL 38; LDL Cholesterol (Calc) 56; Total CHOL/HDL Ratio 2.9; Triglycerides 93   CrCl cannot be calculated (Patient's most recent lab result is older than the maximum 21 days allowed.).   Wt Readings from Last 3 Encounters:  10/07/22 213 lb (96.6 kg)  07/15/22 219 lb (99.3 kg)  06/28/22 221 lb (100.2 kg)     Other studies reviewed: Additional studies/records reviewed today include: summarized above  ASSESSMENT AND PLAN:  Paroxysmal Afib CHA2DS2Vasc is zero >> now one for age, not on a/c  2.   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 Elberta Fortis Will reduce Flecainide back to 50mg  BID Reconsider ablation The patient would be interested in ablation, in fact recently was trying to inquire with his insurance what his co pay would end up being. I have d/w Dr. Elberta Fortis RN, she will send him the code via my chart message  RN visit for EKG in 2  weeks    Disposition: f/u in 6 weeks, sooner if needed.    Current medicines are reviewed at length with the patient today.  The patient did not have any concerns regarding medicines.  Norma Fredrickson, PA-C 12/12/2022 12:19 PM     [facility] [address] [place] [address] [phone] (office)  [phone] (fax)

## 2022-12-13 ENCOUNTER — Ambulatory Visit: Payer: Federal, State, Local not specified - PPO | Attending: Physician Assistant | Admitting: Physician Assistant

## 2022-12-13 ENCOUNTER — Encounter: Payer: Self-pay | Admitting: Physician Assistant

## 2022-12-13 VITALS — BP 128/76 | HR 62 | Ht 72.0 in | Wt 218.4 lb

## 2022-12-13 DIAGNOSIS — I480 Paroxysmal atrial fibrillation: Secondary | ICD-10-CM

## 2022-12-13 DIAGNOSIS — Z79899 Other long term (current) drug therapy: Secondary | ICD-10-CM | POA: Diagnosis not present

## 2022-12-13 DIAGNOSIS — I493 Ventricular premature depolarization: Secondary | ICD-10-CM | POA: Diagnosis not present

## 2022-12-13 DIAGNOSIS — Z5181 Encounter for therapeutic drug level monitoring: Secondary | ICD-10-CM

## 2022-12-13 MED ORDER — FLECAINIDE ACETATE 50 MG PO TABS: 50.0000 mg | ORAL_TABLET | Freq: Two times a day (BID) | ORAL | 2 refills | Status: DC

## 2022-12-13 NOTE — Patient Instructions (Signed)
Medication Instructions:   START TAKING : FLECAINIDE 50 MG TWICE A DAY   *If you need a refill on your cardiac medications before your next appointment, please call your pharmacy*   Lab Work:NONE ORDERED  TODAY    If you have labs (blood work) drawn today and your tests are completely normal, you will receive your results only by: MyChart Message (if you have MyChart) OR A paper copy in the mail If you have any lab test that is abnormal or we need to change your treatment, we will call you to review the results.   Testing/Procedures: NONE ORDERED  TODAY     Follow-Up: At [facility], you and your health needs are our priority.  As part of our continuing mission to provide you with exceptional heart care, we have created designated Provider Care Teams.  These Care Teams include your primary Cardiologist (physician) and Advanced Practice Providers (APPs -  Physician Assistants and Nurse Practitioners) who all work together to provide you with the care you need, when you need it.  We recommend signing up for the patient portal called "MyChart".  Sign up information is provided on this After Visit Summary.  MyChart is used to connect with patients for Virtual Visits (Telemedicine).  Patients are able to view lab/test results, encounter notes, upcoming appointments, etc.  Non-urgent messages can be sent to your provider as well.   To learn more about what you can do with MyChart, go to [email or web address].    Your next appointment: IN 2 WEEKS   NURSE VISIT WITH EKG  6-8 week(s)  Provider:   Loman Brooklyn, MD or Francis Dowse, PA-C    Other Instructions

## 2022-12-19 DIAGNOSIS — G4733 Obstructive sleep apnea (adult) (pediatric): Secondary | ICD-10-CM | POA: Diagnosis not present

## 2022-12-27 ENCOUNTER — Other Ambulatory Visit: Payer: Self-pay

## 2022-12-27 ENCOUNTER — Ambulatory Visit: Payer: Federal, State, Local not specified - PPO | Attending: Internal Medicine | Admitting: *Deleted

## 2022-12-27 VITALS — BP 120/72 | HR 64 | Ht 72.0 in | Wt 212.6 lb

## 2022-12-27 DIAGNOSIS — Z79899 Other long term (current) drug therapy: Secondary | ICD-10-CM

## 2022-12-27 DIAGNOSIS — Z5181 Encounter for therapeutic drug level monitoring: Secondary | ICD-10-CM

## 2022-12-27 DIAGNOSIS — I493 Ventricular premature depolarization: Secondary | ICD-10-CM

## 2022-12-27 NOTE — Patient Instructions (Addendum)
Medication Instructions:   Your physician recommends that you continue on your current medications as directed. Please refer to the Current Medication list given to you today.  *If you need a refill on your cardiac medications before your next appointment, please call your pharmacy*     Follow-Up:  With Francis Dowse PA-C on 01/24/23 at 3:35 pm

## 2022-12-27 NOTE — Progress Notes (Signed)
Nurse Visit   Date of Encounter: 12/27/2022 ID: Markie Sopp, DOB 1956-03-03, MRN [id number]  PCP:  Everrett Coombe, DO    HeartCare Providers Cardiologist:  Chrystie Nose, MD Electrophysiologist:  Regan Lemming, MD      Visit Details   VS:  There were no vitals taken for this visit. , BMI There is no height or weight on file to calculate BMI.  Wt Readings from Last 3 Encounters:  12/13/22 218 lb 6.4 oz (99.1 kg)  10/07/22 213 lb (96.6 kg)  07/15/22 219 lb (99.3 kg)     Reason for visit: flecainide visit--EKG in 2 weeks per Francis Dowse PA-C and Dr. Vida Rigger performed and showed to the DOD Dr. Ladona Ridgel.  Per Dr. Ladona Ridgel, EKG unchanged and pt should continue his current regimen of flecainide 50 mg po bid and follow-up as planned with Francis Dowse PA-C on 10/11. Performed today: Vitals, EKG, and Education Changes (medications, testing, etc.) : no changes made.  Per DOD Dr. Ladona Ridgel, pt should continue current med regimen of flecainide 50 mg po bid and follow-up with Francis Dowse PA-C on 01/24/23. Length of Visit: 45 minutes    Medications Adjustments/Labs and Tests Ordered: No orders of the defined types were placed in this encounter.  No orders of the defined types were placed in this encounter.    Achilles Dunk, LPN  10/13/1599 09:32 AM

## 2023-01-03 ENCOUNTER — Encounter: Payer: Federal, State, Local not specified - PPO | Admitting: Family Medicine

## 2023-01-18 DIAGNOSIS — G4733 Obstructive sleep apnea (adult) (pediatric): Secondary | ICD-10-CM | POA: Diagnosis not present

## 2023-01-24 ENCOUNTER — Encounter: Payer: Self-pay | Admitting: Physician Assistant

## 2023-01-24 ENCOUNTER — Ambulatory Visit: Payer: Federal, State, Local not specified - PPO | Attending: Physician Assistant | Admitting: Physician Assistant

## 2023-01-24 VITALS — BP 122/68 | HR 75 | Ht 72.0 in | Wt 214.8 lb

## 2023-01-24 DIAGNOSIS — Z79899 Other long term (current) drug therapy: Secondary | ICD-10-CM | POA: Diagnosis not present

## 2023-01-24 DIAGNOSIS — I480 Paroxysmal atrial fibrillation: Secondary | ICD-10-CM

## 2023-01-24 DIAGNOSIS — Z5181 Encounter for therapeutic drug level monitoring: Secondary | ICD-10-CM | POA: Diagnosis not present

## 2023-01-24 DIAGNOSIS — I493 Ventricular premature depolarization: Secondary | ICD-10-CM

## 2023-01-24 NOTE — Patient Instructions (Signed)
Medication Instructions:   Your physician recommends that you continue on your current medications as directed. Please refer to the Current Medication list given to you today.  *If you need a refill on your cardiac medications before your next appointment, please call your pharmacy*   Lab Work:  NONE ORDERED  TODAY   If you have labs (blood work) drawn today and your tests are completely normal, you will receive your results only by: MyChart Message (if you have MyChart) OR A paper copy in the mail If you have any lab test that is abnormal or we need to change your treatment, we will call you to review the results.   Testing/Procedures: NONE ORDERED  TODAY     Follow-Up: At [facility], you and your health needs are our priority.  As part of our continuing mission to provide you with exceptional heart care, we have created designated Provider Care Teams.  These Care Teams include your primary Cardiologist (physician) and Advanced Practice Providers (APPs -  Physician Assistants and Nurse Practitioners) who all work together to provide you with the care you need, when you need it.  We recommend signing up for the patient portal called "MyChart".  Sign up information is provided on this After Visit Summary.  MyChart is used to connect with patients for Virtual Visits (Telemedicine).  Patients are able to view lab/test results, encounter notes, upcoming appointments, etc.  Non-urgent messages can be sent to your provider as well.   To learn more about what you can do with MyChart, go to [email or web address].    Your next appointment:   3 month(s)  ( CONTACT  CASSIE HALL/ ANGELINE HAMMER FOR EP SCHEDULING ISSUES )   Provider:   You may see Will Jorja Loa, MD or one of the following Advanced Practice Providers on your designated Care Team:   Francis Dowse, [place]   Other Instructions

## 2023-01-24 NOTE — Progress Notes (Signed)
Cardiology Office Note Date:  01/24/2023  Patient ID:  William Morton, William Morton [birth year], MRN [id number] PCP:  Everrett Coombe, DO  Cardiologist:  Dr. Rennis Golden Electrophysiologist: Dr. Elberta Fortis    Chief Complaint:   flecainide visit  History of Present Illness: William Morton is a 67 y.o. male with history of OSA w/CPAP, HLD, AFib, hypothyroidism, PVCs  He saw Dr. Elberta Fortis 10/09/21, having monthy episodes pf palpitations/Afib , otherwise doing well.  He would perhaps be interesting in pursuing ablation though timing at the time of this visit was not good to look at a procedure. Generally happy with rhythm control. PVCs on his EKG, planned for monitor to assess burden  Monitor with 11% PVC burden, 3% AFib burden > Flecainide increased  Saw [name], PA-C 04/25/22, feeling better, less palpitations  I saw him 12/13/22 He is doing well He works [employer] and is on his feet all of the time at work, good exertional capacity without CP or SOB He has some palpitations, about once a month an Afib episode 2-3 hours, he can tell, rates tends to be faster and he doesn't feel as well in Afib No near syncope or syncope No SOB QRS noted to steadily creep out and in review with Dr. Elberta Fortis, flecainide dose reduced Pt would be agreeable to an ablation was going to inquire with his insurance  F/u EKG SR 67bpm, RBBB  TODAY He is doing well, feels better on the lower dose of flecainide He is active, feels like he has good exertional capacity No CP Has some AFib but minimal burden, about the same maybe a little less then he had been, and right now is happy with his rhythm control No dizziness, near syncope or syncope. No SOB   AFib hx Diagnosed Jan 2017  AAD Hx Flecainide started 2018 w/pill/pocket strategy >> daily 2020  Past Medical History:  Diagnosis Date   Atrial fibrillation (HCC) 2017   Hyperlipidemia    OSA (obstructive sleep apnea)    Thyroid disease    Vitamin D deficiency      Past Surgical History:  Procedure Laterality Date   CPAP TITRATION  09/22/2015   EYE SURGERY     TONSILLECTOMY      Current Outpatient Medications  Medication Sig Dispense Refill   atorvastatin (LIPITOR) 20 MG tablet Take 1 tablet (20 mg total) by mouth daily. 90 tablet 3   cetirizine (ZYRTEC) 10 MG tablet Take 10 mg by mouth daily.     flecainide (TAMBOCOR) 50 MG tablet Take 1 tablet (50 mg total) by mouth 2 (two) times daily. Dose increase 180 tablet 2   Ibuprofen 200 MG CAPS Take 400 mg by mouth daily.     levothyroxine (SYNTHROID) 100 MCG tablet TAKE 1 TABLET BY MOUTH EVERY DAY 90 tablet 1   meloxicam (MOBIC) 15 MG tablet Take 15 mg by mouth as needed for pain.     metoprolol succinate (TOPROL-XL) 25 MG 24 hr tablet TAKE 1 TABLET BY MOUTH EVERY DAY 90 tablet 3   nystatin-triamcinolone ointment (MYCOLOG) Apply 1 application topically as needed (for skin).     No current facility-administered medications for this visit.    Allergies:   Penicillins   Social History:  The patient  reports that he has never smoked. He has never used smokeless tobacco. He reports current alcohol use. He reports that he does not use drugs.   Family History:  The patient's family history includes Arrhythmia in his father; Cancer in  his brother and father; Heart attack in his mother; Heart disease in his mother.  ROS:  Please see the history of present illness.    All other systems are reviewed and otherwise negative.   PHYSICAL EXAM:  VS:  There were no vitals taken for this visit. BMI: There is no height or weight on file to calculate BMI. Well nourished, well developed, in no acute distress HEENT: normocephalic, atraumatic Neck: no JVD, carotid bruits or masses Cardiac: RRR; no significant murmurs, no rubs, or gallops Lungs: CTA b/l, no wheezing, rhonchi or rales Abd: soft, nontender MS: no deformity or atrophy Ext: no edema Skin: warm and dry, no rash Neuro:  No gross deficits  appreciated Psych: euthymic mood, full affect    EKG:  Done today and reviewed by myself shows  SR, 70bpm, RBBB, LAD, PR , manual measured QRS 12/27/22: SR 67bpm, PR , RBBB ( ), LAD 12/13/22: SR 62bpm, PR , RBBB ( ), LAD  July 2023: monitor Predominant rhythm was sinus rhythm 3% atrial fibrillation burden Triggered episodes associated with PVCs 11.8% ventricular ectopy burden  04/22/2018: TTE Study Conclusions  - Left ventricle: The cavity size was normal. Wall thickness was    increased in a pattern of mild LVH. Systolic function was normal.    The estimated ejection fraction was in the range of 60% to 65%.    Wall motion was normal; there were no regional wall motion    abnormalities. The study is indeterminate for the evaluation of    LV diastolic function.  - Mitral valve: There was trivial regurgitation.  - Left atrium: The atrium was mildly dilated.  - Right ventricle: The cavity size was mildly dilated. Wall    thickness was normal.  - Right atrium: The atrium was mildly dilated.  - Atrial septum: No defect or patent foramen ovale was identified.  - Tricuspid valve: There was trivial regurgitation.  - Pulmonary arteries: Systolic pressure was within the normal    range. Impressions: - Normal LV EF. Mild biatrial enlargement, mild RV enlargement.   04/09/2018: EST Non-diagnostic for ischemia due to baseline ST changes Rhythm rapid afib 2 mm ST depression with stress  Poor resting HR control 111 bpm No QRS widening with stress   Recent Labs: No results found for requested labs within last 365 days.  No results found for requested labs within last 365 days.   CrCl cannot be calculated (Patient's most recent lab result is older than the maximum 21 days allowed.).   Wt Readings from Last 3 Encounters:  12/27/22 212 lb 9.6 oz (96.4 kg)  12/13/22 218 lb 6.4 oz (99.1 kg)  10/07/22 213 lb (96.6 kg)     Other studies reviewed: Additional  studies/records reviewed today include: summarized above  ASSESSMENT AND PLAN:  Paroxysmal Afib CHA2DS2Vasc is zero >> now one for age, not on a/c  2.   PVCs  On flecainide and metoprolol, intervals/QRS has stabilized on lower flecainide dose    Disposition: back in 3 months, sooner if needed.    Current medicines are reviewed at length with the patient today.  The patient did not have any concerns regarding medicines.  Norma Fredrickson, PA-C 01/24/2023 12:44 PM     [facility] [address] [place] [address] [phone] (office)  [phone] (fax)

## 2023-01-28 ENCOUNTER — Encounter: Payer: Self-pay | Admitting: Family Medicine

## 2023-01-28 ENCOUNTER — Ambulatory Visit (INDEPENDENT_AMBULATORY_CARE_PROVIDER_SITE_OTHER): Payer: Federal, State, Local not specified - PPO | Admitting: Family Medicine

## 2023-01-28 VITALS — BP 117/74 | HR 98 | Ht 72.0 in | Wt 213.0 lb

## 2023-01-28 DIAGNOSIS — Z125 Encounter for screening for malignant neoplasm of prostate: Secondary | ICD-10-CM

## 2023-01-28 DIAGNOSIS — Z0000 Encounter for general adult medical examination without abnormal findings: Secondary | ICD-10-CM

## 2023-01-28 DIAGNOSIS — E039 Hypothyroidism, unspecified: Secondary | ICD-10-CM

## 2023-01-28 DIAGNOSIS — E559 Vitamin D deficiency, unspecified: Secondary | ICD-10-CM | POA: Diagnosis not present

## 2023-01-28 DIAGNOSIS — E782 Mixed hyperlipidemia: Secondary | ICD-10-CM

## 2023-01-28 NOTE — Progress Notes (Signed)
William Morton - 67 y.o. male MRN [id number]  Date of birth: Oct 07, 1955  Subjective Chief Complaint  Patient presents with   Annual Exam    HPI William Morton is a 67 y.o. male here today for annual exam.   Reports that he is doing pretty well.   Doing well with current medications.  Continues to see cardiology.    He continues to stay pretty active. He feels that diet is pretty good.   Non-smoker.  Occasional EtOH use.   He has already had flu vaccine this year  Review of Systems  Constitutional:  Negative for chills, fever, malaise/fatigue and weight loss.  HENT:  Negative for congestion, ear pain and sore throat.   Eyes:  Negative for blurred vision, double vision and pain.  Respiratory:  Negative for cough and shortness of breath.   Cardiovascular:  Negative for chest pain and palpitations.  Gastrointestinal:  Negative for abdominal pain, blood in stool, constipation, heartburn and nausea.  Genitourinary:  Negative for dysuria and urgency.  Musculoskeletal:  Negative for joint pain and myalgias.  Neurological:  Negative for dizziness and headaches.  Endo/Heme/Allergies:  Does not bruise/bleed easily.  Psychiatric/Behavioral:  Negative for depression. The patient is not nervous/anxious and does not have insomnia.        Allergies  Allergen Reactions   Penicillins Swelling and Rash    Swelling in joints    Past Medical History:  Diagnosis Date   Atrial fibrillation (HCC) 2017   Hyperlipidemia    OSA (obstructive sleep apnea)    Thyroid disease    Vitamin D deficiency     Past Surgical History:  Procedure Laterality Date   CPAP TITRATION  09/22/2015   EYE SURGERY     TONSILLECTOMY      Social History   Socioeconomic History   Marital status: Unknown    Spouse name: Not on file   Number of children: Not on file   Years of education: Not on file   Highest education level: Some college, no degree  Occupational History   Occupation: OFFICER     Employer: [employer]  Tobacco Use   Smoking status: Never   Smokeless tobacco: Never  Substance and Sexual Activity   Alcohol use: Yes   Drug use: No   Sexual activity: Not on file  Other Topics Concern   Not on file  Social History Narrative   Not on file   Social Determinants of Health   Financial Resource Strain: Medium Risk (01/27/2023)   Overall Financial Resource Strain (CARDIA)    Difficulty of Paying Living Expenses: Somewhat hard  Food Insecurity: No Food Insecurity (01/27/2023)   Hunger Vital Sign    Worried About Running Out of Food in the Last Year: Never true    Ran Out of Food in the Last Year: Never true  Transportation Needs: No Transportation Needs (01/27/2023)   PRAPARE - Administrator, Civil Service (Medical): No    Lack of Transportation (Non-Medical): No  Physical Activity: Unknown (01/27/2023)   Exercise Vital Sign    Days of Exercise per Week: 3 days    Minutes of Exercise per Session: Not on file  Stress: No Stress Concern Present (01/27/2023)   [employer] of Occupational Health - Occupational Stress Questionnaire    Feeling of Stress : Only a little  Social Connections: Unknown (01/27/2023)   Social Connection and Isolation Panel [NHANES]    Frequency of Communication with Friends and Family: Once a week  Frequency of Social Gatherings with Friends and Family: Once a week    Attends Religious Services: Never    Database administrator or Organizations: Not on file    Attends Banker Meetings: Not on file    Marital Status: Living with partner    Family History  Problem Relation Age of Onset   Heart disease Mother    Heart attack Mother    Cancer Brother        throat CA   Arrhythmia Father    Cancer Father     Health Maintenance  Topic Date Due   Medicare Annual Wellness (AWV)  Never done   Pneumonia Vaccine 64+ Years old (1 of 1 - PCV) 10/07/2023 (Originally 09/09/2022)   Colonoscopy  10/07/2023 (Originally  04/15/2020)   COVID-19 Vaccine (4 - 2023-24 season) 03/03/2023   DTaP/Tdap/Td (2 - Td or Tdap) 03/20/2027   INFLUENZA VACCINE  Completed   Hepatitis C Screening  Completed   HIV Screening  Completed   Zoster Vaccines- Shingrix  Completed   HPV VACCINES  Aged Out     ----------------------------------------------------------------------------------------------------------------------------------------------------------------------------------------------------------------- Physical Exam BP 117/74   Pulse 98   Ht 6' (1.829 m)   Wt 213 lb (96.6 kg)   SpO2 98%   BMI 28.89 kg/m   Physical Exam Constitutional:      General: He is not in acute distress. HENT:     Head: Normocephalic and atraumatic.     Right Ear: Tympanic membrane and external ear normal.     Left Ear: Tympanic membrane and external ear normal.  Eyes:     General: No scleral icterus. Neck:     Thyroid: No thyromegaly.  Cardiovascular:     Rate and Rhythm: Normal rate and regular rhythm.     Heart sounds: Normal heart sounds.  Pulmonary:     Effort: Pulmonary effort is normal.     Breath sounds: Normal breath sounds.  Abdominal:     General: Bowel sounds are normal. There is no distension.     Palpations: Abdomen is soft.     Tenderness: There is no abdominal tenderness. There is no guarding.  Musculoskeletal:     Cervical back: Normal range of motion.  Lymphadenopathy:     Cervical: No cervical adenopathy.  Skin:    General: Skin is warm and dry.     Findings: No rash.  Neurological:     Mental Status: He is alert and oriented to person, place, and time.     Cranial Nerves: No cranial nerve deficit.     Motor: No abnormal muscle tone.  Psychiatric:        Mood and Affect: Mood normal.        Behavior: Behavior normal.      ------------------------------------------------------------------------------------------------------------------------------------------------------------------------------------------------------------------- Assessment and Plan  Well adult exam Well adult Orders Placed This Encounter  Procedures   CMP14+EGFR   CBC with Differential/Platelet   PSA   Lipid Panel With LDL/HDL Ratio   Vitamin D (25 hydroxy)   TSH + free T4  Screenings:  Per lab orders Immunizations: UTD Anticipatory guidance/Risk factor reduction:  Recommendations per AVS.    No orders of the defined types were placed in this encounter.   No follow-ups on file.    This visit occurred during the SARS-CoV-2 public health emergency.  Safety protocols were in place, including screening questions prior to the visit, additional usage of staff PPE, and extensive cleaning of exam room while observing appropriate contact time as  indicated for disinfecting solutions.

## 2023-01-28 NOTE — Assessment & Plan Note (Signed)
Well adult Orders Placed This Encounter  Procedures   CMP14+EGFR   CBC with Differential/Platelet   PSA   Lipid Panel With LDL/HDL Ratio   Vitamin D (25 hydroxy)   TSH + free T4  Screenings:  Per lab orders Immunizations: UTD Anticipatory guidance/Risk factor reduction:  Recommendations per AVS.

## 2023-02-04 ENCOUNTER — Ambulatory Visit (INDEPENDENT_AMBULATORY_CARE_PROVIDER_SITE_OTHER): Payer: Federal, State, Local not specified - PPO | Admitting: Family Medicine

## 2023-02-04 ENCOUNTER — Encounter: Payer: Self-pay | Admitting: Family Medicine

## 2023-02-04 VITALS — BP 113/66 | HR 84 | Temp 98.7°F | Ht 72.0 in | Wt 212.0 lb

## 2023-02-04 DIAGNOSIS — J329 Chronic sinusitis, unspecified: Secondary | ICD-10-CM

## 2023-02-04 DIAGNOSIS — J40 Bronchitis, not specified as acute or chronic: Secondary | ICD-10-CM | POA: Diagnosis not present

## 2023-02-04 DIAGNOSIS — Z23 Encounter for immunization: Secondary | ICD-10-CM

## 2023-02-04 LAB — POCT INFLUENZA A/B
Influenza A, POC: NEGATIVE
Influenza B, POC: NEGATIVE

## 2023-02-04 MED ORDER — DOXYCYCLINE HYCLATE 100 MG PO TABS: 100.0000 mg | ORAL_TABLET | Freq: Two times a day (BID) | ORAL | 0 refills | Status: DC

## 2023-02-04 NOTE — Patient Instructions (Addendum)
Continue with supportive care at home.   If symptoms continue to progress and worsen go ahead and add doxycycline.

## 2023-02-04 NOTE — Assessment & Plan Note (Signed)
Home COVID negative.  POC Flu negative.  Continue with supportive care at home.  Doxycycline if having continued worsening of symptoms.  Given instruction on when to start.   Contact clinic if continuing to worsen.

## 2023-02-04 NOTE — Progress Notes (Signed)
William Morton - 67 y.o. male MRN [id number]  Date of birth: 03/01/1956  Subjective Chief Complaint  Patient presents with   URI    HPI William Morton is a a 67 y.o. male here today with complaint of fatigue, cough, congestion, body aches and chills.  Symptoms started about 3-4 days ago.  He feels like it is staring to move into his chest.  Denies dyspnea or chest pain.  Doesn't think he has had fever.  He is using OTC Theraflu with some relief.  He does work for [employer] at [employer] and is exposed to several people on a daily basis.    ROS:  A comprehensive ROS was completed and negative except as noted per HPI  Allergies  Allergen Reactions   Penicillins Swelling and Rash    Swelling in joints    Past Medical History:  Diagnosis Date   Atrial fibrillation (HCC) 2017   Hyperlipidemia    OSA (obstructive sleep apnea)    Thyroid disease    Vitamin D deficiency     Past Surgical History:  Procedure Laterality Date   CPAP TITRATION  09/22/2015   EYE SURGERY     TONSILLECTOMY      Social History   Socioeconomic History   Marital status: Unknown    Spouse name: Not on file   Number of children: Not on file   Years of education: Not on file   Highest education level: Some college, no degree  Occupational History   Occupation: OFFICER    Employer: [employer]  Tobacco Use   Smoking status: Never   Smokeless tobacco: Never  Substance and Sexual Activity   Alcohol use: Yes   Drug use: No   Sexual activity: Not on file  Other Topics Concern   Not on file  Social History Narrative   Not on file   Social Determinants of Health   Financial Resource Strain: Medium Risk (01/27/2023)   Overall Financial Resource Strain (CARDIA)    Difficulty of Paying Living Expenses: Somewhat hard  Food Insecurity: No Food Insecurity (01/27/2023)   Hunger Vital Sign    Worried About Running Out of Food in the Last Year: Never true    Ran Out of Food in the Last Year: Never true   Transportation Needs: No Transportation Needs (01/27/2023)   PRAPARE - Administrator, Civil Service (Medical): No    Lack of Transportation (Non-Medical): No  Physical Activity: Unknown (01/27/2023)   Exercise Vital Sign    Days of Exercise per Week: 3 days    Minutes of Exercise per Session: Not on file  Stress: No Stress Concern Present (01/27/2023)   [employer] of Occupational Health - Occupational Stress Questionnaire    Feeling of Stress : Only a little  Social Connections: Unknown (01/27/2023)   Social Connection and Isolation Panel [NHANES]    Frequency of Communication with Friends and Family: Once a week    Frequency of Social Gatherings with Friends and Family: Once a week    Attends Religious Services: Never    Diplomatic Services operational officer: Not on file    Attends Banker Meetings: Not on file    Marital Status: Living with partner    Family History  Problem Relation Age of Onset   Heart disease Mother    Heart attack Mother    Cancer Brother        throat CA   Arrhythmia Father    Cancer Father  Health Maintenance  Topic Date Due   Medicare Annual Wellness (AWV)  Never done   Pneumonia Vaccine 19+ Years old (1 of 1 - PCV) 10/07/2023 (Originally 09/09/2022)   Colonoscopy  10/07/2023 (Originally 04/15/2020)   COVID-19 Vaccine (4 - 2023-24 season) 03/03/2023   DTaP/Tdap/Td (2 - Td or Tdap) 03/20/2027   INFLUENZA VACCINE  Completed   Hepatitis C Screening  Completed   HIV Screening  Completed   Zoster Vaccines- Shingrix  Completed   HPV VACCINES  Aged Out     ----------------------------------------------------------------------------------------------------------------------------------------------------------------------------------------------------------------- Physical Exam BP 113/66 (BP Location: Left Arm, Patient Position: Sitting, Cuff Size: Normal)   Pulse 84   Temp 98.7 F (37.1 C) (Oral)   Ht 6' (1.829  m)   Wt 212 lb (96.2 kg)   SpO2 98%   BMI 28.75 kg/m   Physical Exam Constitutional:      Appearance: Normal appearance.  HENT:     Right Ear: Tympanic membrane normal.     Left Ear: Tympanic membrane normal.  Eyes:     General: No scleral icterus. Cardiovascular:     Rate and Rhythm: Normal rate and regular rhythm.  Pulmonary:     Effort: Pulmonary effort is normal.     Breath sounds: Normal breath sounds.  Neurological:     Mental Status: He is alert.  Psychiatric:        Mood and Affect: Mood normal.        Behavior: Behavior normal.     ------------------------------------------------------------------------------------------------------------------------------------------------------------------------------------------------------------------- Assessment and Plan  Sinobronchitis Home COVID negative.  POC Flu negative.  Continue with supportive care at home.  Doxycycline if having continued worsening of symptoms.  Given instruction on when to start.   Contact clinic if continuing to worsen.    Meds ordered this encounter  Medications   doxycycline (VIBRA-TABS) 100 MG tablet    Sig: Take 1 tablet (100 mg total) by mouth 2 (two) times daily.    Dispense:  20 tablet    Refill:  0    No follow-ups on file.    This visit occurred during the SARS-CoV-2 public health emergency.  Safety protocols were in place, including screening questions prior to the visit, additional usage of staff PPE, and extensive cleaning of exam room while observing appropriate contact time as indicated for disinfecting solutions.

## 2023-04-28 NOTE — Progress Notes (Signed)
 Electrophysiology Office Note:   Date:  04/29/2023  ID:  William Morton, DOB 1955-12-03, MRN [id number]  Primary Cardiologist: Vinie JAYSON Maxcy, MD Primary Heart Failure: None Electrophysiologist: Will Gladis Norton, MD      History of Present Illness:   William Morton is a 68 y.o. male with h/o AF, PVC's, HLD, OSA on CPAP, hypothyroidism seen today for routine electrophysiology followup.   Since last being seen in our clinic the patient reports doing really well. [place].  He reports he monitors for AF with an Apple Watch > may have an episode every 3-4 months that lasts not more than 1-3 hours then it resets.  He has discussed ablation in the past and is concerned with ou of pocket cost. No issues with flecainide  therapy currently.   He denies chest pain, palpitations, dyspnea, PND, orthopnea, nausea, vomiting, dizziness, syncope, edema, weight gain, or early satiety.   Review of systems complete and found to be negative unless listed in HPI.   EP Information / Studies Reviewed:    EKG is ordered today. Personal review as below.    NSR 64 bpm, RBBB, LAD, PR 180 ms, QRS 156 ms    Studies:  EST 03/2018 > non-diagnostic for ischemia due to baseline ST changes, rapid AF, 2mm ST depression with stress, poor resting HR control, no QRS widening with stess  ECHO 04/2018 > LVEF 60-65%, mild LVH, no RWMA, LA/RA mildly dilated  Cardiac Monitor 10/2021 > predominant NSR, 3% atrial fibrillation burden, triggered episodes associated with PVC's, PVC burden 11.8% burden   Arrhythmia / AAD AF > Dx 04/2015 Flecainide  > started 2018 with pill in pocket strategy, then daily in 2020   Risk Assessment/Calculations:    CHA2DS2-VASc Score = 1   This indicates a 0.6% annual risk of stroke. The patient's score is based upon: CHF History: 0 HTN History: 0 Diabetes History: 0 Stroke History: 0 Vascular Disease History: 0 Age Score: 1 Gender Score: 0               Physical Exam:   VS:  BP 136/82   Pulse 73   Ht 6' (1.829 m)   Wt 220 lb 9.6 oz (100.1 kg)   SpO2 95%   BMI 29.92 kg/m    Wt Readings from Last 3 Encounters:  04/29/23 220 lb 9.6 oz (100.1 kg)  02/04/23 212 lb (96.2 kg)  01/28/23 213 lb (96.6 kg)     GEN: Well nourished, well developed in no acute distress NECK: No JVD; No carotid bruits CARDIAC: Regular rate and rhythm, no murmurs, rubs, gallops RESPIRATORY:  Clear to auscultation without rales, wheezing or rhonchi  ABDOMEN: Soft, non-tender, non-distended EXTREMITIES:  No edema; No deformity   ASSESSMENT AND PLAN:    Paroxysmal AF  CHA2DS2-VASc 1 / 0.6%  -continue flecainide  + metoprolol   -AF risk factors reviewed with patient   -monitors with an Apple Watch -he is happy with control currently, discussed ablation again with patient and will seek out pre-certification for him to help determine cost -not on OAC > discussed his annual risk of CVA as above, risks of bleeding on OAC (~ 3% annually), currently elects to hold on OAC which is reasonable with his control. Discussed possible Watchman with him as well.  PVC's  -flecainide  + metoprolol  as above   Follow up with EP APP  4 months for flecainide , EKG review.    Signed, Daphne Barrack, MSN, APRN, NP-C, AGACNP-BC [facility] - Electrophysiology  04/29/2023, 4:49 PM

## 2023-04-29 ENCOUNTER — Encounter: Payer: Self-pay | Admitting: Pulmonary Disease

## 2023-04-29 ENCOUNTER — Ambulatory Visit: Payer: Medicare Other | Admitting: Physician Assistant

## 2023-04-29 ENCOUNTER — Ambulatory Visit: Payer: Federal, State, Local not specified - PPO | Attending: Physician Assistant | Admitting: Pulmonary Disease

## 2023-04-29 VITALS — BP 136/82 | HR 73 | Ht 72.0 in | Wt 220.6 lb

## 2023-04-29 DIAGNOSIS — I480 Paroxysmal atrial fibrillation: Secondary | ICD-10-CM

## 2023-04-29 DIAGNOSIS — Z79899 Other long term (current) drug therapy: Secondary | ICD-10-CM | POA: Diagnosis not present

## 2023-04-29 DIAGNOSIS — I493 Ventricular premature depolarization: Secondary | ICD-10-CM | POA: Diagnosis not present

## 2023-04-29 DIAGNOSIS — Z5181 Encounter for therapeutic drug level monitoring: Secondary | ICD-10-CM

## 2023-04-29 NOTE — Patient Instructions (Addendum)
 Medication Instructions:  Your physician recommends that you continue on your current medications as directed. Please refer to the Current Medication list given to you today.  *If you need a refill on your cardiac medications before your next appointment, please call your pharmacy*  Lab Work: None ordered.  If you have labs (blood work) drawn today and your tests are completely normal, you will receive your results only by: MyChart Message (if you have MyChart) OR A paper copy in the mail If you have any lab test that is abnormal or we need to change your treatment, we will call you to review the results.  Testing/Procedures: None ordered.  Follow-Up: At [facility], you and your health needs are our priority.  As part of our continuing mission to provide you with exceptional heart care, we have created designated Provider Care Teams.  These Care Teams include your primary Cardiologist (physician) and Advanced Practice Providers (APPs -  Physician Assistants and Nurse Practitioners) who all work together to provide you with the care you need, when you need it.  CPT Code: 06343  Your next appointment:   4 months  The format for your next appointment:   In Person  Provider:   Leotis Barrack, NP   Important Information About Sugar

## 2023-07-29 ENCOUNTER — Ambulatory Visit (INDEPENDENT_AMBULATORY_CARE_PROVIDER_SITE_OTHER): Payer: Medicare Other | Admitting: Family Medicine

## 2023-07-29 ENCOUNTER — Encounter: Payer: Self-pay | Admitting: Family Medicine

## 2023-07-29 VITALS — BP 110/63 | HR 59 | Ht 72.0 in | Wt 219.0 lb

## 2023-07-29 DIAGNOSIS — Z23 Encounter for immunization: Secondary | ICD-10-CM

## 2023-07-29 DIAGNOSIS — Z1211 Encounter for screening for malignant neoplasm of colon: Secondary | ICD-10-CM

## 2023-07-29 DIAGNOSIS — E039 Hypothyroidism, unspecified: Secondary | ICD-10-CM | POA: Diagnosis not present

## 2023-07-29 DIAGNOSIS — I480 Paroxysmal atrial fibrillation: Secondary | ICD-10-CM | POA: Diagnosis not present

## 2023-07-29 DIAGNOSIS — E782 Mixed hyperlipidemia: Secondary | ICD-10-CM

## 2023-07-29 NOTE — Assessment & Plan Note (Signed)
 Lab Results  Component Value Date   LDLCALC 56 12/28/2021  Continue atorvastatin at current strength. Updated lipids ordered.

## 2023-07-29 NOTE — Assessment & Plan Note (Signed)
Doing well with current strength of levothyroxine.  Update TSH

## 2023-07-29 NOTE — Assessment & Plan Note (Signed)
Doing well with current medications.  Continue management per cardiology.

## 2023-07-29 NOTE — Progress Notes (Signed)
 William Morton - 68 y.o. male MRN [id number]  Date of birth: Sep 11, 1955  Subjective Chief Complaint  Patient presents with   Hypertension   Hyperlipidemia    HPI William Morton is a 68 y.o. male here today for follow up visit.   Overall doing pretty well.  Still complains of some mild fatigue.  Has history of A. Fib and on beta blocker.  Also with history of hypothyroidism. Did not have labs completed at visit in October.   BP is well controlled with current strength of toprol xl.  He has not had increased palpitations, headache, shortness of breath, or chest pain.    Tolerating atorvastatin well.  Due for updated lipids.   ROS:  A comprehensive ROS was completed and negative except as noted per HPI    Past Medical History:  Diagnosis Date   Atrial fibrillation (HCC) 2017   Hyperlipidemia    OSA (obstructive sleep apnea)    Thyroid disease    Vitamin D deficiency     Past Surgical History:  Procedure Laterality Date   CPAP TITRATION  09/22/2015   EYE SURGERY     TONSILLECTOMY      Social History   Socioeconomic History   Marital status: Unknown    Spouse name: Not on file   Number of children: Not on file   Years of education: Not on file   Highest education level: Some college, no degree  Occupational History   Occupation: OFFICER    Employer: [employer]  Tobacco Use   Smoking status: Never   Smokeless tobacco: Never  Substance and Sexual Activity   Alcohol use: Yes   Drug use: No   Sexual activity: Not on file  Other Topics Concern   Not on file  Social History Narrative   Not on file   Social Drivers of Health   Financial Resource Strain: Low Risk  (07/28/2023)   Overall Financial Resource Strain (CARDIA)    Difficulty of Paying Living Expenses: Not very hard  Food Insecurity: No Food Insecurity (07/28/2023)   Hunger Vital Sign    Worried About Running Out of Food in the Last Year: Never true    Ran Out of Food in the Last Year: Never true   Transportation Needs: No Transportation Needs (07/28/2023)   PRAPARE - Administrator, Civil Service (Medical): No    Lack of Transportation (Non-Medical): No  Physical Activity: Insufficiently Active (07/28/2023)   Exercise Vital Sign    Days of Exercise per Week: 5 days    Minutes of Exercise per Session: 20 min  Stress: No Stress Concern Present (07/28/2023)   [employer] of Occupational Health - Occupational Stress Questionnaire    Feeling of Stress : Only a little  Social Connections: Socially Isolated (07/28/2023)   Social Connection and Isolation Panel [NHANES]    Frequency of Communication with Friends and Family: Once a week    Frequency of Social Gatherings with Friends and Family: Once a week    Attends Religious Services: Never    Database administrator or Organizations: No    Attends Engineer, structural: Not on file    Marital Status: Divorced    Family History  Problem Relation Age of Onset   Heart disease Mother    Heart attack Mother    Cancer Brother        throat CA   Arrhythmia Father    Cancer Father     Health Maintenance  Topic  Date Due   Medicare Annual Wellness (AWV)  Never done   COVID-19 Vaccine (4 - Mixed Product risk 2024-25 season) 07/06/2023   Pneumonia Vaccine 53+ Years old (1 of 1 - PCV) 10/07/2023 (Originally 09/09/2007)   Colonoscopy  10/07/2023 (Originally 04/15/2020)   INFLUENZA VACCINE  11/14/2023   DTaP/Tdap/Td (2 - Td or Tdap) 03/20/2027   Hepatitis C Screening  Completed   HIV Screening  Completed   Zoster Vaccines- Shingrix  Completed   HPV VACCINES  Aged Out   Meningococcal B Vaccine  Aged Out     ----------------------------------------------------------------------------------------------------------------------------------------------------------------------------------------------------------------- Physical Exam BP 110/63 (BP Location: Left Arm, Patient Position: Sitting, Cuff Size: Large)   Pulse  (!) 59   Ht 6' (1.829 m)   Wt 219 lb (99.3 kg)   SpO2 98%   BMI 29.70 kg/m   Physical Exam Constitutional:      Appearance: Normal appearance.  Eyes:     General: No scleral icterus. Cardiovascular:     Rate and Rhythm: Normal rate and regular rhythm.  Pulmonary:     Effort: Pulmonary effort is normal.     Breath sounds: Normal breath sounds.  Musculoskeletal:     Cervical back: Neck supple.  Neurological:     Mental Status: He is alert.  Psychiatric:        Mood and Affect: Mood normal.        Behavior: Behavior normal.     ------------------------------------------------------------------------------------------------------------------------------------------------------------------------------------------------------------------- Assessment and Plan  Hypothyroid Doing well with current strength of levothyroxine.  Update TSH  HLD (hyperlipidemia) Lab Results  Component Value Date   LDLCALC 56 12/28/2021  Continue atorvastatin at current strength. Updated lipids ordered.   Atrial fibrillation [facility]) Doing well with current medications.  Continue management per cardiology.     No orders of the defined types were placed in this encounter.   Return in about 6 months (around 01/28/2024) for Hypertension, thyroid.

## 2023-07-30 LAB — CMP14+EGFR
ALT: 15 IU/L (ref 0–44)
AST: 26 IU/L (ref 0–40)
Albumin: 4.5 g/dL (ref 3.9–4.9)
Alkaline Phosphatase: 65 IU/L (ref 44–121)
BUN/Creatinine Ratio: 12 (ref 10–24)
BUN: 13 mg/dL (ref 8–27)
Bilirubin Total: 0.9 mg/dL (ref 0.0–1.2)
CO2: 24 mmol/L (ref 20–29)
Calcium: 9.5 mg/dL (ref 8.6–10.2)
Chloride: 101 mmol/L (ref 96–106)
Creatinine, Ser: 1.05 mg/dL (ref 0.76–1.27)
Globulin, Total: 2.5 g/dL (ref 1.5–4.5)
Glucose: 74 mg/dL (ref 70–99)
Potassium: 4.4 mmol/L (ref 3.5–5.2)
Sodium: 140 mmol/L (ref 134–144)
Total Protein: 7 g/dL (ref 6.0–8.5)
eGFR: 79 mL/min/{1.73_m2} (ref >59)

## 2023-07-30 LAB — CBC WITH DIFFERENTIAL/PLATELET
Basophils Absolute: 0.1 10*3/uL (ref 0.0–0.2)
Basos: 1 %
EOS (ABSOLUTE): 0.3 10*3/uL (ref 0.0–0.4)
Eos: 4 %
Hematocrit: 44.9 % (ref 37.5–51.0)
Hemoglobin: 15.1 g/dL (ref 13.0–17.7)
Immature Grans (Abs): 0 10*3/uL (ref 0.0–0.1)
Immature Granulocytes: 0 %
Lymphocytes Absolute: 2.1 10*3/uL (ref 0.7–3.1)
Lymphs: 28 %
MCH: 32 pg (ref 26.6–33.0)
MCHC: 33.6 g/dL (ref 31.5–35.7)
MCV: 95 fL (ref 79–97)
Monocytes Absolute: 0.8 10*3/uL (ref 0.1–0.9)
Monocytes: 10 %
Neutrophils Absolute: 4.3 10*3/uL (ref 1.4–7.0)
Neutrophils: 57 %
Platelets: 203 10*3/uL (ref 150–450)
RBC: 4.72 x10E6/uL (ref 4.14–5.80)
RDW: 12.1 % (ref 11.6–15.4)
WBC: 7.6 10*3/uL (ref 3.4–10.8)

## 2023-07-30 LAB — TSH+FREE T4
Free T4: 1.37 ng/dL (ref 0.82–1.77)
TSH: 1.91 u[IU]/mL (ref 0.450–4.500)

## 2023-07-30 LAB — PSA: Prostate Specific Ag, Serum: 1.5 ng/mL (ref 0.0–4.0)

## 2023-07-30 LAB — LIPID PANEL WITH LDL/HDL RATIO
Cholesterol, Total: 168 mg/dL (ref 100–199)
HDL: 31 mg/dL — ABNORMAL LOW (ref >39)
LDL Chol Calc (NIH): 115 mg/dL — ABNORMAL HIGH (ref 0–99)
LDL/HDL Ratio: 3.7 ratio — ABNORMAL HIGH (ref 0.0–3.6)
Triglycerides: 120 mg/dL (ref 0–149)
VLDL Cholesterol Cal: 22 mg/dL (ref 5–40)

## 2023-07-30 LAB — VITAMIN D 25 HYDROXY (VIT D DEFICIENCY, FRACTURES): Vit D, 25-Hydroxy: 20.5 ng/mL — ABNORMAL LOW (ref 30.0–100.0)

## 2023-08-12 ENCOUNTER — Encounter: Payer: Self-pay | Admitting: Family Medicine

## 2023-08-15 LAB — COLOGUARD

## 2023-09-08 ENCOUNTER — Other Ambulatory Visit: Payer: Self-pay | Admitting: Physician Assistant

## 2023-10-29 ENCOUNTER — Ambulatory Visit: Attending: Physician Assistant | Admitting: Physician Assistant

## 2023-10-29 VITALS — BP 113/64 | HR 70 | Ht 72.0 in | Wt 221.0 lb

## 2023-10-29 DIAGNOSIS — I480 Paroxysmal atrial fibrillation: Secondary | ICD-10-CM | POA: Diagnosis not present

## 2023-10-29 DIAGNOSIS — Z79899 Other long term (current) drug therapy: Secondary | ICD-10-CM

## 2023-10-29 DIAGNOSIS — Z5181 Encounter for therapeutic drug level monitoring: Secondary | ICD-10-CM

## 2023-10-29 DIAGNOSIS — I493 Ventricular premature depolarization: Secondary | ICD-10-CM

## 2023-10-29 NOTE — Patient Instructions (Signed)
 Medication Instructions:   Your physician recommends that you continue on your current medications as directed. Please refer to the Current Medication list given to you today.   *If you need a refill on your cardiac medications before your next appointment, please call your pharmacy*  Lab Work: NONE ORDERED  TODAY   If you have labs (blood work) drawn today and your tests are completely normal, you will receive your results only by: MyChart Message (if you have MyChart) OR A paper copy in the mail If you have any lab test that is abnormal or we need to change your treatment, we will call you to review the results.  Testing/Procedures: NONE ORDERED  TODAY   Follow-Up: At [facility], you and your health needs are our priority.  As part of our continuing mission to provide you with exceptional heart care, our providers are all part of one team.  This team includes your primary Cardiologist (physician) and Advanced Practice Providers or APPs (Physician Assistants and Nurse Practitioners) who all work together to provide you with the care you need, when you need it.  Your next appointment:    4 month(s) ( CONTACT  CASSIE HALL/ ANGELINE HAMMER FOR EP SCHEDULING ISSUES )   Provider:   You may see Will Cortland Ding, MD or one of the following Advanced Practice Providers on your designated Care Team:   Mertha Abrahams, [place]    We recommend signing up for the patient portal called "MyChart".  Sign up information is provided on this After Visit Summary.  MyChart is used to connect with patients for Virtual Visits (Telemedicine).  Patients are able to view lab/test results, encounter notes, upcoming appointments, etc.  Non-urgent messages can be sent to your provider as well.   To learn more about what you can do with MyChart, go to [email or web address].   Other Instructions

## 2023-10-29 NOTE — Progress Notes (Signed)
 Cardiology Office Note Date:  10/29/2023  Patient ID:  William Morton, William Morton 10-01-1955, MRN [id number] PCP:  Alvia Bring, DO  Cardiologist:  Dr. Mona Electrophysiologist: Dr. Inocencio    Chief Complaint:   flecainide  visit  History of Present Illness: William Morton is a 68 y.o. male with history of OSA w/CPAP, HLD, AFib, hypothyroidism, PVCs  He saw Dr. Inocencio 10/09/21, having monthy episodes pf palpitations/Afib , otherwise doing well.  He would perhaps be interesting in pursuing ablation though timing at the time of this visit was not good to look at a procedure. Generally happy with rhythm control. PVCs on his EKG, planned for monitor to assess burden  Monitor with 11% PVC burden, 3% AFib burden > Flecainide  increased  Saw [name], PA-C 04/25/22, feeling better, less palpitations  I saw him 12/13/22 He is doing well He works [employer] and is on his feet all of the time at work, good exertional capacity without CP or SOB He has some palpitations, about once a month an Afib episode 2-3 hours, he can tell, rates tends to be faster and he doesn't feel as well in Afib No near syncope or syncope No SOB QRS noted to steadily creep out and in review with Dr. Inocencio, flecainide  dose reduced Pt would be agreeable to an ablation was going to inquire with his insurance  F/u EKG SR 67bpm, RBBB  I saw him 01/24/23:  He is doing well, feels better on the lower dose of flecainide  He is active, feels like he has good exertional capacity No CP Has some AFib but minimal burden Stabilized intervals  He saw Daphne 04/29/23[place], 2-3 episodes a month, generally brief <3hours Had wanted to avoid procedures with concerns of copay/cost. Low risk score, elected to stay off/not start OAC  TODAY  He is doing well Work has gotten very busy with some additional stressors, otherwise doing well Gets on average 2 miles of walking in at work ([place]) No  CP, SOB, DOE He has not has any palpitations, cardiac awareness No dizzy spells near syncope or syncope  AFib hx Diagnosed Jan 2017  AAD Hx Flecainide  started 2018 w/pill/pocket strategy >> daily 2020  Past Medical History:  Diagnosis Date   Atrial fibrillation (HCC) 2017   Hyperlipidemia    OSA (obstructive sleep apnea)    Thyroid  disease    Vitamin D  deficiency     Past Surgical History:  Procedure Laterality Date   CPAP TITRATION  09/22/2015   EYE SURGERY     TONSILLECTOMY      Current Outpatient Medications  Medication Sig Dispense Refill   atorvastatin  (LIPITOR) 20 MG tablet Take 1 tablet (20 mg total) by mouth daily. 90 tablet 3   cetirizine (ZYRTEC) 10 MG tablet Take 10 mg by mouth daily.     flecainide  (TAMBOCOR ) 50 MG tablet TAKE 1 TABLET BY MOUTH TWICE A DAY *DOSE INCREASE* 180 tablet 2   Ibuprofen  200 MG CAPS Take 400 mg by mouth as needed.     levothyroxine  (SYNTHROID ) 100 MCG tablet TAKE 1 TABLET BY MOUTH EVERY DAY 90 tablet 1   metoprolol  succinate (TOPROL -XL) 25 MG 24 hr tablet TAKE 1 TABLET BY MOUTH EVERY DAY 90 tablet 3   nystatin -triamcinolone  ointment (MYCOLOG) Apply 1 application topically as needed (for skin).     No current facility-administered medications for this visit.    Allergies:   Penicillins   Social History:  The patient  reports that he has never smoked. He has never used smokeless tobacco. He reports current alcohol use. He reports that he does not use drugs.   Family History:  The patient's family history includes Arrhythmia in his father; Cancer in his brother and father; Heart attack in his mother; Heart disease in his mother.  ROS:  Please see the history of present illness.    All other systems are reviewed and otherwise negative.   PHYSICAL EXAM:  VS:  There were no vitals taken for this visit. BMI: There is no height or weight on file to calculate BMI. Well nourished, well developed, in no acute distress HEENT: normocephalic,  atraumatic Neck: no JVD, carotid bruits or masses Cardiac: RRR; no significant murmurs, no rubs, or gallops Lungs:CTA b/l, no wheezing, rhonchi or rales Abd: soft, nontender MS: no deformity or atrophy Ext: no edema Skin: warm and dry, no rash Neuro:  No gross deficits appreciated Psych: euthymic mood, full affect    EKG:  Done today and reviewed by myself shows  SR 70bpm,  RBBB, LAD, PR , QRS , QTc , no PVCs  01/24/23: SR, 70bpm, RBBB, LAD, PR , manual measured QRS 12/27/22: SR 67bpm, PR , RBBB ( ), LAD 12/13/22: SR 62bpm, PR , RBBB ( ), LAD  July 2023: monitor Predominant rhythm was sinus rhythm 3% atrial fibrillation burden Triggered episodes associated with PVCs 11.8% ventricular ectopy burden  04/22/2018: TTE Study Conclusions  - Left ventricle: The cavity size was normal. Wall thickness was    increased in a pattern of mild LVH. Systolic function was normal.    The estimated ejection fraction was in the range of 60% to 65%.    Wall motion was normal; there were no regional wall motion    abnormalities. The study is indeterminate for the evaluation of    LV diastolic function.  - Mitral valve: There was trivial regurgitation.  - Left atrium: The atrium was mildly dilated.  - Right ventricle: The cavity size was mildly dilated. Wall    thickness was normal.  - Right atrium: The atrium was mildly dilated.  - Atrial septum: No defect or patent foramen ovale was identified.  - Tricuspid valve: There was trivial regurgitation.  - Pulmonary arteries: Systolic pressure was within the normal    range. Impressions: - Normal LV EF. Mild biatrial enlargement, mild RV enlargement.   04/09/2018: EST Non-diagnostic for ischemia due to baseline ST changes Rhythm rapid afib 2 mm ST depression with stress  Poor resting HR control 111 bpm No QRS widening with stress   Recent Labs: 07/29/2023: ALT 15; BUN 13; Creatinine, Ser 1.05;  Hemoglobin 15.1; Platelets 203; Potassium 4.4; Sodium 140; TSH 1.910  07/29/2023: Cholesterol, Total 168; HDL 31; LDL Chol Calc (NIH) 115; Triglycerides 120   CrCl cannot be calculated (Patient's most recent lab result is older than the maximum 21 days allowed.).   Wt Readings from Last 3 Encounters:  07/29/23 219 lb (99.3 kg)  04/29/23 220 lb 9.6 oz (100.1 kg)  02/04/23 212 lb (96.2 kg)     Other studies reviewed: Additional studies/records reviewed today include: summarized above  ASSESSMENT AND PLAN:  Paroxysmal Afib CHA2DS2Vasc is zero >> now one for age, not on a/c  2.   PVCs Non e on EKG or exam On flecainide  and metoprolol  Chronic RBBB/LAD stable intervals    Disposition: back in 4 months, sooner if needed.    Current medicines are reviewed at length with the patient today.  The patient did not have any concerns regarding medicines.  Bonney Charlies Arthur, PA-C 10/29/2023 7:37 AM     [facility] [address] [place] [address] [phone] (office)  [phone] (fax)

## 2023-11-20 ENCOUNTER — Other Ambulatory Visit: Payer: Self-pay | Admitting: Cardiology

## 2024-01-28 ENCOUNTER — Encounter: Payer: Self-pay | Admitting: Family Medicine

## 2024-01-28 ENCOUNTER — Ambulatory Visit: Admitting: Family Medicine

## 2024-01-28 VITALS — BP 101/67 | HR 71 | Ht 72.0 in | Wt 219.0 lb

## 2024-01-28 DIAGNOSIS — E782 Mixed hyperlipidemia: Secondary | ICD-10-CM | POA: Diagnosis not present

## 2024-01-28 DIAGNOSIS — I480 Paroxysmal atrial fibrillation: Secondary | ICD-10-CM | POA: Diagnosis not present

## 2024-01-28 DIAGNOSIS — E039 Hypothyroidism, unspecified: Secondary | ICD-10-CM

## 2024-01-28 DIAGNOSIS — Z23 Encounter for immunization: Secondary | ICD-10-CM

## 2024-01-28 MED ORDER — LEVOTHYROXINE SODIUM 100 MCG PO TABS: 100.0000 ug | ORAL_TABLET | Freq: Every day | ORAL | 1 refills | Status: AC

## 2024-01-28 MED ORDER — ATORVASTATIN CALCIUM 20 MG PO TABS
20.0000 mg | ORAL_TABLET | Freq: Every day | ORAL | 3 refills | Status: AC
Start: 2024-01-28 — End: 2024-04-27

## 2024-01-28 NOTE — Assessment & Plan Note (Signed)
 Has not been taking thyroid  medication.  Recommend restarting this.  Refill sent in.

## 2024-01-28 NOTE — Assessment & Plan Note (Signed)
Doing well with current medications.  Continue management per cardiology.

## 2024-01-28 NOTE — Progress Notes (Signed)
 William Morton - 68 y.o. male MRN [id number]  Date of birth: 08/06/1955  Subjective Chief Complaint  Patient presents with   Hypertension   Immunizations    HPI Darell Saputo is a 68 y.o. male here today for follow up.   He reports that he is doing ok.   He is not currently taking his thyroid  medication but plans to restart.   He continues to see cardiology for management of A. Fib. He remains on flecainide  metoprolol  at 25mg  daily.  He has had some fatigue and palpitations at times.  Denies chest pain.   Tolerating atorvastatin  well at current strength.    ROS:  A comprehensive ROS was completed and negative except as noted per HPI  Allergies  Allergen Reactions   Penicillins Swelling and Rash    Swelling in joints    Past Medical History:  Diagnosis Date   Atrial fibrillation (HCC) 2017   Hyperlipidemia    OSA (obstructive sleep apnea)    Thyroid  disease    Vitamin D  deficiency     Past Surgical History:  Procedure Laterality Date   CPAP TITRATION  09/22/2015   EYE SURGERY     TONSILLECTOMY      Social History   Socioeconomic History   Marital status: Unknown    Spouse name: Not on file   Number of children: Not on file   Years of education: Not on file   Highest education level: 12th grade  Occupational History   Occupation: OFFICER    Employer: [employer]  Tobacco Use   Smoking status: Never   Smokeless tobacco: Never  Substance and Sexual Activity   Alcohol use: Yes   Drug use: No   Sexual activity: Not on file  Other Topics Concern   Not on file  Social History Narrative   Not on file   Social Drivers of Health   Financial Resource Strain: Low Risk  (01/27/2024)   Overall Financial Resource Strain (CARDIA)    Difficulty of Paying Living Expenses: Not very hard  Food Insecurity: No Food Insecurity (01/27/2024)   Hunger Vital Sign    Worried About Running Out of Food in the Last Year: Never true    Ran Out of Food in the Last Year: Never true   Transportation Needs: No Transportation Needs (01/27/2024)   PRAPARE - Administrator, Civil Service (Medical): No    Lack of Transportation (Non-Medical): No  Physical Activity: Sufficiently Active (01/27/2024)   Exercise Vital Sign    Days of Exercise per Week: 5 days    Minutes of Exercise per Session: 60 min  Stress: No Stress Concern Present (01/27/2024)   [employer] of Occupational Health - Occupational Stress Questionnaire    Feeling of Stress: Only a little  Social Connections: Socially Isolated (01/27/2024)   Social Connection and Isolation Panel    Frequency of Communication with Friends and Family: Once a week    Frequency of Social Gatherings with Friends and Family: Once a week    Attends Religious Services: Never    Database administrator or Organizations: Yes    Attends Engineer, structural: 1 to 4 times per year    Marital Status: Divorced    Family History  Problem Relation Age of Onset   Heart disease Mother    Heart attack Mother    Cancer Brother        throat CA   Arrhythmia Father    Cancer Father  Health Maintenance  Topic Date Due   Medicare Annual Wellness (AWV)  Never done   Colonoscopy  04/15/2020   COVID-19 Vaccine (4 - 2025-26 season) 02/12/2025 (Originally 12/15/2023)   DTaP/Tdap/Td (2 - Td or Tdap) 03/20/2027   Pneumococcal Vaccine: 50+ Years  Completed   Influenza Vaccine  Completed   Hepatitis C Screening  Completed   Zoster Vaccines- Shingrix   Completed   Meningococcal B Vaccine  Aged Out     ----------------------------------------------------------------------------------------------------------------------------------------------------------------------------------------------------------------- Physical Exam BP 101/67 (BP Location: Left Arm, Patient Position: Sitting, Cuff Size: Large)   Pulse 71   Ht 6' (1.829 m)   Wt 219 lb (99.3 kg)   SpO2 98%   BMI 29.70 kg/m   Physical Exam Constitutional:       Appearance: Normal appearance.  HENT:     Head: Normocephalic and atraumatic.  Eyes:     General: No scleral icterus. Cardiovascular:     Rate and Rhythm: Normal rate and regular rhythm.  Pulmonary:     Effort: Pulmonary effort is normal.     Breath sounds: Normal breath sounds.  Musculoskeletal:     Cervical back: Neck supple.  Neurological:     Mental Status: He is alert.  Psychiatric:        Mood and Affect: Mood normal.        Behavior: Behavior normal.     ------------------------------------------------------------------------------------------------------------------------------------------------------------------------------------------------------------------- Assessment and Plan  HLD (hyperlipidemia) Tolerating atorvastatin  well at current strength.  Will plan to continue.   Atrial fibrillation [facility]) Doing well with current medications.  Continue management per cardiology.    Hypothyroid Has not been taking thyroid  medication.  Recommend restarting this.  Refill sent in.    Meds ordered this encounter  Medications   atorvastatin  (LIPITOR) 20 MG tablet    Sig: Take 1 tablet (20 mg total) by mouth daily.    Dispense:  90 tablet    Refill:  3   levothyroxine  (SYNTHROID ) 100 MCG tablet    Sig: Take 1 tablet (100 mcg total) by mouth daily.    Dispense:  90 tablet    Refill:  1    Return in about 6 months (around 07/28/2024) for HLD, Thyroid .

## 2024-01-28 NOTE — Assessment & Plan Note (Signed)
Tolerating atorvastatin well at current strength.  Will plan to continue.

## 2024-03-04 ENCOUNTER — Ambulatory Visit (INDEPENDENT_AMBULATORY_CARE_PROVIDER_SITE_OTHER): Admitting: Family Medicine

## 2024-03-04 ENCOUNTER — Encounter: Payer: Self-pay | Admitting: Family Medicine

## 2024-03-04 VITALS — BP 110/68 | HR 75 | Temp 98.0°F | Ht 72.0 in | Wt 216.0 lb

## 2024-03-04 DIAGNOSIS — R6889 Other general symptoms and signs: Secondary | ICD-10-CM

## 2024-03-04 DIAGNOSIS — J069 Acute upper respiratory infection, unspecified: Secondary | ICD-10-CM

## 2024-03-04 LAB — POC COVID19/FLU A&B COMBO
Covid Antigen, POC: NEGATIVE
Influenza A Antigen, POC: NEGATIVE
Influenza B Antigen, POC: NEGATIVE

## 2024-03-04 NOTE — Progress Notes (Signed)
 William Morton - 68 y.o. male MRN [id number]  Date of birth: 1956-02-25  Subjective Chief Complaint  Patient presents with   URI    HPI William Morton is a 68 y.o. male here today with complaint of nasal congestion, chest congestion, and fatigue.  Symptoms started about 2-3 days ago.  He has used OTC Vicks 44 and had good relief with this.  He is drinking a good amount of fluids.  He denies fever, chills, GI symptoms, dyspnea, headache.   ROS:  A comprehensive ROS was completed and negative except as noted per HPI  Allergies  Allergen Reactions   Penicillins Swelling and Rash    Swelling in joints    Past Medical History:  Diagnosis Date   Atrial fibrillation (HCC) 2017   Hyperlipidemia    OSA (obstructive sleep apnea)    Thyroid  disease    Vitamin D  deficiency     Past Surgical History:  Procedure Laterality Date   CPAP TITRATION  09/22/2015   EYE SURGERY     TONSILLECTOMY      Social History   Socioeconomic History   Marital status: Unknown    Spouse name: Not on file   Number of children: Not on file   Years of education: Not on file   Highest education level: 12th grade  Occupational History   Occupation: OFFICER    Employer: [employer]  Tobacco Use   Smoking status: Never   Smokeless tobacco: Never  Substance and Sexual Activity   Alcohol use: Yes   Drug use: No   Sexual activity: Not on file  Other Topics Concern   Not on file  Social History Narrative   Not on file   Social Drivers of Health   Financial Resource Strain: Low Risk  (01/27/2024)   Overall Financial Resource Strain (CARDIA)    Difficulty of Paying Living Expenses: Not very hard  Food Insecurity: No Food Insecurity (01/27/2024)   Hunger Vital Sign    Worried About Running Out of Food in the Last Year: Never true    Ran Out of Food in the Last Year: Never true  Transportation Needs: No Transportation Needs (01/27/2024)   PRAPARE - Administrator, Civil Service (Medical): No     Lack of Transportation (Non-Medical): No  Physical Activity: Sufficiently Active (01/27/2024)   Exercise Vital Sign    Days of Exercise per Week: 5 days    Minutes of Exercise per Session: 60 min  Stress: No Stress Concern Present (01/27/2024)   [employer] of Occupational Health - Occupational Stress Questionnaire    Feeling of Stress: Only a little  Social Connections: Socially Isolated (01/27/2024)   Social Connection and Isolation Panel    Frequency of Communication with Friends and Family: Once a week    Frequency of Social Gatherings with Friends and Family: Once a week    Attends Religious Services: Never    Database Administrator or Organizations: Yes    Attends Engineer, Structural: 1 to 4 times per year    Marital Status: Divorced    Family History  Problem Relation Age of Onset   Heart disease Mother    Heart attack Mother    Cancer Brother        throat CA   Arrhythmia Father    Cancer Father     Health Maintenance  Topic Date Due   Medicare Annual Wellness (AWV)  Never done   Colonoscopy  04/15/2020   COVID-19  Vaccine (4 - 2025-26 season) 02/12/2025 (Originally 12/15/2023)   DTaP/Tdap/Td (2 - Td or Tdap) 03/20/2027   Pneumococcal Vaccine: 50+ Years  Completed   Influenza Vaccine  Completed   Hepatitis C Screening  Completed   Zoster Vaccines- Shingrix   Completed   Meningococcal B Vaccine  Aged Out     ----------------------------------------------------------------------------------------------------------------------------------------------------------------------------------------------------------------- Physical Exam BP 110/68 (BP Location: Left Arm, Patient Position: Sitting, Cuff Size: Normal)   Pulse 75   Temp 98 F (36.7 C) (Oral)   Ht 6' (1.829 m)   Wt 216 lb (98 kg)   SpO2 98%   BMI 29.29 kg/m   Physical Exam Constitutional:      Appearance: Normal appearance.  Eyes:     General: No scleral icterus. Cardiovascular:      Rate and Rhythm: Normal rate and regular rhythm.  Pulmonary:     Effort: Pulmonary effort is normal.     Breath sounds: Normal breath sounds.  Musculoskeletal:     Cervical back: Neck Morton.  Neurological:     Mental Status: He is alert.  Psychiatric:        Mood and Affect: Mood normal.        Behavior: Behavior normal.     ------------------------------------------------------------------------------------------------------------------------------------------------------------------------------------------------------------------- Assessment and Plan  URI (upper respiratory infection)  Symptomatic and supportive therapy suggested: push fluids, rest, use vaporizer or mist prn, and return office visit prn if symptoms persist or worsen. Lack of antibiotic effectiveness discussed with him. Call or return to clinic prn if these symptoms worsen or fail to improve as anticipated.   No orders of the defined types were placed in this encounter.   No follow-ups on file.

## 2024-03-04 NOTE — Assessment & Plan Note (Signed)
  Symptomatic and supportive therapy suggested: push fluids, rest, use vaporizer or mist prn, and return office visit prn if symptoms persist or worsen. Lack of antibiotic effectiveness discussed with him. Call or return to clinic prn if these symptoms worsen or fail to improve as anticipated.

## 2024-03-04 NOTE — Patient Instructions (Signed)

## 2024-03-05 NOTE — Progress Notes (Unsigned)
 Cardiology Office Note Date:  03/05/2024  Patient ID:  William Morton, William Morton [birth year], MRN [id number] PCP:  Alvia Bring, DO  Cardiologist:  Dr. Mona Electrophysiologist: Dr. Inocencio    Chief Complaint: ***  flecainide  visit  History of Present Illness: William Morton is a 68 y.o. male with history of  OSA w/CPAP, HLD, AFib, hypothyroidism,  PVCs  He saw Dr. Inocencio 10/09/21, having monthy episodes pf palpitations/Afib , otherwise doing well.  He would perhaps be interesting in pursuing ablation though timing at the time of this visit was not good to look at a procedure. Generally happy with rhythm control. PVCs on his EKG, planned for monitor to assess burden  Monitor with 11% PVC burden, 3% AFib burden > Flecainide  increased  Saw [name], PA-C 04/25/22, feeling better, less palpitations  I saw him 12/13/22 He is doing well He works [employer] and is on his feet all of the time at work, good exertional capacity without CP or SOB He has some palpitations, about once a month an Afib episode 2-3 hours, he can tell, rates tends to be faster and he doesn't feel as well in Afib No near syncope or syncope No SOB QRS noted to steadily creep out and in review with Dr. Inocencio, flecainide  dose reduced Pt would be agreeable to an ablation was going to inquire with his insurance  F/u EKG SR 67bpm, RBBB  I saw him 01/24/23:  He is doing well, feels better on the lower dose of flecainide  He is active, feels like he has good exertional capacity No CP Has some AFib but minimal burden Stabilized intervals  He saw Daphne 04/29/23[place], 2-3 episodes a month, generally brief <3hours Had wanted to avoid procedures with concerns of copay/cost. Low risk score, elected to stay off/not start OAC  I saw him 10/29/23 He is doing well Work has gotten very busy with some additional stressors, otherwise doing well Gets on average 2 miles of walking in at  work (air[place]) No CP, SOB, DOE He has not has any palpitations, cardiac awareness No dizzy spells near syncope or syncope   TODAY  *** flecainide , nodal blocker, EKG *** symptoms, PVCs  AFib hx Diagnosed Jan 2017  AAD Hx Flecainide  started 2018 w/pill/pocket strategy >> daily 2020  Past Medical History:  Diagnosis Date   Atrial fibrillation (HCC) 2017   Hyperlipidemia    OSA (obstructive sleep apnea)    Thyroid  disease    Vitamin D  deficiency     Past Surgical History:  Procedure Laterality Date   CPAP TITRATION  09/22/2015   EYE SURGERY     TONSILLECTOMY      Current Outpatient Medications  Medication Sig Dispense Refill   atorvastatin  (LIPITOR) 20 MG tablet Take 1 tablet (20 mg total) by mouth daily. 90 tablet 3   cetirizine (ZYRTEC) 10 MG tablet Take 10 mg by mouth daily.     flecainide  (TAMBOCOR ) 50 MG tablet TAKE 1 TABLET BY MOUTH TWICE A DAY *DOSE INCREASE* 180 tablet 2   Ibuprofen  200 MG CAPS Take 400 mg by mouth as needed.     levothyroxine  (SYNTHROID ) 100 MCG tablet Take 1 tablet (100 mcg total) by mouth daily. 90 tablet 1   metoprolol  succinate (TOPROL -XL) 25 MG 24 hr tablet TAKE 1 TABLET BY MOUTH EVERY DAY 90 tablet 3   nystatin -triamcinolone  ointment (MYCOLOG) Apply 1 application topically as needed (for skin).     No current facility-administered medications  for this visit.    Allergies:   Penicillins   Social History:  The patient  reports that he has never smoked. He has never used smokeless tobacco. He reports current alcohol use. He reports that he does not use drugs.   Family History:  The patient's family history includes Arrhythmia in his father; Cancer in his brother and father; Heart attack in his mother; Heart disease in his mother.  ROS:  Please see the history of present illness.    All other systems are reviewed and otherwise negative.   PHYSICAL EXAM:  VS:  There were no vitals taken for this visit. BMI: There is no height or weight on  file to calculate BMI. Well nourished, well developed, in no acute distress HEENT: normocephalic, atraumatic Neck: no JVD, carotid bruits or masses Cardiac: *** RRR; no significant murmurs, no rubs, or gallops Lungs: *** CTA b/l, no wheezing, rhonchi or rales Abd: soft, nontender MS: no deformity or atrophy Ext: *** no edema Skin: warm and dry, no rash Neuro:  No gross deficits appreciated Psych: euthymic mood, full affect    EKG:  Done today and reviewed by myself shows  ***  10/29/23: SR 70bpm,  RBBB, LAD, PR , QRS , QTc , no PVCs 01/24/23: SR, 70bpm, RBBB, LAD, PR , manual measured QRS 12/27/22: SR 67bpm, PR , RBBB ( ), LAD 12/13/22: SR 62bpm, PR , RBBB ( ), LAD  July 2023: monitor Predominant rhythm was sinus rhythm 3% atrial fibrillation burden Triggered episodes associated with PVCs 11.8% ventricular ectopy burden  04/22/2018: TTE Study Conclusions  - Left ventricle: The cavity size was normal. Wall thickness was    increased in a pattern of mild LVH. Systolic function was normal.    The estimated ejection fraction was in the range of 60% to 65%.    Wall motion was normal; there were no regional wall motion    abnormalities. The study is indeterminate for the evaluation of    LV diastolic function.  - Mitral valve: There was trivial regurgitation.  - Left atrium: The atrium was mildly dilated.  - Right ventricle: The cavity size was mildly dilated. Wall    thickness was normal.  - Right atrium: The atrium was mildly dilated.  - Atrial septum: No defect or patent foramen ovale was identified.  - Tricuspid valve: There was trivial regurgitation.  - Pulmonary arteries: Systolic pressure was within the normal    range. Impressions: - Normal LV EF. Mild biatrial enlargement, mild RV enlargement.   04/09/2018: EST Non-diagnostic for ischemia due to baseline ST changes Rhythm rapid afib 2 mm ST depression with stress  Poor  resting HR control 111 bpm No QRS widening with stress   Recent Labs: 07/29/2023: ALT 15; BUN 13; Creatinine, Ser 1.05; Hemoglobin 15.1; Platelets 203; Potassium 4.4; Sodium 140; TSH 1.910  07/29/2023: Cholesterol, Total 168; HDL 31; LDL Chol Calc (NIH) 115; Triglycerides 120   CrCl cannot be calculated (Patient's most recent lab result is older than the maximum 21 days allowed.).   Wt Readings from Last 3 Encounters:  03/04/24 216 lb (98 kg)  01/28/24 219 lb (99.3 kg)  10/29/23 221 lb (100.2 kg)     Other studies reviewed: Additional studies/records reviewed today include: summarized above  ASSESSMENT AND PLAN:  Paroxysmal Afib CHA2DS2Vasc is zero >> now one for age, not on a/c  2.   PVCs Non e on EKG or exam On flecainide  and metoprolol  Chronic RBBB/LAD stable intervals  Disposition: back in *** 4 months, sooner if needed.    Current medicines are reviewed at length with the patient today.  The patient did not have any concerns regarding medicines.  Bonney Charlies Arthur, PA-C 03/05/2024 3:32 PM     [facility] [address] [place] [address] [phone] (office)  [phone] (fax)

## 2024-03-08 ENCOUNTER — Ambulatory Visit: Attending: Physician Assistant | Admitting: Physician Assistant

## 2024-03-08 VITALS — BP 132/76 | HR 83 | Ht 72.0 in | Wt 216.0 lb

## 2024-03-08 DIAGNOSIS — I480 Paroxysmal atrial fibrillation: Secondary | ICD-10-CM | POA: Diagnosis not present

## 2024-03-08 DIAGNOSIS — Z5181 Encounter for therapeutic drug level monitoring: Secondary | ICD-10-CM

## 2024-03-08 DIAGNOSIS — I493 Ventricular premature depolarization: Secondary | ICD-10-CM

## 2024-03-08 DIAGNOSIS — Z79899 Other long term (current) drug therapy: Secondary | ICD-10-CM | POA: Diagnosis not present

## 2024-03-08 NOTE — Patient Instructions (Signed)
 Medication Instructions:    Your physician recommends that you continue on your current medications as directed. Please refer to the Current Medication list given to you today.    *If you need a refill on your cardiac medications before your next appointment, please call your pharmacy*    Lab Work:  NONE ORDERED  TODAY     If you have labs (blood work) drawn today and your tests are completely normal, you will receive your results only by: MyChart Message (if you have MyChart) OR A paper copy in the mail If you have any lab test that is abnormal or we need to change your treatment, we will call you to review the results.   Testing/Procedures:  NONE ORDERED  TODAY      Follow-Up: At [facility], you and your health needs are our priority.  As part of our continuing mission to provide you with exceptional heart care, our providers are all part of one team.  This team includes your primary Cardiologist (physician) and Advanced Practice Providers or APPs (Physician Assistants and Nurse Practitioners) who all work together to provide you with the care you need, when you need it.  Your next appointment:    4 month(s)  Provider:   You may see designated Care Team:   Charlies Arthur, PA-C  ( CONTACT  CASSIE HALL/ ANGELINE HAMMER FOR EP SCHEDULING ISSUES )      We recommend signing up for the patient portal called MyChart.  Sign up information is provided on this After Visit Summary.  MyChart is used to connect with patients for Virtual Visits (Telemedicine).  Patients are able to view lab/test results, encounter notes, upcoming appointments, etc.  Non-urgent messages can be sent to your provider as well.   To learn more about what you can do with MyChart, go to [email or web address].   Other Instructions

## 2024-03-18 ENCOUNTER — Ambulatory Visit (INDEPENDENT_AMBULATORY_CARE_PROVIDER_SITE_OTHER): Admitting: Family Medicine

## 2024-03-18 ENCOUNTER — Encounter: Payer: Self-pay | Admitting: Family Medicine

## 2024-03-18 ENCOUNTER — Ambulatory Visit

## 2024-03-18 ENCOUNTER — Ambulatory Visit: Payer: Self-pay | Admitting: Family Medicine

## 2024-03-18 VITALS — BP 124/78 | HR 78 | Ht 72.0 in | Wt 224.0 lb

## 2024-03-18 DIAGNOSIS — H539 Unspecified visual disturbance: Secondary | ICD-10-CM

## 2024-03-18 NOTE — Assessment & Plan Note (Addendum)
?  Amaurosis. Carotid US  ordered to be done today.  Recommend starting 81mg  ASA as he does have CHADS-VASC of 1 with history of A. Fib.  Red flags reviewed.

## 2024-03-18 NOTE — Patient Instructions (Signed)
Start 81 mg aspirin daily   

## 2024-03-18 NOTE — Progress Notes (Signed)
 William Morton - 68 y.o. male MRN [id number]  Date of birth: 05-23-1955  Subjective Chief Complaint  Patient presents with   Blurred Vision    HPI William Morton is a 68 y.o. male here today with complaint of episode of blurred vision.  Reports waking thei morning and having episode of transient vision change.  This lasted for about an hour and then resolved.  He has had floaters in the past but this seemed different.  Denies headache, dizziness or other neurological symptoms.  He does have history of A. Fib with CHADS-VASC score of 1.    ROS:  A comprehensive ROS was completed and negative except as noted per HPI    Allergies  Allergen Reactions   Penicillins Swelling and Rash    Swelling in joints    Past Medical History:  Diagnosis Date   Atrial fibrillation (HCC) 2017   Hyperlipidemia    OSA (obstructive sleep apnea)    Thyroid  disease    Vitamin D  deficiency     Past Surgical History:  Procedure Laterality Date   CPAP TITRATION  09/22/2015   EYE SURGERY     TONSILLECTOMY      Social History   Socioeconomic History   Marital status: Unknown    Spouse name: Not on file   Number of children: Not on file   Years of education: Not on file   Highest education level: 12th grade  Occupational History   Occupation: OFFICER    Employer: [employer]  Tobacco Use   Smoking status: Never   Smokeless tobacco: Never  Substance and Sexual Activity   Alcohol use: Yes   Drug use: No   Sexual activity: Not on file  Other Topics Concern   Not on file  Social History Narrative   Not on file   Social Drivers of Health   Financial Resource Strain: Low Risk  (01/27/2024)   Overall Financial Resource Strain (CARDIA)    Difficulty of Paying Living Expenses: Not very hard  Food Insecurity: No Food Insecurity (01/27/2024)   Hunger Vital Sign    Worried About Running Out of Food in the Last Year: Never true    Ran Out of Food in the Last Year: Never true  Transportation Needs:  No Transportation Needs (01/27/2024)   PRAPARE - Administrator, Civil Service (Medical): No    Lack of Transportation (Non-Medical): No  Physical Activity: Sufficiently Active (01/27/2024)   Exercise Vital Sign    Days of Exercise per Week: 5 days    Minutes of Exercise per Session: 60 min  Stress: No Stress Concern Present (01/27/2024)   [employer] of Occupational Health - Occupational Stress Questionnaire    Feeling of Stress: Only a little  Social Connections: Socially Isolated (01/27/2024)   Social Connection and Isolation Panel    Frequency of Communication with Friends and Family: Once a week    Frequency of Social Gatherings with Friends and Family: Once a week    Attends Religious Services: Never    Database Administrator or Organizations: Yes    Attends Engineer, Structural: 1 to 4 times per year    Marital Status: Divorced    Family History  Problem Relation Age of Onset   Heart disease Mother    Heart attack Mother    Cancer Brother        throat CA   Arrhythmia Father    Cancer Father     Health Maintenance  Topic  Date Due   Medicare Annual Wellness (AWV)  Never done   Colonoscopy  04/15/2020   COVID-19 Vaccine (4 - 2025-26 season) 02/12/2025 (Originally 12/15/2023)   DTaP/Tdap/Td (2 - Td or Tdap) 03/20/2027   Pneumococcal Vaccine: 50+ Years  Completed   Influenza Vaccine  Completed   Hepatitis C Screening  Completed   Zoster Vaccines- Shingrix   Completed   Meningococcal B Vaccine  Aged Out     ----------------------------------------------------------------------------------------------------------------------------------------------------------------------------------------------------------------- Physical Exam BP 124/78   Pulse 78   Ht 6' (1.829 m)   Wt 224 lb (101.6 kg)   SpO2 96%   BMI 30.38 kg/m   Physical Exam Constitutional:      Appearance: Normal appearance.  Eyes:     General: No scleral icterus.     Extraocular Movements: Extraocular movements intact.     Pupils: Pupils are equal, round, and reactive to light.  Neck:     Comments: No carotid bruit heard.  Cardiovascular:     Rate and Rhythm: Normal rate and regular rhythm.  Pulmonary:     Effort: Pulmonary effort is normal.     Breath sounds: Normal breath sounds.  Neurological:     Mental Status: He is alert.  Psychiatric:        Mood and Affect: Mood normal.        Behavior: Behavior normal.     ------------------------------------------------------------------------------------------------------------------------------------------------------------------------------------------------------------------- Assessment and Plan  Transient vision disturbance of right eye ?Amaurosis. Carotid US  ordered to be done today.  Recommend starting 81mg  ASA as he does have CHADS-VASC of 1 with history of A. Fib.  Red flags reviewed.     No orders of the defined types were placed in this encounter.   No follow-ups on file.

## 2024-07-06 ENCOUNTER — Ambulatory Visit: Admitting: Physician Assistant

## 2024-07-28 ENCOUNTER — Ambulatory Visit: Admitting: Family Medicine
